# Patient Record
Sex: Male | Born: 1950
Health system: Southern US, Community
[De-identification: ages and names within clinical notes are randomized; demographics above are authoritative.]

## PROBLEM LIST (undated history)

## (undated) DIAGNOSIS — E785 Hyperlipidemia, unspecified: Secondary | ICD-10-CM

## (undated) DIAGNOSIS — Z973 Presence of spectacles and contact lenses: Secondary | ICD-10-CM

## (undated) DIAGNOSIS — K589 Irritable bowel syndrome without diarrhea: Secondary | ICD-10-CM

## (undated) DIAGNOSIS — K222 Esophageal obstruction: Secondary | ICD-10-CM

## (undated) DIAGNOSIS — K449 Diaphragmatic hernia without obstruction or gangrene: Secondary | ICD-10-CM

## (undated) DIAGNOSIS — I493 Ventricular premature depolarization: Secondary | ICD-10-CM

## (undated) DIAGNOSIS — R7303 Prediabetes: Secondary | ICD-10-CM

## (undated) DIAGNOSIS — K649 Unspecified hemorrhoids: Secondary | ICD-10-CM

## (undated) DIAGNOSIS — Z87442 Personal history of urinary calculi: Secondary | ICD-10-CM

## (undated) DIAGNOSIS — Z87898 Personal history of other specified conditions: Secondary | ICD-10-CM

## (undated) DIAGNOSIS — K219 Gastro-esophageal reflux disease without esophagitis: Secondary | ICD-10-CM

## (undated) DIAGNOSIS — H269 Unspecified cataract: Secondary | ICD-10-CM

## (undated) DIAGNOSIS — Z91018 Allergy to other foods: Secondary | ICD-10-CM

## (undated) DIAGNOSIS — I4891 Unspecified atrial fibrillation: Secondary | ICD-10-CM

## (undated) DIAGNOSIS — Z8719 Personal history of other diseases of the digestive system: Secondary | ICD-10-CM

## (undated) DIAGNOSIS — K5730 Diverticulosis of large intestine without perforation or abscess without bleeding: Secondary | ICD-10-CM

## (undated) DIAGNOSIS — I2510 Atherosclerotic heart disease of native coronary artery without angina pectoris: Secondary | ICD-10-CM

## (undated) DIAGNOSIS — Z7901 Long term (current) use of anticoagulants: Secondary | ICD-10-CM

## (undated) DIAGNOSIS — K5790 Diverticulosis of intestine, part unspecified, without perforation or abscess without bleeding: Secondary | ICD-10-CM

## (undated) HISTORY — PX: COLONOSCOPY: SHX174

## (undated) HISTORY — DX: Irritable bowel syndrome, unspecified: K58.9

## (undated) HISTORY — DX: Unspecified hemorrhoids: K64.9

## (undated) HISTORY — DX: Diaphragmatic hernia without obstruction or gangrene: K44.9

## (undated) HISTORY — DX: Unspecified cataract: H26.9

## (undated) HISTORY — PX: UPPER GASTROINTESTINAL ENDOSCOPY: SHX188

## (undated) HISTORY — DX: Ventricular premature depolarization: I49.3

## (undated) HISTORY — DX: Atherosclerotic heart disease of native coronary artery without angina pectoris: I25.10

## (undated) HISTORY — DX: Esophageal obstruction: K22.2

## (undated) HISTORY — PX: HERNIA REPAIR: SHX51

## (undated) HISTORY — DX: Gastro-esophageal reflux disease without esophagitis: K21.9

## (undated) HISTORY — DX: Prediabetes: R73.03

## (undated) HISTORY — PX: TONSILLECTOMY: SUR1361

## (undated) HISTORY — DX: Hyperlipidemia, unspecified: E78.5

## (undated) HISTORY — PX: ESOPHAGOGASTRODUODENOSCOPY (EGD) WITH ESOPHAGEAL DILATION: SHX5812

## (undated) HISTORY — PX: SURGERY SCROTAL / TESTICULAR: SUR1316

## (undated) HISTORY — DX: Diverticulosis of intestine, part unspecified, without perforation or abscess without bleeding: K57.90

## (undated) HISTORY — PX: HEMORRHOID BANDING: SHX5850

## (undated) HISTORY — PX: CYSTOSCOPY WITH STENT PLACEMENT: SHX5790

---
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
## 1999-11-21 ENCOUNTER — Encounter: Admission: RE | Admit: 1999-11-21 | Discharge: 2000-02-19 | Payer: Self-pay | Admitting: *Deleted

## 2001-02-17 ENCOUNTER — Encounter: Admission: RE | Admit: 2001-02-17 | Discharge: 2001-02-17 | Payer: Self-pay | Admitting: Gastroenterology

## 2001-02-17 ENCOUNTER — Encounter: Payer: Self-pay | Admitting: Gastroenterology

## 2001-03-30 ENCOUNTER — Ambulatory Visit (HOSPITAL_COMMUNITY): Admission: RE | Admit: 2001-03-30 | Discharge: 2001-03-30 | Payer: Self-pay | Admitting: Internal Medicine

## 2002-05-11 ENCOUNTER — Ambulatory Visit (HOSPITAL_COMMUNITY): Admission: RE | Admit: 2002-05-11 | Discharge: 2002-05-11 | Payer: Self-pay | Admitting: Gastroenterology

## 2002-05-11 ENCOUNTER — Encounter: Payer: Self-pay | Admitting: Gastroenterology

## 2003-07-12 ENCOUNTER — Ambulatory Visit (HOSPITAL_COMMUNITY): Admission: RE | Admit: 2003-07-12 | Discharge: 2003-07-12 | Payer: Self-pay | Admitting: Internal Medicine

## 2004-07-12 ENCOUNTER — Encounter (INDEPENDENT_AMBULATORY_CARE_PROVIDER_SITE_OTHER): Payer: Self-pay | Admitting: Specialist

## 2004-07-12 ENCOUNTER — Encounter (INDEPENDENT_AMBULATORY_CARE_PROVIDER_SITE_OTHER): Payer: Self-pay | Admitting: Gastroenterology

## 2004-07-12 ENCOUNTER — Ambulatory Visit (HOSPITAL_COMMUNITY): Admission: RE | Admit: 2004-07-12 | Discharge: 2004-07-12 | Payer: Self-pay | Admitting: Gastroenterology

## 2006-09-24 ENCOUNTER — Ambulatory Visit: Payer: Self-pay | Admitting: Gastroenterology

## 2008-08-04 DIAGNOSIS — K589 Irritable bowel syndrome without diarrhea: Secondary | ICD-10-CM | POA: Insufficient documentation

## 2008-08-04 DIAGNOSIS — K449 Diaphragmatic hernia without obstruction or gangrene: Secondary | ICD-10-CM | POA: Insufficient documentation

## 2008-08-04 DIAGNOSIS — K219 Gastro-esophageal reflux disease without esophagitis: Secondary | ICD-10-CM

## 2008-08-04 DIAGNOSIS — K5730 Diverticulosis of large intestine without perforation or abscess without bleeding: Secondary | ICD-10-CM | POA: Insufficient documentation

## 2008-08-04 DIAGNOSIS — K644 Residual hemorrhoidal skin tags: Secondary | ICD-10-CM | POA: Insufficient documentation

## 2008-08-04 DIAGNOSIS — F341 Dysthymic disorder: Secondary | ICD-10-CM

## 2008-08-09 ENCOUNTER — Ambulatory Visit: Payer: Self-pay | Admitting: Internal Medicine

## 2008-08-09 DIAGNOSIS — R142 Eructation: Secondary | ICD-10-CM

## 2008-08-09 DIAGNOSIS — R143 Flatulence: Secondary | ICD-10-CM

## 2008-08-09 DIAGNOSIS — R141 Gas pain: Secondary | ICD-10-CM | POA: Insufficient documentation

## 2011-03-01 NOTE — Assessment & Plan Note (Signed)
[facility]                         GASTROENTEROLOGY OFFICE NOTE   NAME:Kintzel, TYLLER BOWLBY                    MRN:          [id number]  DATE:09/24/2006                            DOB:          [birth year]    Travis [facility] comes in, basically describing some increasing constipation. He  has a known extremely tortuous, redundant colon. He has had this problem  for a long time. He talked about pain and gas up in his chest, mainly  when he eats. He denies it when he exerts himself, does not do any  exercise, but says if he walks upstairs it really does not make it  worse. I was telling him I was concerned about cardiac disease, but he  has cardiac workups in the past with adenosine scans and so on in the  past several years and we really have not detected anything significant.  He has had in the past, irritable bowel syndrome, with known food  allergies that he has had for years and he stays away from a number of  these foods. He has also has known GERD, and he has a lot of underlying  anxiety and depression.   PHYSICAL EXAMINATION:  GENERAL: He looks about the same.  VITAL SIGNS: Weighs 163, blood pressure 112/68.  Oral pharynx was negative.  NECK: Negative.  CHEST: Clear.  HEART: Revealed a regular rate and rhythm.  ABDOMEN: Flat, soft, no masses or organomegaly, nontender.  EXTREMITIES: Unremarkable.  RECTAL: Differed.   IMPRESSION:  1. Irritable bowel with constipation, which is probably causing much      of his symptomatology.  2. Multiple food allergies.  3. GERD.  4. Anxiety, depression.   RECOMMENDATIONS:  Have him to take Senokot with senna 2 pills at  bedtime, Analpram HC cream for his hemorrhoids, MiraLax as needed.  Prunes in the morning. I gave him some Xifaxan, to take one daily for 6  days. Probiotics, which was Flora-Q. I was going to do an ultrasound,  but this was done several years ago and was negative. I told him to look  up  constipation problems and how to treat it progressively on the  Internet. I told him to drink lots of fluids if he needs a boost to help  his nutrition, and I told him how to drink this in detail. Hopefully,  all these measures will be helpful to him. If the symptoms do not abate,  I told him to give me a call. I even told him he could call me at home  if need be.     Ulyess Mort, MD  Electronically Signed    SML/MedQ  DD: 09/24/2006  DT: 09/25/2006  Job #: [id number]

## 2011-11-24 ENCOUNTER — Encounter: Payer: Self-pay | Admitting: Family Medicine

## 2011-11-24 ENCOUNTER — Ambulatory Visit (INDEPENDENT_AMBULATORY_CARE_PROVIDER_SITE_OTHER): Payer: 59 | Admitting: Family Medicine

## 2011-11-24 VITALS — BP 133/79 | HR 80 | Temp 97.8°F | Resp 16 | Ht 70.5 in | Wt 175.0 lb

## 2011-11-24 DIAGNOSIS — J329 Chronic sinusitis, unspecified: Secondary | ICD-10-CM

## 2011-11-24 DIAGNOSIS — J019 Acute sinusitis, unspecified: Secondary | ICD-10-CM

## 2011-11-24 MED ORDER — AMOXICILLIN 875 MG PO TABS: 875.0000 mg | ORAL_TABLET | Freq: Two times a day (BID) | ORAL | Status: AC

## 2011-11-24 NOTE — Progress Notes (Signed)
  Subjective:    Patient ID: [facility], male    DOB: 12/26/1950, 61 y.o.   MRN: [id number]  Sinusitis This is a new problem. The current episode started 1 to 4 weeks ago. The problem has been gradually worsening since onset. There has been no fever. He is experiencing no pain. Associated symptoms include congestion, headaches and sinus pressure. Pertinent negatives include no coughing, ear pain, hoarse voice or neck pain.      Review of Systems  HENT: Positive for congestion and sinus pressure. Negative for ear pain, hoarse voice and neck pain.   Respiratory: Negative for cough.   Neurological: Positive for headaches.  All other systems reviewed and are negative.       Objective:   Physical Exam  Constitutional: He appears well-developed and well-nourished.  HENT:  Head: Normocephalic and atraumatic.  Right Ear: External ear normal.  Left Ear: External ear normal.  Mouth/Throat: Oropharynx is clear and moist. Oropharyngeal exudate: mucopurulent nasal d/c.  Eyes: Conjunctivae are normal. Pupils are equal, round, and reactive to light.  Neck: Normal range of motion. Neck supple.  Cardiovascular: Normal rate, regular rhythm and normal heart sounds.   Pulmonary/Chest: Effort normal.  Skin: Skin is warm and dry.  Psychiatric: He has a normal mood and affect. His behavior is normal.          Assessment & Plan:  Acute sinusitis > 2 weeks

## 2014-04-06 ENCOUNTER — Encounter: Payer: Self-pay | Admitting: Cardiology

## 2014-10-17 ENCOUNTER — Other Ambulatory Visit: Payer: Self-pay | Admitting: Dermatology

## 2014-12-14 ENCOUNTER — Encounter: Payer: Self-pay | Admitting: Internal Medicine

## 2015-02-02 ENCOUNTER — Ambulatory Visit (INDEPENDENT_AMBULATORY_CARE_PROVIDER_SITE_OTHER): Payer: Medicare Other | Admitting: Internal Medicine

## 2015-02-02 ENCOUNTER — Encounter: Payer: Self-pay | Admitting: Internal Medicine

## 2015-02-02 VITALS — BP 132/74 | HR 80 | Ht 70.0 in | Wt 189.0 lb

## 2015-02-02 DIAGNOSIS — K222 Esophageal obstruction: Secondary | ICD-10-CM

## 2015-02-02 DIAGNOSIS — Z1211 Encounter for screening for malignant neoplasm of colon: Secondary | ICD-10-CM

## 2015-02-02 DIAGNOSIS — K648 Other hemorrhoids: Secondary | ICD-10-CM | POA: Diagnosis not present

## 2015-02-02 DIAGNOSIS — R1310 Dysphagia, unspecified: Secondary | ICD-10-CM

## 2015-02-02 DIAGNOSIS — K5901 Slow transit constipation: Secondary | ICD-10-CM

## 2015-02-02 DIAGNOSIS — K219 Gastro-esophageal reflux disease without esophagitis: Secondary | ICD-10-CM

## 2015-02-02 DIAGNOSIS — K644 Residual hemorrhoidal skin tags: Secondary | ICD-10-CM

## 2015-02-02 MED ORDER — MOVIPREP 100 G PO SOLR: 1.0000 | Freq: Once | ORAL | Status: DC

## 2015-02-02 NOTE — Progress Notes (Signed)
HISTORY OF PRESENT ILLNESS:  Travis Jacobs is a 64 y.o. male with a history of GERD complicated by peptic stricture requiring esophageal dilation, irritable bowel syndrome with a tendency toward constipation, multiple food allergies, and anxiety/depression. The patient was last seen in this office 08/09/2008 to establish care after Dr. Franki Cabot retirement. He presents today to have multiple issues addressed. First, the need for repeat screening colonoscopy. The patient's last colonoscopy was performed 07/12/2004. The examination revealed moderate left-sided diverticulosis and external hemorrhoids. A diminutive hyperplastic sigmoid colon polyp was removed. There has been no interval family history of colon cancer. Next, the patient complains of constipation and daily difficulties with hemorrhoids. For constipation, MiraLAX had been recommended by his PCP that he rarely takes this agent. There is no associated abdominal pain. Terms of hemorrhoids, does report swelling and itching with occasional minor bleeding. He also inquires about different therapies for hemorrhoids including internal banding. Next, the patient has chronic GERD for which she takes omeprazole 20 mg daily. He obtains this from [facility]. Off medication significant reflux symptoms. On medication classic symptoms are controlled. He has had chronic intermittent solid food dysphagia item such as bread. This has worsened over the past year. He has had gradual weight gain over the years. He did undergo upper endoscopy with esophageal dilation to 52 French 07/12/2004. Small hiatal hernia present. Esophageal stricture present. He states this helped.  REVIEW OF SYSTEMS:  All non-GI ROS negative except for  Past Medical History  Diagnosis Date  . GERD (gastroesophageal reflux disease)   . Esophageal stricture   . Hemorrhoids   . Hiatal hernia   . Diverticulosis   . Anxiety and depression   . IBS (irritable bowel syndrome)   .  Hyperlipidemia     Past Surgical History  Procedure Laterality Date  . Hernia mesh removal      Social History [facility]  reports that he has quit smoking. He does not have any smokeless tobacco history on file. He reports that he drinks alcohol. He reports that he does not use illicit drugs.  family history includes Heart disease in his father and mother.  Allergies  Allergen Reactions  . Erythromycin   . Iodine   . Sulfonamide Derivatives        PHYSICAL EXAMINATION: Vital signs: BP 132/74 mmHg  Pulse 80  Ht '5\' 10"'  (1.778 m)  Wt 189 lb (85.73 kg)  BMI 27.12 kg/m2  Constitutional: generally well-appearing, no acute distress Psychiatric: alert and oriented x3, cooperative Eyes: extraocular movements intact, anicteric, conjunctiva pink Mouth: oral pharynx moist, no lesions Neck: supple no lymphadenopathy Cardiovascular: heart regular rate and rhythm, no murmur Lungs: clear to auscultation bilaterally Abdomen: soft, nontender, nondistended, no obvious ascites, no peritoneal signs, normal bowel sounds, no organomegaly Rectal: Deferred until colonoscopy Extremities: no lower extremity edema bilaterally Skin: no lesions on visible extremities Neuro: No focal deficits. Normal deep tendon reflexes  ASSESSMENT:  #1. Chronic GERD. Requires chronic PPI therapy for control of symptoms. #2. Chronic intermittent solid food dysphagia, progressive. Likely recurrent peptic stricture. #3. Functional constipation. Ongoing problem #4. Symptomatic hemorrhoids. Question external versus internal. Appropriate therapies to be determined #5. Colon cancer screening. Index colonoscopy 2005 with diverticulosis and external hemorrhoids. Appropriate candidate without contraindication   PLAN:  1. Reflux precautions 2. Continue PPI to control symptoms. We also discussed risk-benefit ratio of PPI therapy is understood at this point in time 3. Upper endoscopy with probable esophageal  dilation.The nature of the procedure, as  well as the risks, benefits, and alternatives were carefully and thoroughly reviewed with the patient. Ample time for discussion and questions allowed. The patient understood, was satisfied, and agreed to proceed. 4. Screening colonoscopy with polypectomy if needed.The nature of the procedure, as well as the risks, benefits, and alternatives were carefully and thoroughly reviewed with the patient. Ample time for discussion and questions allowed. The patient understood, was satisfied, and agreed to proceed. 5. Encouraged to use MiraLAX to facilitate bowel movements. As well, several doses prior to initiating colonoscopy prep as he had difficulty last time with results per his report 6. Appropriate recommendations for hemorrhoid therapy be determined post colonoscopy 7. Ongoing general medical care with Dr. Nyoka Cowden  40 minutes was spent with the patient face to face. Greater than half this time spent counseling him regarding the myriad of GI problems listed above

## 2015-02-02 NOTE — Patient Instructions (Signed)
You have been scheduled for an endoscopy and colonoscopy. Please follow the written instructions given to you at your visit today. Please pick up your prep supplies at the pharmacy within the next 1-3 days. If you use inhalers (even only as needed), please bring them with you on the day of your procedure.    As discussed with Dr. Marina GoodellPerry, use Miralax the morning before the procedure.

## 2015-04-11 ENCOUNTER — Encounter: Payer: Self-pay | Admitting: Internal Medicine

## 2015-04-11 ENCOUNTER — Ambulatory Visit (AMBULATORY_SURGERY_CENTER): Payer: Medicare Other | Admitting: Internal Medicine

## 2015-04-11 ENCOUNTER — Telehealth: Payer: Self-pay

## 2015-04-11 VITALS — BP 120/73 | HR 83 | Temp 97.8°F | Resp 32 | Ht 70.0 in | Wt 189.0 lb

## 2015-04-11 DIAGNOSIS — Z1211 Encounter for screening for malignant neoplasm of colon: Secondary | ICD-10-CM | POA: Diagnosis not present

## 2015-04-11 DIAGNOSIS — R1310 Dysphagia, unspecified: Secondary | ICD-10-CM

## 2015-04-11 DIAGNOSIS — K2100 Gastro-esophageal reflux disease with esophagitis, without bleeding: Secondary | ICD-10-CM

## 2015-04-11 DIAGNOSIS — K222 Esophageal obstruction: Secondary | ICD-10-CM | POA: Diagnosis not present

## 2015-04-11 MED ORDER — SODIUM CHLORIDE 0.9 % IV SOLN: 500.0000 mL | INTRAVENOUS | Status: DC

## 2015-04-11 MED ORDER — HYDROCORTISONE 2.5 % RE CREA: 1.0000 "application " | TOPICAL_CREAM | Freq: Two times a day (BID) | RECTAL | Status: DC

## 2015-04-11 NOTE — Op Note (Signed)
[facility] [address] [place], [id number]   ENDOSCOPY PROCEDURE REPORT  PATIENT: [name], Travis W  MR#: [id number] BIRTHDATE: [birth year] , 64  yrs. old GENDER: male ENDOSCOPIST: Roxy CedarJohn N Perry Jr, MD REFERRED BY:  .  Self / Office PROCEDURE DATE:  04/11/2015 PROCEDURE:  EGD, diagnostic and Maloney dilation of esophagus   - 3655f ASA CLASS:     Class II INDICATIONS:  dysphagia and history of esophageal reflux. MEDICATIONS: Monitored anesthesia care and Propofol 100 mg IV TOPICAL ANESTHETIC: none  DESCRIPTION OF PROCEDURE: After the risks benefits and alternatives of the procedure were thoroughly explained, informed consent was obtained.  The LB [id number] endoscope was introduced through the mouth and advanced to the second portion of the duodenum , Without limitations.  The instrument was slowly withdrawn as the mucosa was fully examined.   EXAM:The esophagus revealed a 15 mm ringlike stricture at the gastroesophageal junction with active esophagitis as manifested by edema, erythema, friability, and small erosions.  The stomach was normal.  The duodenum was normal.  Retroflexed views revealed a hiatal hernia.     The scope was then withdrawn from the patient and the procedure completed. THERAPY: 54 French Maloney dilator was passed into the esophagus without resistance or heme. Tolerated well  COMPLICATIONS: There were no immediate complications.  ENDOSCOPIC IMPRESSION: 1. GERD with active esophagitis despite daily PPI 2. Symptomatic esophageal stricture status post dilation to 54 [place] Maloney  RECOMMENDATIONS: 1.  Clear liquids until noon , then soft foods rest of day.  Resume prior diet tomorrow. 2.  Increase your dose of omeprazole daily (take twice current dose) to address active esophagitis and stricture 3. Return to the care of Dr. Chilton SiGreen. Contact Dr. Marina GoodellPerry if you have any further swallowing issues  REPEAT EXAM:  eSigned:  Roxy CedarJohn N  Perry Jr, MD 04/11/2015 9:51 AM    CC:The Patient and Nila NephewEdwin Green, MD

## 2015-04-11 NOTE — Progress Notes (Signed)
Called to room to assist during endoscopic procedure.  Patient ID and intended procedure confirmed with present staff. Received instructions for my participation in the procedure from the performing physician.  

## 2015-04-11 NOTE — Progress Notes (Signed)
Travis Monday, CRNA was advised of pt's GI upset when he eats soy food products.  The pt and CRNA conversed about this and decided propofol would be administered for the procedures. maw

## 2015-04-11 NOTE — Telephone Encounter (Signed)
Per procedure report patient needs surgical referral to [facility] for hemorrhoids.  He is scheduled for 04/25/15 to arrive at 1:20.  He is advised of the appt date and times

## 2015-04-11 NOTE — Progress Notes (Signed)
Report to PACU, RN, vss, BBS= Clear.  

## 2015-04-11 NOTE — Op Note (Signed)
[facility] [address] [place], [id number]   COLONOSCOPY PROCEDURE REPORT  PATIENT: Travis PardonKriegsman, Rakin W  MR#: [id number] BIRTHDATE: 1951-03-02 , 64  yrs. old GENDER: male ENDOSCOPIST: Roxy CedarJohn N Perry Jr, MD REFERRED XB:JYNWGNFAOBY:Screening Recall, PROCEDURE DATE:  04/11/2015 PROCEDURE:   Colonoscopy, screening First Screening Colonoscopy - Avg.  risk and is 50 yrs.  old or older - No.  Prior Negative Screening - Now for repeat screening. 10 or more years since last screening  History of Adenoma - Now for follow-up colonoscopy & has been > or = to 3 yrs.  N/A  Polyps removed today? No Recommend repeat exam, <10 yrs? No ASA CLASS:   Class II INDICATIONS:Screening for colonic neoplasia and Colorectal Neoplasm Risk Assessment for this procedure is average risk. Index exam 2005 [facility](SML) negative- hpp. MEDICATIONS: Monitored anesthesia care, Propofol 300 mg IV, and Lidocaine 150 mg IV  DESCRIPTION OF PROCEDURE:   After the risks benefits and alternatives of the procedure were thoroughly explained, informed consent was obtained.  The digital rectal exam revealed prolapsing internal as well as external hemorrhoids.   The LB [id number] endoscope was introduced through the anus and advanced to the cecum, which was identified by both the appendix and ileocecal valve. No adverse events experienced.   The quality of the prep was excellent.  (MoviPrep was used)  The instrument was then slowly withdrawn as the colon was fully examined. Estimated blood loss is zero unless otherwise noted in this procedure report.   COLON FINDINGS: There was mild diverticulosis noted in the sigmoid colon.   The examination was otherwise normal. No polyps, inflammation, or other abnormalities.  Retroflexed views revealed internal Grade IV hemorrhoids. The time to cecum = 5.5 Withdrawal time = 11.5   The scope was withdrawn and the procedure completed.  COMPLICATIONS: There were no immediate  complications.  ENDOSCOPIC IMPRESSION: 1.   Mild diverticulosis was noted in the sigmoid colon 2.   The examination was otherwise normal  RECOMMENDATIONS: 1. Initiate Metamucil 2 tablespoons daily in water or juice 2. Prescribe Anusol HC medicated suppositories. #30; 6 refills; 1 PR daily at bedtime 3. Would recommend general surgical evaluation regarding advanced hemorrhoids 4. Continue current colorectal screening recommendations for "routine risk" patients with a repeat colonoscopy in 10 years. 5. EGD today. Please see results  eSigned:  Roxy CedarJohn N Perry Jr, MD 04/11/2015 9:44 AM   cc: The Patient and Nila NephewEdwin Green, MD

## 2015-04-11 NOTE — Progress Notes (Signed)
The states that soy food products cause gi upset.  I advised the pt to discuss this with the CRNA in the procedure room once he gets in the room.  Pt states he will. maw

## 2015-04-11 NOTE — Patient Instructions (Addendum)
YOU HAD AN ENDOSCOPIC PROCEDURE TODAY AT THE [facility]:   Refer to the procedure report that was given to you for any specific questions about what was found during the examination.  If the procedure report does not answer your questions, please call your gastroenterologist to clarify.  If you requested that your care partner not be given the details of your procedure findings, then the procedure report has been included in a sealed envelope for you to review at your convenience later.  YOU SHOULD EXPECT: Some feelings of bloating in the abdomen. Passage of more gas than usual.  Walking can help get rid of the air that was put into your GI tract during the procedure and reduce the bloating. If you had a lower endoscopy (such as a colonoscopy or flexible sigmoidoscopy) you may notice spotting of blood in your stool or on the toilet paper. If you underwent a bowel prep for your procedure, you may not have a normal bowel movement for a few days.  Please Note:  You might notice some irritation and congestion in your nose or some drainage.  This is from the oxygen used during your procedure.  There is no need for concern and it should clear up in a day or so.  SYMPTOMS TO REPORT IMMEDIATELY:   Following lower endoscopy (colonoscopy or flexible sigmoidoscopy):  Excessive amounts of blood in the stool  Significant tenderness or worsening of abdominal pains  Swelling of the abdomen that is new, acute  Fever of 100F or higher   Following upper endoscopy (EGD)  Vomiting of blood or coffee ground material  New chest pain or pain under the shoulder blades  Painful or persistently difficult swallowing  New shortness of breath  Fever of 100F or higher  Black, tarry-looking stools  For urgent or emergent issues, a gastroenterologist can be reached at any hour by calling [phone].   DIET: See Dilation Diet  ACTIVITY:  You should plan to take it easy for the rest of today and you  should NOT DRIVE or use heavy machinery until tomorrow (because of the sedation medicines used during the test).    FOLLOW UP: Our staff will call the number listed on your records the next business day following your procedure to check on you and address any questions or concerns that you may have regarding the information given to you following your procedure. If we do not reach you, we will leave a message.  However, if you are feeling well and you are not experiencing any problems, there is no need to return our call.  We will assume that you have returned to your regular daily activities without incident.  If any biopsies were taken you will be contacted by phone or by letter within the next 1-3 weeks.  Please call us at [phone] if you have not heard about the biopsies in 3 weeks.    SIGNATURES/CONFIDENTIALITY: You and/or your care partner have signed paperwork which will be entered into your electronic medical record.  These signatures attest to the fact that that the information above on your After Visit Summary has been reviewed and is understood.  Full responsibility of the confidentiality of this discharge information lies with you and/or your care-partner.  Metamucil 2 tablespoons daily in water or juice Anusol HC suppositories 1 as needed daily at bedtime Recommend general surgery regarding hemorrhoids Repeat Colonoscopy in 10 years  Clear liquids until noon, then soft for the rest of the day  Increase Omeprazole to twice a day

## 2015-04-12 ENCOUNTER — Telehealth: Payer: Self-pay | Admitting: *Deleted

## 2015-04-12 DIAGNOSIS — K649 Unspecified hemorrhoids: Secondary | ICD-10-CM

## 2015-04-12 MED ORDER — HYDROCORTISONE ACETATE 25 MG RE SUPP
25.0000 mg | Freq: Every day | RECTAL | Status: DC
Start: 2015-04-12 — End: 2016-02-28

## 2015-04-12 NOTE — Telephone Encounter (Signed)
Halobetasol in am ok. Stop diltizem

## 2015-04-12 NOTE — Telephone Encounter (Signed)
  Follow up Call-  Call back number 04/11/2015  Post procedure Call Back phone  # [phone]  Permission to leave phone message Yes     Patient questions:  Do you have a fever, pain , or abdominal swelling? No. Pain Score  0 *  Have you tolerated food without any problems? Yes.    Have you been able to return to your normal activities? Yes.    Do you have any questions about your discharge instructions: Diet   No. Medications  yes Follow up visit  No.  Do you have questions or concerns about your Care? No.  Actions: * If pain score is 4 or above:  Pt states anusol HC was sent in as cream instead of suppositories, advised pt I would send in correct prescription as Dr Marina GoodellPerry [facility]ol [facility] supp[facility]itories as needed at bedtime #30 with 6 refills.  Pt also has questions about medications, pt wants to know if he should continue to use the halobetasol cream that he uses in the morning (not twice a day as in medication list) and does he continue the diltiazem gel every night as well, if he does not use the halobetasol when using the anusol supp then does he use the halobetasol? Pls advise-adm

## 2015-04-12 NOTE — Telephone Encounter (Signed)
Called pt and advised of Dr Lamar SprinklesPerry's instructions, use anusol Hc supp every night, stop diltiazem gel and he can continue to use the halobetasol cream in the am, pt verbalizes understanding-adm

## 2016-02-05 ENCOUNTER — Other Ambulatory Visit (HOSPITAL_COMMUNITY): Payer: Self-pay | Admitting: Internal Medicine

## 2016-02-05 DIAGNOSIS — I493 Ventricular premature depolarization: Secondary | ICD-10-CM

## 2016-02-08 ENCOUNTER — Encounter (HOSPITAL_COMMUNITY): Payer: Medicare Other

## 2016-02-12 DIAGNOSIS — I2510 Atherosclerotic heart disease of native coronary artery without angina pectoris: Secondary | ICD-10-CM

## 2016-02-12 HISTORY — DX: Atherosclerotic heart disease of native coronary artery without angina pectoris: I25.10

## 2016-02-13 ENCOUNTER — Encounter (HOSPITAL_COMMUNITY): Payer: Medicare Other

## 2016-02-20 ENCOUNTER — Other Ambulatory Visit (HOSPITAL_COMMUNITY): Payer: Medicare Other

## 2016-02-20 ENCOUNTER — Ambulatory Visit (HOSPITAL_COMMUNITY): Payer: Medicare Other

## 2016-02-27 ENCOUNTER — Inpatient Hospital Stay (HOSPITAL_COMMUNITY)
Admission: AD | Admit: 2016-02-27 | Discharge: 2016-02-29 | DRG: 247 | Disposition: A | Discharge status: Home / Self Care | Payer: Medicare Other | Source: Ambulatory Visit | Attending: Cardiovascular Disease | Admitting: Cardiovascular Disease

## 2016-02-27 ENCOUNTER — Ambulatory Visit (HOSPITAL_COMMUNITY)
Admission: RE | Admit: 2016-02-27 | Discharge: 2016-02-27 | Disposition: A | Discharge status: Home / Self Care | Payer: Medicare Other | Source: Ambulatory Visit | Attending: Internal Medicine | Admitting: Internal Medicine

## 2016-02-27 ENCOUNTER — Encounter (HOSPITAL_COMMUNITY): Payer: Self-pay | Admitting: General Practice

## 2016-02-27 ENCOUNTER — Observation Stay (HOSPITAL_COMMUNITY): Payer: Medicare Other

## 2016-02-27 DIAGNOSIS — Z955 Presence of coronary angioplasty implant and graft: Secondary | ICD-10-CM

## 2016-02-27 DIAGNOSIS — F418 Other specified anxiety disorders: Secondary | ICD-10-CM | POA: Diagnosis present

## 2016-02-27 DIAGNOSIS — R9439 Abnormal result of other cardiovascular function study: Secondary | ICD-10-CM | POA: Diagnosis not present

## 2016-02-27 DIAGNOSIS — Z888 Allergy status to other drugs, medicaments and biological substances status: Secondary | ICD-10-CM

## 2016-02-27 DIAGNOSIS — I25118 Atherosclerotic heart disease of native coronary artery with other forms of angina pectoris: Principal | ICD-10-CM | POA: Diagnosis present

## 2016-02-27 DIAGNOSIS — Z91013 Allergy to seafood: Secondary | ICD-10-CM

## 2016-02-27 DIAGNOSIS — I499 Cardiac arrhythmia, unspecified: Secondary | ICD-10-CM

## 2016-02-27 DIAGNOSIS — I498 Other specified cardiac arrhythmias: Secondary | ICD-10-CM | POA: Diagnosis present

## 2016-02-27 DIAGNOSIS — E785 Hyperlipidemia, unspecified: Secondary | ICD-10-CM | POA: Diagnosis present

## 2016-02-27 DIAGNOSIS — I493 Ventricular premature depolarization: Secondary | ICD-10-CM | POA: Diagnosis not present

## 2016-02-27 DIAGNOSIS — Z882 Allergy status to sulfonamides status: Secondary | ICD-10-CM

## 2016-02-27 DIAGNOSIS — I2510 Atherosclerotic heart disease of native coronary artery without angina pectoris: Secondary | ICD-10-CM

## 2016-02-27 DIAGNOSIS — Z886 Allergy status to analgesic agent status: Secondary | ICD-10-CM

## 2016-02-27 DIAGNOSIS — Z91041 Radiographic dye allergy status: Secondary | ICD-10-CM

## 2016-02-27 DIAGNOSIS — R008 Other abnormalities of heart beat: Secondary | ICD-10-CM | POA: Diagnosis present

## 2016-02-27 DIAGNOSIS — I208 Other forms of angina pectoris: Secondary | ICD-10-CM | POA: Insufficient documentation

## 2016-02-27 DIAGNOSIS — Z8249 Family history of ischemic heart disease and other diseases of the circulatory system: Secondary | ICD-10-CM

## 2016-02-27 DIAGNOSIS — Z881 Allergy status to other antibiotic agents status: Secondary | ICD-10-CM

## 2016-02-27 DIAGNOSIS — K219 Gastro-esophageal reflux disease without esophagitis: Secondary | ICD-10-CM | POA: Diagnosis present

## 2016-02-27 DIAGNOSIS — Z87891 Personal history of nicotine dependence: Secondary | ICD-10-CM

## 2016-02-27 DIAGNOSIS — K589 Irritable bowel syndrome without diarrhea: Secondary | ICD-10-CM | POA: Diagnosis present

## 2016-02-27 DIAGNOSIS — Z91018 Allergy to other foods: Secondary | ICD-10-CM

## 2016-02-27 HISTORY — DX: Allergy to other foods: Z91.018

## 2016-02-27 LAB — COMPREHENSIVE METABOLIC PANEL
ALT: 27 U/L (ref 17–63)
AST: 24 U/L (ref 15–41)
Albumin: 3.7 g/dL (ref 3.5–5.0)
Alkaline Phosphatase: 75 U/L (ref 38–126)
Anion gap: 10 (ref 5–15)
BILIRUBIN TOTAL: 0.7 mg/dL (ref 0.3–1.2)
BUN: 10 mg/dL (ref 6–20)
CHLORIDE: 106 mmol/L (ref 101–111)
CO2: 23 mmol/L (ref 22–32)
CREATININE: 0.9 mg/dL (ref 0.61–1.24)
Calcium: 10 mg/dL (ref 8.9–10.3)
GFR calc Af Amer: >60 mL/min (ref >60)
Glucose, Bld: 121 mg/dL — ABNORMAL HIGH (ref 65–99)
Potassium: 3.9 mmol/L (ref 3.5–5.1)
SODIUM: 139 mmol/L (ref 135–145)
Total Protein: 6.7 g/dL (ref 6.5–8.1)

## 2016-02-27 LAB — TROPONIN I: Troponin I: <0.03 ng/mL (ref <0.031)

## 2016-02-27 LAB — CBC WITH DIFFERENTIAL/PLATELET
BASOS PCT: 0 %
Basophils Absolute: 0 10*3/uL (ref 0.0–0.1)
Eosinophils Absolute: 0.1 10*3/uL (ref 0.0–0.7)
Eosinophils Relative: 1 %
HEMATOCRIT: 44.2 % (ref 39.0–52.0)
Hemoglobin: 14.9 g/dL (ref 13.0–17.0)
Lymphocytes Relative: 15 %
Lymphs Abs: 1.8 10*3/uL (ref 0.7–4.0)
MCH: 29.2 pg (ref 26.0–34.0)
MCHC: 33.7 g/dL (ref 30.0–36.0)
MCV: 86.5 fL (ref 78.0–100.0)
MONO ABS: 0.6 10*3/uL (ref 0.1–1.0)
MONOS PCT: 5 %
NEUTROS ABS: 9.7 10*3/uL — AB (ref 1.7–7.7)
Neutrophils Relative %: 79 %
Platelets: 288 10*3/uL (ref 150–400)
RBC: 5.11 MIL/uL (ref 4.22–5.81)
RDW: 13.1 % (ref 11.5–15.5)
WBC: 12.2 10*3/uL — ABNORMAL HIGH (ref 4.0–10.5)

## 2016-02-27 LAB — TSH: TSH: 1.368 u[IU]/mL (ref 0.350–4.500)

## 2016-02-27 LAB — PROTIME-INR
INR: 1.11 (ref 0.00–1.49)
Prothrombin Time: 14.5 seconds (ref 11.6–15.2)

## 2016-02-27 MED ORDER — ASPIRIN 300 MG RE SUPP
300.0000 mg | RECTAL | Status: AC
  Filled 2016-02-27: qty 1

## 2016-02-27 MED ORDER — SODIUM CHLORIDE 0.9% FLUSH: 3.0000 mL | Freq: Two times a day (BID) | INTRAVENOUS | Status: DC

## 2016-02-27 MED ORDER — PREDNISONE 20 MG PO TABS
60.0000 mg | ORAL_TABLET | ORAL | Status: AC
  Administered 2016-02-27: 18:00:00 60 mg via ORAL
  Filled 2016-02-27: qty 3

## 2016-02-27 MED ORDER — DIPHENHYDRAMINE HCL 50 MG/ML IJ SOLN
25.0000 mg | INTRAMUSCULAR | Status: AC
  Administered 2016-02-28: 25 mg via INTRAVENOUS
  Filled 2016-02-27: qty 1

## 2016-02-27 MED ORDER — FAMOTIDINE IN NACL 20-0.9 MG/50ML-% IV SOLN
20.0000 mg | INTRAVENOUS | Status: AC
Start: 2016-02-28 — End: 2016-02-28
  Administered 2016-02-28: 09:00:00 20 mg via INTRAVENOUS
  Filled 2016-02-27: qty 50

## 2016-02-27 MED ORDER — HEPARIN SODIUM (PORCINE) 5000 UNIT/ML IJ SOLN
5000.0000 [IU] | Freq: Three times a day (TID) | INTRAMUSCULAR | Status: DC
  Administered 2016-02-27 – 2016-02-29 (×6): 5000 [IU] via SUBCUTANEOUS
  Filled 2016-02-27 (×6): qty 1

## 2016-02-27 MED ORDER — ASPIRIN EC 81 MG PO TBEC
81.0000 mg | DELAYED_RELEASE_TABLET | Freq: Every day | ORAL | Status: DC
Start: 2016-02-29 — End: 2016-02-29
  Administered 2016-02-29: 09:00:00 81 mg via ORAL
  Filled 2016-02-27: qty 1

## 2016-02-27 MED ORDER — SODIUM CHLORIDE 0.9 % IV SOLN: 250.0000 mL | INTRAVENOUS | Status: DC | PRN

## 2016-02-27 MED ORDER — SODIUM CHLORIDE 0.9% FLUSH
3.0000 mL | INTRAVENOUS | Status: DC | PRN
  Administered 2016-02-27: 18:00:00 3 mL via INTRAVENOUS
  Filled 2016-02-27: qty 3

## 2016-02-27 MED ORDER — ATORVASTATIN CALCIUM 40 MG PO TABS
40.0000 mg | ORAL_TABLET | Freq: Every day | ORAL | Status: DC
  Administered 2016-02-27 – 2016-02-28 (×2): 40 mg via ORAL
  Filled 2016-02-27 (×2): qty 1

## 2016-02-27 MED ORDER — PREDNISONE 20 MG PO TABS
60.0000 mg | ORAL_TABLET | ORAL | Status: AC
  Administered 2016-02-28: 60 mg via ORAL
  Filled 2016-02-27: qty 3

## 2016-02-27 MED ORDER — NITROGLYCERIN 0.4 MG SL SUBL: 0.4000 mg | SUBLINGUAL_TABLET | SUBLINGUAL | Status: DC | PRN

## 2016-02-27 MED ORDER — ONDANSETRON HCL 4 MG/2ML IJ SOLN: 4.0000 mg | Freq: Four times a day (QID) | INTRAMUSCULAR | Status: DC | PRN

## 2016-02-27 MED ORDER — ACETAMINOPHEN 325 MG PO TABS: 650.0000 mg | ORAL_TABLET | ORAL | Status: DC | PRN

## 2016-02-27 MED ORDER — ASPIRIN 81 MG PO CHEW
324.0000 mg | CHEWABLE_TABLET | ORAL | Status: AC
  Administered 2016-02-27: 324 mg via ORAL
  Filled 2016-02-27: qty 4

## 2016-02-27 MED ORDER — SODIUM CHLORIDE 0.9 % WEIGHT BASED INFUSION
1.0000 mL/kg/h | INTRAVENOUS | Status: DC
  Administered 2016-02-27: 21:00:00 1 mL/kg/h via INTRAVENOUS

## 2016-02-27 MED ORDER — ASPIRIN EC 81 MG PO TBEC: 81.0000 mg | DELAYED_RELEASE_TABLET | Freq: Every day | ORAL | Status: DC

## 2016-02-27 MED ORDER — METOPROLOL TARTRATE 25 MG PO TABS
25.0000 mg | ORAL_TABLET | Freq: Two times a day (BID) | ORAL | Status: DC
Start: 2016-02-27 — End: 2016-02-29
  Administered 2016-02-27 – 2016-02-29 (×3): 25 mg via ORAL
  Filled 2016-02-27 (×3): qty 1

## 2016-02-27 MED ORDER — ASPIRIN 81 MG PO CHEW
81.0000 mg | CHEWABLE_TABLET | ORAL | Status: AC
  Administered 2016-02-28: 06:00:00 81 mg via ORAL
  Filled 2016-02-27: qty 1

## 2016-02-27 NOTE — Care Management Obs Status (Signed)
MEDICARE OBSERVATION STATUS NOTIFICATION   Patient Details  Name: Travis Jacobs MRN: [id number] Date of Birth: 02-04-1951   Medicare Observation Status Notification Given:  Yes    Leone Havenaylor, Jadyn Brasher Clinton, RN 02/27/2016, 5:33 PM

## 2016-02-27 NOTE — H&P (Signed)
Cardiologist: Travis Jacobs is an 65 y.o. male.   Chief Complaint: Abnormal stress test HPI:   Patient is a 65 year old male with a history of hyperlipidemia, GERD, esophageal stricture, anxiety depression, hemorrhoids.  Underwent exercise tolerance test today which was abnormal.  Patient reports he was at Dr. Thomasene Lot office today and ultimately underwent an exercise tolerance test which was abnormal. Patient reports he has some chest tightness but "extremely, extremely, extremely" mild.  He also reports shortness of breath at night and periodically wakes up feeling short of breath.  The patient currently denies nausea, vomiting, fever, dizziness, PND, cough, congestion, abdominal pain, hematochezia, melena, lower extremity edema, claudication.   Past Medical History  Diagnosis Date  . GERD (gastroesophageal reflux disease)   . Esophageal stricture   . Hemorrhoids   . Hiatal hernia   . Diverticulosis   . Anxiety and depression   . IBS (irritable bowel syndrome)   . Hyperlipidemia   . Allergy   . Cataract     bil cateracts    Past Surgical History  Procedure Laterality Date  . Hernia mesh removal    . Testicle hernia    . Colonoscopy    . Upper gastrointestinal endoscopy      Family History  Problem Relation Age of Onset  . Heart disease Mother   . Heart disease Father   . Colon cancer Neg Hx   . Esophageal cancer Neg Hx   . Rectal cancer Neg Hx   . Stomach cancer Neg Hx    Social History:  reports that he has quit smoking. He has never used smokeless tobacco. He reports that he drinks alcohol. He reports that he does not use illicit drugs.  Allergies:  Allergies  Allergen Reactions  . Aleve [Naproxen Sodium]   . Erythromycin Swelling    Mouth swells  . Shellfish Allergy Other (See Comments)    Not sure of reaction found out about allergy through [place]  . Sulfonamide Derivatives Swelling    Mouth swelling  . Iodine Rash    Medications Prior to  Admission  Medication Sig Dispense Refill  . atorvastatin (LIPITOR) 10 MG tablet Take 10 mg by mouth daily.     Marland Kitchen docusate sodium (COLACE) 100 MG capsule Take 500 mg by mouth daily.    . halobetasol (ULTRAVATE) 0.05 % cream Apply topically 2 (two) times daily.    Marland Kitchen omeprazole (PRILOSEC) 10 MG capsule Take 20 mg by mouth daily.     . Probiotic Product (PROBIOTIC DAILY PO) Take by mouth.    . triamcinolone (NASACORT ALLERGY 24HR) 55 MCG/ACT AERO nasal inhaler Place 2 sprays into the nose daily.    . cetirizine (ZYRTEC) 10 MG tablet Take 10 mg by mouth daily.    Marland Kitchen CIALIS 5 MG tablet Take 5 mg by mouth daily as needed for erectile dysfunction.   98  . diltiazem 2 % GEL Apply topically 1 day or 1 dose.    . hydrocortisone (ANUSOL-HC) 25 MG suppository Place 1 suppository (25 mg total) rectally at bedtime. (Patient not taking: Reported on 02/27/2016) 30 suppository 6    No results found for this or any previous visit (from the past 48 hour(s)). No results found.  Review of Systems  Constitutional: Positive for malaise/fatigue. Negative for fever, chills and diaphoresis.  HENT: Negative for sore throat.   Respiratory: Positive for shortness of breath. Negative for cough.   Cardiovascular: Positive for chest pain, orthopnea (occasional) and PND. Negative for  palpitations and leg swelling.  Gastrointestinal: Positive for blood in stool. Negative for nausea, vomiting, abdominal pain and melena.  Musculoskeletal: Negative for myalgias.  Neurological: Negative for dizziness.  All other systems reviewed and are negative.   Blood pressure 175/78, pulse 91, temperature 97.1 F (36.2 C), temperature source Oral, resp. rate 21, SpO2 100 %. Physical Exam  Constitutional: He is oriented to person, place, and time. He appears well-developed and well-nourished. No distress.  HENT:  Head: Normocephalic and atraumatic.  Mouth/Throat: No oropharyngeal exudate.  Eyes: EOM are normal. Pupils are equal,  round, and reactive to light. No scleral icterus.  Neck: Normal range of motion. Neck supple.  Cardiovascular: Normal rate, S1 normal and S2 normal.  An irregular rhythm present.  No murmur heard. Pulses:      Radial pulses are 2+ on the right side, and 2+ on the left side.       Dorsalis pedis pulses are 2+ on the right side, and 2+ on the left side.  Respiratory: Effort normal and breath sounds normal. He has no wheezes. He has no rales.  GI: Soft. Bowel sounds are normal. He exhibits no distension. There is no tenderness.  Musculoskeletal: He exhibits no edema.  Neurological: He is alert and oriented to person, place, and time. He exhibits normal muscle tone.  Skin: Skin is warm and dry.  Psychiatric: He has a normal mood and affect.     Assessment/Plan Principal Problem:   Abnormal stress test Active Problems:   Ventricular bigeminy  Patient presented after having an abnormal stress test. ST depression in anterolateral/inferior ST depression.  He will be scheduled for left heart catheterization tomorrow morning with Dr.Arida.  Add beta blocker, statin.  Check lipids, PT/INR, CXR, CBC, CMP  The patient understands that risks include but are not limited to stroke (1 in 1000), death (1 in 1000), kidney failure [usually temporary] (1 in 500), bleeding (1 in 200), allergic reaction [possibly serious] (1 in 200). The patient understands and is willing to proceed.   Contrast allergy.  Premedicate  Wilburt FinlayBryan Hager, PA-C 02/27/2016, 3:25 PM   I have personally seen and examined this patient with Wilburt FinlayBryan Hager, PA-C. I agree with the assessment and plan as outlined above. He has had minimal symptoms at home with exertion but does note dyspnea and chest pain at times with moderate exertion. Exercise stress test today by his primary care doctor with diffuse ST depression at 6 minutes. He is being admitted for cardiac cath. Exam is overall normal with thin male, RRR, no murmurs, lungs clear, no LE  edema. Labs will be checked. Will arrange echo. Cardiac cath in the am. Risks and benefits reviewed with pt. He is willing to proceed.   Verne CarrowChristopher Hulen Mandler 02/27/2016 4:09 PM

## 2016-02-28 ENCOUNTER — Encounter (HOSPITAL_COMMUNITY)
Admission: AD | Disposition: A | Discharge status: Home / Self Care | Payer: Self-pay | Source: Ambulatory Visit | Attending: Cardiovascular Disease

## 2016-02-28 ENCOUNTER — Encounter (HOSPITAL_COMMUNITY): Payer: Self-pay | Admitting: Cardiovascular Disease

## 2016-02-28 DIAGNOSIS — I25110 Atherosclerotic heart disease of native coronary artery with unstable angina pectoris: Secondary | ICD-10-CM | POA: Diagnosis not present

## 2016-02-28 DIAGNOSIS — Z91018 Allergy to other foods: Secondary | ICD-10-CM | POA: Diagnosis not present

## 2016-02-28 DIAGNOSIS — Z882 Allergy status to sulfonamides status: Secondary | ICD-10-CM | POA: Diagnosis not present

## 2016-02-28 DIAGNOSIS — Z91041 Radiographic dye allergy status: Secondary | ICD-10-CM | POA: Diagnosis not present

## 2016-02-28 DIAGNOSIS — K589 Irritable bowel syndrome without diarrhea: Secondary | ICD-10-CM | POA: Diagnosis present

## 2016-02-28 DIAGNOSIS — K219 Gastro-esophageal reflux disease without esophagitis: Secondary | ICD-10-CM | POA: Diagnosis present

## 2016-02-28 DIAGNOSIS — R9439 Abnormal result of other cardiovascular function study: Secondary | ICD-10-CM | POA: Diagnosis present

## 2016-02-28 DIAGNOSIS — I493 Ventricular premature depolarization: Secondary | ICD-10-CM | POA: Diagnosis not present

## 2016-02-28 DIAGNOSIS — Z881 Allergy status to other antibiotic agents status: Secondary | ICD-10-CM | POA: Diagnosis not present

## 2016-02-28 DIAGNOSIS — R008 Other abnormalities of heart beat: Secondary | ICD-10-CM | POA: Diagnosis present

## 2016-02-28 DIAGNOSIS — E785 Hyperlipidemia, unspecified: Secondary | ICD-10-CM | POA: Diagnosis present

## 2016-02-28 DIAGNOSIS — Z888 Allergy status to other drugs, medicaments and biological substances status: Secondary | ICD-10-CM | POA: Diagnosis not present

## 2016-02-28 DIAGNOSIS — I2089 Other forms of angina pectoris: Secondary | ICD-10-CM | POA: Insufficient documentation

## 2016-02-28 DIAGNOSIS — F418 Other specified anxiety disorders: Secondary | ICD-10-CM | POA: Diagnosis present

## 2016-02-28 DIAGNOSIS — Z91013 Allergy to seafood: Secondary | ICD-10-CM | POA: Diagnosis not present

## 2016-02-28 DIAGNOSIS — I208 Other forms of angina pectoris: Secondary | ICD-10-CM | POA: Insufficient documentation

## 2016-02-28 DIAGNOSIS — I25118 Atherosclerotic heart disease of native coronary artery with other forms of angina pectoris: Principal | ICD-10-CM

## 2016-02-28 DIAGNOSIS — Z955 Presence of coronary angioplasty implant and graft: Secondary | ICD-10-CM | POA: Diagnosis not present

## 2016-02-28 DIAGNOSIS — Z8249 Family history of ischemic heart disease and other diseases of the circulatory system: Secondary | ICD-10-CM | POA: Diagnosis not present

## 2016-02-28 DIAGNOSIS — Z886 Allergy status to analgesic agent status: Secondary | ICD-10-CM | POA: Diagnosis not present

## 2016-02-28 DIAGNOSIS — Z87891 Personal history of nicotine dependence: Secondary | ICD-10-CM | POA: Diagnosis not present

## 2016-02-28 HISTORY — PX: CARDIAC CATHETERIZATION: SHX172

## 2016-02-28 LAB — BASIC METABOLIC PANEL
Anion gap: 10 (ref 5–15)
BUN: 11 mg/dL (ref 6–20)
CO2: 22 mmol/L (ref 22–32)
CREATININE: 0.79 mg/dL (ref 0.61–1.24)
Calcium: 9.4 mg/dL (ref 8.9–10.3)
Chloride: 108 mmol/L (ref 101–111)
GFR calc Af Amer: >60 mL/min (ref >60)
Glucose, Bld: 137 mg/dL — ABNORMAL HIGH (ref 65–99)
Potassium: 4.4 mmol/L (ref 3.5–5.1)
SODIUM: 140 mmol/L (ref 135–145)

## 2016-02-28 LAB — TROPONIN I

## 2016-02-28 LAB — HEMOGLOBIN A1C
HEMOGLOBIN A1C: 6.1 % — AB (ref 4.8–5.6)
MEAN PLASMA GLUCOSE: 128 mg/dL

## 2016-02-28 LAB — LIPID PANEL
CHOLESTEROL: 177 mg/dL (ref 0–200)
HDL: 39 mg/dL — ABNORMAL LOW (ref >40)
LDL Cholesterol: 110 mg/dL — ABNORMAL HIGH (ref 0–99)
Total CHOL/HDL Ratio: 4.5 RATIO
Triglycerides: 138 mg/dL (ref <150)
VLDL: 28 mg/dL (ref 0–40)

## 2016-02-28 LAB — POCT ACTIVATED CLOTTING TIME
ACTIVATED CLOTTING TIME: 240 s
Activated Clotting Time: 224 seconds

## 2016-02-28 SURGERY — LEFT HEART CATH AND CORONARY ANGIOGRAPHY
Anesthesia: LOCAL

## 2016-02-28 MED ORDER — SODIUM CHLORIDE 0.9% FLUSH: 3.0000 mL | INTRAVENOUS | Status: DC | PRN

## 2016-02-28 MED ORDER — VERAPAMIL HCL 2.5 MG/ML IV SOLN
INTRAVENOUS | Status: DC | PRN
  Administered 2016-02-28: 10 mL via INTRA_ARTERIAL

## 2016-02-28 MED ORDER — SODIUM CHLORIDE 0.9% FLUSH
3.0000 mL | Freq: Two times a day (BID) | INTRAVENOUS | Status: DC
  Administered 2016-02-28 (×2): 3 mL via INTRAVENOUS

## 2016-02-28 MED ORDER — SODIUM CHLORIDE 0.9 % IV SOLN: 250.0000 mL | INTRAVENOUS | Status: DC | PRN

## 2016-02-28 MED ORDER — IOPAMIDOL (ISOVUE-370) INJECTION 76%
INTRAVENOUS | Status: DC | PRN
  Administered 2016-02-28: 145 mL

## 2016-02-28 MED ORDER — CLOPIDOGREL BISULFATE 300 MG PO TABS
ORAL_TABLET | ORAL | Status: AC
  Filled 2016-02-28: qty 2

## 2016-02-28 MED ORDER — HEPARIN SODIUM (PORCINE) 1000 UNIT/ML IJ SOLN
INTRAMUSCULAR | Status: AC
  Filled 2016-02-28: qty 1

## 2016-02-28 MED ORDER — CLOPIDOGREL BISULFATE 300 MG PO TABS
ORAL_TABLET | ORAL | Status: DC | PRN
  Administered 2016-02-28: 600 mg via ORAL

## 2016-02-28 MED ORDER — HEPARIN (PORCINE) IN NACL 2-0.9 UNIT/ML-% IJ SOLN
INTRAMUSCULAR | Status: AC
  Filled 2016-02-28: qty 1000

## 2016-02-28 MED ORDER — MIDAZOLAM HCL 2 MG/2ML IJ SOLN
INTRAMUSCULAR | Status: DC | PRN
  Administered 2016-02-28: 1 mg via INTRAVENOUS

## 2016-02-28 MED ORDER — SODIUM CHLORIDE 0.9 % IV SOLN
INTRAVENOUS | Status: AC
  Administered 2016-02-28: 12:00:00 via INTRAVENOUS

## 2016-02-28 MED ORDER — HEPARIN SODIUM (PORCINE) 1000 UNIT/ML IJ SOLN
INTRAMUSCULAR | Status: DC | PRN
  Administered 2016-02-28: 4000 [IU] via INTRAVENOUS
  Administered 2016-02-28: 2000 [IU] via INTRAVENOUS
  Administered 2016-02-28: 3000 [IU] via INTRAVENOUS

## 2016-02-28 MED ORDER — NITROGLYCERIN 1 MG/10 ML FOR IR/CATH LAB
INTRA_ARTERIAL | Status: DC | PRN
  Administered 2016-02-28: 200 ug via INTRACORONARY

## 2016-02-28 MED ORDER — IOPAMIDOL (ISOVUE-370) INJECTION 76%
INTRAVENOUS | Status: AC
  Filled 2016-02-28: qty 100

## 2016-02-28 MED ORDER — FENTANYL CITRATE (PF) 100 MCG/2ML IJ SOLN
INTRAMUSCULAR | Status: AC
  Filled 2016-02-28: qty 2

## 2016-02-28 MED ORDER — FENTANYL CITRATE (PF) 100 MCG/2ML IJ SOLN
INTRAMUSCULAR | Status: DC | PRN
  Administered 2016-02-28: 50 ug via INTRAVENOUS

## 2016-02-28 MED ORDER — VERAPAMIL HCL 2.5 MG/ML IV SOLN
INTRAVENOUS | Status: AC
  Filled 2016-02-28: qty 2

## 2016-02-28 MED ORDER — LIDOCAINE HCL (PF) 1 % IJ SOLN
INTRAMUSCULAR | Status: AC
  Filled 2016-02-28: qty 30

## 2016-02-28 MED ORDER — CLOPIDOGREL BISULFATE 75 MG PO TABS
75.0000 mg | ORAL_TABLET | Freq: Every day | ORAL | Status: DC
  Administered 2016-02-29: 75 mg via ORAL
  Filled 2016-02-28: qty 1

## 2016-02-28 MED ORDER — MIDAZOLAM HCL 2 MG/2ML IJ SOLN
INTRAMUSCULAR | Status: AC
  Filled 2016-02-28: qty 2

## 2016-02-28 MED ORDER — NITROGLYCERIN 1 MG/10 ML FOR IR/CATH LAB
INTRA_ARTERIAL | Status: AC
  Filled 2016-02-28: qty 10

## 2016-02-28 MED ORDER — HEPARIN (PORCINE) IN NACL 2-0.9 UNIT/ML-% IJ SOLN
INTRAMUSCULAR | Status: DC | PRN
  Administered 2016-02-28: 10:00:00

## 2016-02-28 MED ORDER — ANGIOPLASTY BOOK
Freq: Once | Status: AC
  Administered 2016-02-28: 1
  Filled 2016-02-28: qty 1

## 2016-02-28 MED ORDER — LIDOCAINE HCL (PF) 1 % IJ SOLN
INTRAMUSCULAR | Status: DC | PRN
  Administered 2016-02-28: 2 mL

## 2016-02-28 SURGICAL SUPPLY — 18 items
BALLN EMERGE MR 2.5X12 (BALLOONS) ×2
BALLN ~~LOC~~ EMERGE MR 2.75X15 (BALLOONS) ×2
BALLOON EMERGE MR 2.5X12 (BALLOONS) IMPLANT
BALLOON ~~LOC~~ EMERGE MR 2.75X15 (BALLOONS) IMPLANT
CATH INFINITI 5FR ANG PIGTAIL (CATHETERS) ×1 IMPLANT
CATH OPTITORQUE JACKY 4.0 5F (CATHETERS) ×1 IMPLANT
CATH VISTA GUIDE 6FR XB3 (CATHETERS) ×1 IMPLANT
DEVICE RAD COMP TR BAND LRG (VASCULAR PRODUCTS) ×1 IMPLANT
GLIDESHEATH SLEND SS 6F .021 (SHEATH) ×1 IMPLANT
KIT ENCORE 26 ADVANTAGE (KITS) ×1 IMPLANT
KIT HEART LEFT (KITS) ×2 IMPLANT
PACK CARDIAC CATHETERIZATION (CUSTOM PROCEDURE TRAY) ×2 IMPLANT
STENT PROMUS PREM MR 2.5X20 (Permanent Stent) ×1 IMPLANT
SYR MEDRAD MARK V 150ML (SYRINGE) ×2 IMPLANT
TRANSDUCER W/STOPCOCK (MISCELLANEOUS) ×2 IMPLANT
TUBING CIL FLEX 10 FLL-RA (TUBING) ×2 IMPLANT
WIRE RUNTHROUGH .014X180CM (WIRE) ×1 IMPLANT
WIRE SAFE-T 1.5MM-J .035X260CM (WIRE) ×1 IMPLANT

## 2016-02-28 NOTE — Interval H&P Note (Signed)
Cath Lab Visit (complete for each Cath Lab visit)  Clinical Evaluation Leading to the Procedure:   ACS: No.  Non-ACS:    Anginal Classification: CCS II  Anti-ischemic medical therapy: Minimal Therapy (1 class of medications)  Non-Invasive Test Results: Intermediate-risk stress test findings: cardiac mortality 1-3%/year  Prior CABG: No previous CABG      History and Physical Interval Note:  02/28/2016 9:47 AM  Travis Jacobs  has presented today for surgery, with the diagnosis of abnormal stress test  The various methods of treatment have been discussed with the patient and family. After consideration of risks, benefits and other options for treatment, the patient has consented to  Procedure(s): Left Heart Cath and Coronary Angiography (N/A) as a surgical intervention .  The patient's history has been reviewed, patient examined, no change in status, stable for surgery.  I have reviewed the patient's chart and labs.  Questions were answered to the patient's satisfaction.     Lorine BearsMuhammad Cortney Beissel

## 2016-02-28 NOTE — Progress Notes (Signed)
Pt also has right hand piv , noticed upon assessment , for second IV for procedure . Marisue Ivanobyn Buster Schueller RN

## 2016-02-28 NOTE — Progress Notes (Signed)
Patient Profile: 65 y/o male, admitted for elective LHC in the setting of an abnormal stress test at PCP office demonstrating diffuse ST depressions. Also with recent anginal symptomatology.   Subjective: No issues overnight. He denies CP. No dyspnea. No questions regarding his cath.   Objective: Vital signs in last 24 hours: Temp:  [97 F (36.1 C)-98 F (36.7 C)] 97.7 F (36.5 C) (05/17 0445) Pulse Rate:  [61-91] 65 (05/17 0445) Resp:  [16-22] 16 (05/17 0445) BP: (129-175)/(71-93) 129/71 mmHg (05/17 0445) SpO2:  [95 %-100 %] 95 % (05/17 0445) Weight:  [181 lb 3.5 oz (82.2 kg)-181 lb 14.1 oz (82.5 kg)] 181 lb 14.1 oz (82.5 kg) (05/17 0445) Last BM Date: 02/27/16  Intake/Output from previous day: 05/16 0701 - 05/17 0700 In: 1062.3 [P.O.:480; I.V.:582.3] Out: 975 [Urine:975] Intake/Output this shift: Total I/O In: 822.3 [P.O.:240; I.V.:582.3] Out: 375 [Urine:375]  Medications Current Facility-Administered Medications  Medication Dose Route Frequency Provider Last Rate Last Dose  . 0.9 %  sodium chloride infusion  250 mL Intravenous PRN Dwana MelenaBryan W Hager, PA-C      . 0.9% sodium chloride infusion  1 mL/kg/hr Intravenous Continuous Dwana MelenaBryan W Hager, PA-C 82.2 mL/hr at 02/28/16 0400 1 mL/kg/hr at 02/28/16 0400  . acetaminophen (TYLENOL) tablet 650 mg  650 mg Oral Q4H PRN Dwana MelenaBryan W Hager, PA-C      . [START ON 02/29/2016] aspirin EC tablet 81 mg  81 mg Oral Daily Kathleene Hazelhristopher D McAlhany, MD      . atorvastatin (LIPITOR) tablet 40 mg  40 mg Oral q1800 Dwana MelenaBryan W Hager, PA-C   40 mg at 02/27/16 1733  . diphenhydrAMINE (BENADRYL) injection 25 mg  25 mg Intravenous Pre-Cath Dwana MelenaBryan W Hager, PA-C      . famotidine (PEPCID) IVPB 20 mg premix  20 mg Intravenous Pre-Cath Dwana MelenaBryan W Hager, PA-C      . heparin injection 5,000 Units  5,000 Units Subcutaneous Q8H Dwana MelenaBryan W Hager, PA-C   5,000 Units at 02/28/16 0602  . metoprolol tartrate (LOPRESSOR) tablet 25 mg  25 mg Oral BID Dwana MelenaBryan W Hager, PA-C   25 mg at  02/27/16 2238  . nitroGLYCERIN (NITROSTAT) SL tablet 0.4 mg  0.4 mg Sublingual Q5 Min x 3 PRN Dwana MelenaBryan W Hager, PA-C      . ondansetron [facility](ZOFRAN) injection 4 mg  4 mg Intravenous Q6H PRN Dwana MelenaBryan W Hager, PA-C      . predniSONE (DELTASONE) tablet 60 mg  60 mg Oral Pre-Cath Dwana MelenaBryan W Hager, PA-C      . sodium chloride flush (NS) 0.9 % injection 3 mL  3 mL Intravenous Q12H Kelle DartingBryan W Hager, PA-C      . sodium chloride flush (NS) 0.9 % injection 3 mL  3 mL Intravenous PRN Dwana MelenaBryan W Hager, PA-C   3 mL at 02/27/16 1738    PE: General appearance: alert, cooperative and no distress Neck: no carotid bruit and no JVD Lungs: clear to auscultation bilaterally Heart: regular rate and rhythm, S1, S2 normal, no murmur, click, rub or gallop Extremities: no LEE Pulses: 2+ and symmetric Skin: warm and dry Neurologic: Grossly normal  Lab Results:   Recent Labs  02/27/16 1632  WBC 12.2*  HGB 14.9  HCT 44.2  PLT 288   BMET  Recent Labs  02/27/16 1632 02/28/16 0445  NA 139 140  K 3.9 4.4  CL 106 108  CO2 23 22  GLUCOSE 121* 137*  BUN 10 11  CREATININE 0.90 0.79  CALCIUM  10.0 9.4   PT/INR  Recent Labs  02/27/16 1632  LABPROT 14.5  INR 1.11   Cholesterol  Recent Labs  02/28/16 0445  CHOL 177   Cardiac Panel (last 3 results)  Recent Labs  02/27/16 1632 02/28/16 0445  TROPONINI <0.03 <0.03    Studies/Results: LHC - pending   Assessment/Plan  Principal Problem:   Abnormal stress test Active Problems:   Ventricular bigeminy   1. Abnormal Stress Test: diffuse ST depressions noted during stress test at 6 min mark at PCP office.Currently CP free. No events overnight. Plan for elective LHC today +/- PCI. Home later today normal findings.   2. HLD: LDL is 110 mg/dL. Home Lipitor was increased at time of admit from 10 mg to 40 mg nightly. Recheck FLP and HFTs in 6 weeks. If CAD on cath, target LDL will be <70 mg/dL.   3. Contrast Allergy: prednisone, Pepcid and Benadryl ordered.  Will monitor closely post cath.    LOS: 1 day    Brittainy M. Delmer Islam 02/28/2016 6:33 AM  I have personally seen and examined this patient with Robbie Lis, PA-C. I agree with the assessment and plan as outlined above. Plans for cardiac cath today given dyspnea, chest pain and recent abnormal exercise stress test. He is aware of the risks and benefits of the procedure and agrees to proceed.   Verne Carrow 02/28/2016 8:43 AM

## 2016-02-29 DIAGNOSIS — I2510 Atherosclerotic heart disease of native coronary artery without angina pectoris: Secondary | ICD-10-CM

## 2016-02-29 DIAGNOSIS — I25110 Atherosclerotic heart disease of native coronary artery with unstable angina pectoris: Secondary | ICD-10-CM

## 2016-02-29 LAB — CBC
HCT: 43.7 % (ref 39.0–52.0)
Hemoglobin: 14.7 g/dL (ref 13.0–17.0)
MCH: 29.2 pg (ref 26.0–34.0)
MCHC: 33.6 g/dL (ref 30.0–36.0)
MCV: 86.7 fL (ref 78.0–100.0)
PLATELETS: 271 10*3/uL (ref 150–400)
RBC: 5.04 MIL/uL (ref 4.22–5.81)
RDW: 13.4 % (ref 11.5–15.5)
WBC: 16 10*3/uL — AB (ref 4.0–10.5)

## 2016-02-29 LAB — BASIC METABOLIC PANEL
Anion gap: 9 (ref 5–15)
BUN: 13 mg/dL (ref 6–20)
CALCIUM: 9.2 mg/dL (ref 8.9–10.3)
CO2: 24 mmol/L (ref 22–32)
CREATININE: 0.88 mg/dL (ref 0.61–1.24)
Chloride: 106 mmol/L (ref 101–111)
GFR calc non Af Amer: >60 mL/min (ref >60)
Glucose, Bld: 116 mg/dL — ABNORMAL HIGH (ref 65–99)
Potassium: 4 mmol/L (ref 3.5–5.1)
SODIUM: 139 mmol/L (ref 135–145)

## 2016-02-29 MED ORDER — ATORVASTATIN CALCIUM 40 MG PO TABS: 40.0000 mg | ORAL_TABLET | Freq: Every day | ORAL | Status: DC

## 2016-02-29 MED ORDER — METOPROLOL TARTRATE 25 MG PO TABS: 25.0000 mg | ORAL_TABLET | Freq: Two times a day (BID) | ORAL | Status: DC

## 2016-02-29 MED ORDER — CLOPIDOGREL BISULFATE 75 MG PO TABS: 75.0000 mg | ORAL_TABLET | Freq: Every day | ORAL | Status: DC

## 2016-02-29 MED ORDER — PANTOPRAZOLE SODIUM 40 MG PO TBEC: 40.0000 mg | DELAYED_RELEASE_TABLET | Freq: Every day | ORAL | Status: DC

## 2016-02-29 MED ORDER — ASPIRIN 81 MG PO TBEC: 81.0000 mg | DELAYED_RELEASE_TABLET | Freq: Every day | ORAL | Status: DC

## 2016-02-29 MED ORDER — NITROGLYCERIN 0.4 MG SL SUBL: 0.4000 mg | SUBLINGUAL_TABLET | SUBLINGUAL | Status: DC | PRN

## 2016-02-29 NOTE — Discharge Summary (Signed)
Discharge Summary    Patient ID: Travis Jacobs,  MRN: [id number], DOB/AGE: 01-04-1951 65 y.o.  Admit date: 02/27/2016 Discharge date: 02/29/2016  Primary Care Provider: Enrique Sack Primary Cardiologist: Dr. Clifton James  Discharge Diagnoses    Principal Problem:   Abnormal stress test Active Problems:   Ventricular bigeminy   Effort angina [facility])   CAD- s/p PCI +DES to OM1 02/28/16   Allergies Allergies  Allergen Reactions  . Aleve [Naproxen Sodium]   . Apple   . Banana   . Beef-Derived Products   . Erythromycin Swelling    Mouth swells  . Fish Allergy   . Garlic   . Sesame Seed (Diagnostic)   . Shellfish Allergy Other (See Comments)    Not sure of reaction found out about allergy through [place]  . Sulfonamide Derivatives Swelling    Mouth swelling  . Tomato   . Iodine Swelling    "I blow up like a balloon"    Diagnostic Studies/Procedures    LHC 02/28/16 Procedures    Coronary Stent Intervention   Left Heart Cath and Coronary Angiography    Conclusion     Prox RCA lesion, 30% stenosed.  Mid RCA to Dist RCA lesion, 40% stenosed.  Prox LAD to Mid LAD lesion, 30% stenosed.  1st Mrg lesion, 99% stenosed. Post intervention, there is a 0% residual stenosis.  The left ventricular systolic function is normal.  1. Severe (99% ) one-vessel coronary artery disease involving proximal OM1 which is very large and supplies most of the left circumflex distribution. 2. Normal LV systolic function and mildly elevated left ventricular end-diastolic pressure. 3. Successful angioplasty and drug-eluting stent placement to OM 1.   Left Heart    Left Ventricle The left ventricular size is normal. The left ventricular systolic function is normal. The left ventricular ejection fraction is 55-65% by visual estimate. There are no wall motion abnormalities in the left ventricle.   Mitral Valve There is no mitral valve regurgitation.   Aortic  Valve There is no aortic valve stenosis.          History of Present Illness     65 y/o male, admitted for elective LHC in the setting of an abnormal stress test at PCP office demonstrating diffuse ST depressions. Also with recent anginal symptomatology.   Hospital Course    Patient was admitted to [facility] 02/27/16. Cardiac enzymes were cycled and negative, ruling him out for MI.  On 02/28/16, he underwent a LHC, performed by Dr. Kirke Corin. Access was obtained via the right radial artery. He was found to have severe (99% ) one-vessel coronary artery disease involving proximal OM1 which is very large and supplies most of the left circumflex distribution. Normal LV systolic function and mildly elevated left ventricular end-diastolic pressure. He underwent successful angioplasty and drug-eluting stent placement to OM 1. He tolerated the procedure well and had no recurrent CP. No dyspnea. He was able to ambulate with cardiac rehab w/o difficulty. Cath site, renal function and vital signs remained stable. He was placed on  DAPT with ASA + Plavix, high intensity statin and BB. LDL was 110 mg/dL. His home Lipitor was increased at time of admit from 10 mg to 40 mg nightly. He will need a f/u FLP and HFTs in 6 weeks. Given CAD, target LDL will be <70 mg/dL.   Patient was noted to have ventricular bigeminy during cath. Telemetry showed frequent PVCs overnight. Day of discharge, he was in NSR with  HR in the upper 60s. He was continued on metoprolol, 25 mg BID. Can consider a 24-48 hr holter monitor as an outpatient to assess ectopic burden, if with recurrence.   Patient was last seen and examined by Dr. Clifton James, who determined he was stable for discharge home. Post hospital f/u has been arranged with Ronie Spies, PA-C on 03/12/16.    Consultants: none   Discharge Vitals Blood pressure 137/72, pulse 66, temperature 97.7 F (36.5 C), temperature source Oral, resp. rate 14, weight 182 lb 15.7 oz (83 kg), SpO2 100  %.  Filed Weights   02/27/16 1634 02/28/16 0445 02/29/16 0423  Weight: 181 lb 3.5 oz (82.2 kg) 181 lb 14.1 oz (82.5 kg) 182 lb 15.7 oz (83 kg)    Labs & Radiologic Studies    CBC  Recent Labs  02/27/16 1632 02/29/16 0412  WBC 12.2* 16.0*  NEUTROABS 9.7*  --   HGB 14.9 14.7  HCT 44.2 43.7  MCV 86.5 86.7  PLT 288 271   Basic Metabolic Panel  Recent Labs  02/28/16 0445 02/29/16 0412  NA 140 139  K 4.4 4.0  CL 108 106  CO2 22 24  GLUCOSE 137* 116*  BUN 11 13  CREATININE 0.79 0.88  CALCIUM 9.4 9.2   Liver Function Tests  Recent Labs  02/27/16 1632  AST 24  ALT 27  ALKPHOS 75  BILITOT 0.7  PROT 6.7  ALBUMIN 3.7   No results for input(s): LIPASE, AMYLASE in the last 72 hours. Cardiac Enzymes  Recent Labs  02/27/16 1632 02/28/16 0445  TROPONINI <0.03 <0.03   BNP Invalid input(s): POCBNP D-Dimer No results for input(s): DDIMER in the last 72 hours. Hemoglobin A1C  Recent Labs  02/27/16 1632  HGBA1C 6.1*   Fasting Lipid Panel  Recent Labs  02/28/16 0445  CHOL 177  HDL 39*  LDLCALC 110*  TRIG 138  CHOLHDL 4.5   Thyroid Function Tests  Recent Labs  02/27/16 1632  TSH 1.368   _____________  X-ray Chest Pa And Lateral  02/27/2016  CLINICAL DATA:  Abnormal stress test today, scheduled for heart catheterization tomorrow, history hiatal hernia, hyperlipidemia, former smoker, GERD EXAM: CHEST  2 VIEW COMPARISON:  None FINDINGS: Normal heart size, mediastinal contours, and pulmonary vascularity. Atherosclerotic calcification aorta. Emphysematous changes consistent with COPD. No acute infiltrate, pleural effusion, or pneumothorax. Bones unremarkable. IMPRESSION: COPD changes without acute abnormalities. Electronically Signed   By: Ulyses Southward M.D.   On: 02/27/2016 22:46   Disposition   Pt is being discharged home today in good condition.  Follow-up Plans & Appointments    Follow-up Information    Follow up with Laurann Montana, PA-C On  03/12/2016.   Specialties:  Cardiology, Radiology   Why:  10:30 AM (Cardiology PA)   Contact information:   [address] [place] [address] [phone]      Discharge Instructions    AMB Referral to Cardiac Rehabilitation - Phase II    Complete by:  As directed   Diagnosis:   PTCA Stable Angina       Amb Referral to Cardiac Rehabilitation    Complete by:  As directed   Diagnosis:  Coronary Stents     Diet - low sodium heart healthy    Complete by:  As directed      Increase activity slowly    Complete by:  As directed            Discharge Medications  Current Discharge Medication List    START taking these medications   Details  aspirin EC 81 MG EC tablet Take 1 tablet (81 mg total) by mouth daily.    clopidogrel (PLAVIX) 75 MG tablet Take 1 tablet (75 mg total) by mouth daily with breakfast. Qty: 30 tablet, Refills: 11    metoprolol tartrate (LOPRESSOR) 25 MG tablet Take 1 tablet (25 mg total) by mouth 2 (two) times daily. Qty: 60 tablet, Refills: 5    nitroGLYCERIN (NITROSTAT) 0.4 MG SL tablet Place 1 tablet (0.4 mg total) under the tongue every 5 (five) minutes x 3 doses as needed for chest pain. Qty: 25 tablet, Refills: 2      CONTINUE these medications which have CHANGED   Details  atorvastatin (LIPITOR) 40 MG tablet Take 1 tablet (40 mg total) by mouth daily. Qty: 30 tablet, Refills: 5      CONTINUE these medications which have NOT CHANGED   Details  docusate sodium (COLACE) 100 MG capsule Take 500 mg by mouth daily.    halobetasol (ULTRAVATE) 0.05 % cream Apply topically 2 (two) times daily.    omeprazole (PRILOSEC) 10 MG capsule Take 20 mg by mouth daily.     Probiotic Product (PROBIOTIC DAILY PO) Take by mouth.    triamcinolone (NASACORT ALLERGY 24HR) 55 MCG/ACT AERO nasal inhaler Place 2 sprays into the nose daily.    CIALIS 5 MG tablet Take 5 mg by mouth daily as needed for erectile dysfunction.  Refills: 98           Aspirin prescribed at discharge?  Yes High Intensity Statin Prescribed? (Lipitor 40-80mg  or Crestor 20-40mg ): Yes Beta Blocker Prescribed? Yes For EF <40%, was ACEI/ARB Prescribed? No:  ADP Receptor Inhibitor Prescribed? (i.e. Plavix etc.-Includes Medically Managed Patients): Yes For EF <40%, Aldosterone Inhibitor Prescribed? No: EF >40% Was EF assessed during THIS hospitalization? Yes Was Cardiac Rehab II ordered? (Included Medically managed Patients): No:    Outstanding Labs/Studies   Recheck FLP and HFTs in 6 weeks.   Duration of Discharge Encounter   Greater than 30 minutes including physician time.  Signed, Robbie LisBrittainy Sruti Ayllon PA-C 02/29/2016, 9:04 AM

## 2016-02-29 NOTE — Progress Notes (Signed)
Patient Profile: 65 y/o male, admitted for elective LHC in the setting of an abnormal stress test at PCP office demonstrating diffuse ST depressions. Also with recent anginal symptomatology. Now s/p PCI to OM 1.  Subjective: Stable. No complaints. He denies any recurrent CP. No dyspnea. Ambulating w/o difficulty.   Objective: Vital signs in last 24 hours: Temp:  [97.6 F (36.4 C)-97.7 F (36.5 C)] 97.7 F (36.5 C) (05/18 0423) Pulse Rate:  [0-85] 58 (05/18 0423) Resp:  [0-23] 21 (05/18 0423) BP: (103-150)/(47-86) 138/84 mmHg (05/18 0423) SpO2:  [0 %-100 %] 100 % (05/18 0423) Weight:  [182 lb 15.7 oz (83 kg)] 182 lb 15.7 oz (83 kg) (05/18 0423) Last BM Date: 02/27/16  Intake/Output from previous day: 05/17 0701 - 05/18 0700 In: 582.4 [P.O.:265; I.V.:267.4; IV Piggyback:50] Out: 1400 [Urine:1400] Intake/Output this shift:    Medications Current Facility-Administered Medications  Medication Dose Route Frequency Provider Last Rate Last Dose  . 0.9 %  sodium chloride infusion  250 mL Intravenous PRN Iran Ouch, MD      . acetaminophen (TYLENOL) tablet 650 mg  650 mg Oral Q4H PRN Dwana Melena, PA-C      . aspirin EC tablet 81 mg  81 mg Oral Daily Kathleene Hazel, MD      . atorvastatin (LIPITOR) tablet 40 mg  40 mg Oral q1800 Dwana Melena, PA-C   40 mg at 02/28/16 1835  . clopidogrel (PLAVIX) tablet 75 mg  75 mg Oral Q breakfast Iran Ouch, MD      . heparin injection 5,000 Units  5,000 Units Subcutaneous Q8H Dwana Melena, PA-C   5,000 Units at 02/29/16 0631  . metoprolol tartrate (LOPRESSOR) tablet 25 mg  25 mg Oral BID Dwana Melena, PA-C   25 mg at 02/28/16 2220  . nitroGLYCERIN (NITROSTAT) SL tablet 0.4 mg  0.4 mg Sublingual Q5 Min x 3 PRN Dwana Melena, PA-C      . ondansetron [facility]) injection 4 mg  4 mg Intravenous Q6H PRN Dwana Melena, PA-C      . sodium chloride flush (NS) 0.9 % injection 3 mL  3 mL Intravenous Q12H Iran Ouch, MD   3 mL  at 02/28/16 2201  . sodium chloride flush (NS) 0.9 % injection 3 mL  3 mL Intravenous PRN Iran Ouch, MD        PE: General appearance: alert, cooperative and no distress Neck: no carotid bruit and no JVD Lungs: clear to auscultation bilaterally Heart: regular rate and rhythm, S1, S2 normal, no murmur, click, rub or gallop Extremities: no LEE Pulses: 2+ and symmetric Skin: warm and dry Neurologic: Grossly normal  Lab Results:   Recent Labs  02/27/16 1632 02/29/16 0412  WBC 12.2* 16.0*  HGB 14.9 14.7  HCT 44.2 43.7  PLT 288 271   BMET  Recent Labs  02/27/16 1632 02/28/16 0445 02/29/16 0412  NA 139 140 139  K 3.9 4.4 4.0  CL 106 108 106  CO2 GLUCOSE 121* 137* 116*  BUN CREATININE 0.90 0.79 0.88  CALCIUM 10.0 9.4 9.2   PT/INR  Recent Labs  02/27/16 1632  LABPROT 14.5  INR 1.11   Cholesterol  Recent Labs  02/28/16 0445  CHOL 177   Cardiac Panel (last 3 results)  Recent Labs  02/27/16 1632 02/28/16 0445  TROPONINI <0.03 <0.03    Studies/Results:  LHC 02/28/16 Procedures  Coronary Stent Intervention   Left Heart Cath and Coronary Angiography    Conclusion     Prox RCA lesion, 30% stenosed.  Mid RCA to Dist RCA lesion, 40% stenosed.  Prox LAD to Mid LAD lesion, 30% stenosed.  1st Mrg lesion, 99% stenosed. Post intervention, there is a 0% residual stenosis.  The left ventricular systolic function is normal.  1. Severe (99% ) one-vessel coronary artery disease involving proximal OM1 which is very large and supplies most of the left circumflex distribution. 2. Normal LV systolic function and mildly elevated left ventricular end-diastolic pressure. 3. Successful angioplasty and drug-eluting stent placement to OM 1.   Left Heart    Left Ventricle The left ventricular size is normal. The left ventricular systolic function is normal. The left ventricular ejection fraction is 55-65% by visual estimate. There  are no wall motion abnormalities in the left ventricle.   Mitral Valve There is no mitral valve regurgitation.   Aortic Valve There is no aortic valve stenosis.     Assessment/Plan  Principal Problem:   Abnormal stress test Active Problems:   Ventricular bigeminy   Effort angina (HCC)   1. Abnormal Stress Test: diffuse ST depressions noted during stress test at 6 min mark at PCP office. LHC 02/28/16 showed severe (99% ) one-vessel coronary artery disease involving proximal OM1 which is very large and supplies most of the left circumflex distribution. Normal LV systolic function and mildly elevated left ventricular end-diastolic pressure. S/p successful angioplasty and drug-eluting stent placement to OM 1. No recurrent CP. No dyspnea. Ambulating w/o difficulty. Cath site, renal function and VSS. Continue DAPT with ASA + Plavix, high intensity statin and BB.    2. HLD: LDL is 110 mg/dL. Home Lipitor was increased at time of admit from 10 mg to 40 mg nightly. Recheck FLP and HFTs in 6 weeks. Given CAD, target LDL will be <70 mg/dL.   3. Contrast Allergy: treated precath with prednisone, Pepcid and Benadryl ordered. No adverse reactions.   4. Post Cath: renal function is stable. Cath site stable.   5. PVCs: Patient was noted to have ventricular bigeminy during cath. Telemetry shows frequent PVCs overnight. Currently in NSR with HR in the upper 60s. Continue metoprolol, 25 mg BID. Consider 24-48 hr holter monitor as an outpatient to assess ectopic burden.   Dispo: Discharge home today. F/u in 1-2 weeks.     LOS: 2 days    Brittainy M. Delmer IslamSimmons, PA-C 02/29/2016 7:52 AM  I have personally seen and examined this patient with Robbie LisBrittainy Simmons, PA-C. I agree with the assessment and plan as outlined above. He is admitted with unstable angina. Cardiac cath 02/28/16 with severe stenosis OM. Now s/p DES x 1 OM. Mild plaque LAD and RCA. LV function is normal. Will continue DAPT with ASA and  Plavix for one year. Continue statin and beta blocker. D/C home today. Follow up with me or office APP in 1-2 weeks.   Verne CarrowChristopher Sundance Moise 02/29/2016 8:38 AM

## 2016-02-29 NOTE — Care Management Note (Signed)
Case Management Note  Patient Details  Name: Travis Jacobs MRN: [id number] Date of Birth: [birth year]  Subjective/Objective:        Patient is from home with spouse, s/p pci intervention, on plavix.  PTA indep.  No needs.            Action/Plan:   Expected Discharge Date:                  Expected Discharge Plan:  Home/Self Care  In-House Referral:     Discharge planning Services  CM Consult  Post Acute Care Choice:    Choice offered to:     DME Arranged:    DME Agency:     HH Arranged:    HH Agency:     Status of Service:  Completed, signed off  Medicare Important Message Given:    Date Medicare IM Given:    Medicare IM give by:    Date Additional Medicare IM Given:    Additional Medicare Important Message give by:     If discussed at Long Length of Stay Meetings, dates discussed:    Additional Comments:  Leone Havenaylor, Andelyn Spade Clinton, RN 02/29/2016, 9:13 AM

## 2016-02-29 NOTE — Progress Notes (Signed)
CARDIAC REHAB PHASE I   PRE:  Rate/Rhythm: 65 SR  BP:  Supine: 137/72  Sitting:   Standing:    SaO2:   MODE:  Ambulation: 800 ft   POST:  Rate/Rhythm: 86 SR  BP:  Supine:   Sitting: 144/77  Standing:    SaO2:  0740-0825 Pt walked 800 ft with fast pace. Denied CP. Was a little lightheaded at beginning but cleared with walk. Education completed with pt who voiced understanding. Discussed CRP 2 and will refer to [place]. Encouraged walking as ex, following heart healthy diet, and reviewed NTG use and the importance of plavix with stent.   Luetta Nuttingharlene Zakyia Gagan, RN BSN  02/29/2016 8:22 AM

## 2016-03-12 ENCOUNTER — Encounter: Payer: Self-pay | Admitting: Physician Assistant

## 2016-03-12 ENCOUNTER — Encounter: Payer: Medicare Other | Admitting: Physician Assistant

## 2016-03-12 DIAGNOSIS — I493 Ventricular premature depolarization: Secondary | ICD-10-CM | POA: Insufficient documentation

## 2016-03-12 DIAGNOSIS — E785 Hyperlipidemia, unspecified: Secondary | ICD-10-CM | POA: Insufficient documentation

## 2016-03-12 DIAGNOSIS — R7303 Prediabetes: Secondary | ICD-10-CM | POA: Insufficient documentation

## 2016-03-12 NOTE — Progress Notes (Signed)
Cardiology Office Note    Date:  03/13/2016  ID:  Travis Jacobs, DOB 05/03/1951, MRN [id number] PCP:  Enrique Sack, MD  Cardiologist:  Clifton James   Chief Complaint: f/u stenting  History of Present Illness:  Travis Jacobs is a 65 y.o. male with history of recently diagnosed CAD (DES to OM1 02/2016), PVCs, HLD, GERD, pre-DM (A1C 6.1), esophageal stricture who presents for post-hospital follow-up. Earlier this month he underwent elective LHC due to abnormal stress test. This was done due to abnormal EKG after his PCP heard an irregularity in his heartbeat. He did not really have typical anginal sx before this - occasional dyspnea and a warm sensation in his chest but nothing very reproducible. LHC 02/28/16: 99% OM1 s/p DES and was started on DAPT (residual 30% pRCA, 4% m-dRCA, 30% p-mLAD, normal LV function). LDL 110 thus Lipitor was increased. He was noted to have ventricular bigeminy/PVCs post-cath thus was continued on metoprolol. Labs revealed WBC 12->16, glucose 116->137.  He comes in today for follow-up doing well. No CP or SOB. He notices he no longer has to sleep propped up at night. He finds himself comfortably lying on his side instead. He also notices the varicose veins in his ankles aren't as pronounced ever since his PCI. No LEE, syncope, or bleeding. He is unaware of any palpitations.   Past Medical History  Diagnosis Date  . GERD (gastroesophageal reflux disease)   . Esophageal stricture   . Hemorrhoids   . Hiatal hernia   . Diverticulosis   . IBS (irritable bowel syndrome)   . Hyperlipidemia   . Cataract     bil cateracts  . Multiple food allergies     "lots of food allergies; beef, tuna, flounder, potatoes, garlic, tomatoes, apples, bananas, sesame seeds, shellfish  . CAD (coronary artery disease)     a. DES to OM1 02/2016.   Marland Kitchen PVC's (premature ventricular contractions)   . Pre-diabetes     Past Surgical History  Procedure Laterality Date  . Surgery scrotal  / testicular Left ~ 1960    "hernia"  . Colonoscopy    . Upper gastrointestinal endoscopy    . Tonsillectomy    . Esophagogastroduodenoscopy (egd) with esophageal dilation    . Cystoscopy with stent placement    . Hernia repair    . Hemorrhoid banding    . Cardiac catheterization N/A 02/28/2016    Procedure: Left Heart Cath and Coronary Angiography;  Surgeon: Iran Ouch, MD;  Location: [facility];  Service: Cardiovascular;  Laterality: N/A;  . Cardiac catheterization N/A 02/28/2016    Procedure: Coronary Stent Intervention;  Surgeon: Iran Ouch, MD;  Location: [facility] CV [facility];  Service: Cardiovascular;  Laterality: N/A;    Current Medications: Outpatient Prescriptions Prior to Visit  Medication Sig Dispense Refill  . aspirin EC 81 MG EC tablet Take 1 tablet (81 mg total) by mouth daily.    Marland Kitchen atorvastatin (LIPITOR) 40 MG tablet Take 1 tablet (40 mg total) by mouth daily. 30 tablet 5  . CIALIS 5 MG tablet Take 5 mg by mouth daily as needed for erectile dysfunction.   98  . clopidogrel (PLAVIX) 75 MG tablet Take 1 tablet (75 mg total) by mouth daily with breakfast. 30 tablet 11  . docusate sodium (COLACE) 100 MG capsule Take 500 mg by mouth daily.    . halobetasol (ULTRAVATE) 0.05 % cream Apply topically 2 (two) times daily.    . metoprolol tartrate (  LOPRESSOR) 25 MG tablet Take 1 tablet (25 mg total) by mouth 2 (two) times daily. 60 tablet 5  . nitroGLYCERIN (NITROSTAT) 0.4 MG SL tablet Place 1 tablet (0.4 mg total) under the tongue every 5 (five) minutes x 3 doses as needed for chest pain. 25 tablet 2  . pantoprazole (PROTONIX) 40 MG tablet Take 1 tablet (40 mg total) by mouth daily. 30 tablet 5  . Probiotic Product (PROBIOTIC DAILY PO) Take 1 capsule by mouth daily.     Marland Kitchen triamcinolone (NASACORT ALLERGY 24HR) 55 MCG/ACT AERO nasal inhaler Place 2 sprays into the nose daily.     No facility-administered medications prior to visit.     Allergies:   Aleve; Apple;  Banana; Beef-derived products; Erythromycin; Fish allergy; Garlic; Sesame seed (diagnostic); Shellfish allergy; Sulfonamide derivatives; Tomato; and Iodine   Social History   Social History  . Marital Status: Married    Spouse Name: N/A  . Number of Children: N/A  . Years of Education: N/A   Social History Main Topics  . Smoking status: Former Smoker -- 1.00 packs/day for 20 years    Types: Cigarettes  . Smokeless tobacco: Never Used     Comment: "quit smoking cigarettes in the 1980s"  . Alcohol Use: 0.0 oz/week    0 Standard drinks or equivalent per week     Comment: 02/27/2016 "nothing in the last few weeks; no regular pattern"  . Drug Use: No  . Sexual Activity: Yes   Other Topics Concern  . None   Social History Narrative     Family History:  The patient's family history includes Heart disease in his father and mother. There is no history of Colon cancer, Esophageal cancer, Rectal cancer, or Stomach cancer.   ROS:   Please see the history of present illness.  All other systems are reviewed and otherwise negative.    PHYSICAL EXAM:   VS:  BP 120/82 mmHg  Pulse 54  Ht 5\' 10"  (1.778 m)  Wt 180 lb 12.8 oz (82.01 kg)  BMI 25.94 kg/m2  BMI: Body mass index is 25.94 kg/(m^2). GEN: Well nourished, well developed WM, in no acute distress HEENT: normocephalic, atraumatic Neck: no JVD, carotid bruits, or masses Cardiac: RRR; no murmurs, rubs, or gallops, no edema  Respiratory:  clear to auscultation bilaterally, normal work of breathing GI: soft, nontender, nondistended, + BS MS: no deformity or atrophy Skin: warm and dry, no rash, right radial cath site without hematoma or ecchymosis; good pulse. Neuro:  Alert and Oriented x 3, Strength and sensation are intact, follows commands Psych: euthymic mood, full affect  Wt Readings from Last 3 Encounters:  03/13/16 180 lb 12.8 oz (82.01 kg)  02/29/16 182 lb 15.7 oz (83 kg)  04/11/15 189 lb (85.73 kg)      Studies/Labs  Reviewed:   EKG:  EKG was ordered today and personally reviewed by me. The EKG ordered today demonstrates sinus bradycardia 54bpm, no acute ST-T changes.  Recent Labs: 02/27/2016: ALT 27; TSH 1.368 02/29/2016: BUN 13; Creatinine, Ser 0.88; Hemoglobin 14.7; Platelets 271; Potassium 4.0; Sodium 139   Lipid Panel    Component Value Date/Time   CHOL 177 02/28/2016 0445   TRIG 138 02/28/2016 0445   HDL 39* 02/28/2016 0445   CHOLHDL 4.5 02/28/2016 0445   VLDL 28 02/28/2016 0445   LDLCALC 110* 02/28/2016 0445    Additional studies/ records that were reviewed today include: Summarized above.    ASSESSMENT & PLAN:   1. CAD  s/p PCI as above - doing well. Continue ASA, BB, statin, Plavix. He plans to participate in cardiac rehab. He had several questions today and I did my best to answer them all. 2. PVCs - he is asymptomatic with these. Will order 24-hour event monitor to assess if any residual burden.  3. Hyperlipidemia - atorvastatin titrated in the hospital. F/u CMET/lipids in 6 weeks. 4. Leukocytosis  - suspect WBC on 5/18 was r/t pre-med for cath, but WBC 2 days before then was also elevated on pre-cath labs. Instructed him to please f/u with PCP for further eval. 5. Pre-diabetes - instructed him to please f/u with PCP for further monitoring.  Disposition: F/u with Dr. Clifton JamesMcAlhany in 3-4 months.   Medication Adjustments/Labs and Tests Ordered: Current medicines are reviewed at length with the patient today.  Concerns regarding medicines are outlined above. Medication changes, Labs and Tests ordered today are listed above.  Thomasene MohairSigned, Dayna Dunn PA-C  03/13/2016 11:03 AM    [facility] [address], [place], [place]  [address] Phone: [phone]; Fax: [phone]

## 2016-03-13 ENCOUNTER — Ambulatory Visit (INDEPENDENT_AMBULATORY_CARE_PROVIDER_SITE_OTHER): Payer: Medicare Other | Admitting: Physician Assistant

## 2016-03-13 ENCOUNTER — Ambulatory Visit (INDEPENDENT_AMBULATORY_CARE_PROVIDER_SITE_OTHER): Payer: Medicare Other

## 2016-03-13 ENCOUNTER — Encounter: Payer: Self-pay | Admitting: Physician Assistant

## 2016-03-13 VITALS — BP 120/82 | HR 54 | Ht 70.0 in | Wt 180.8 lb

## 2016-03-13 DIAGNOSIS — I493 Ventricular premature depolarization: Secondary | ICD-10-CM | POA: Diagnosis not present

## 2016-03-13 DIAGNOSIS — E785 Hyperlipidemia, unspecified: Secondary | ICD-10-CM | POA: Diagnosis not present

## 2016-03-13 DIAGNOSIS — D72829 Elevated white blood cell count, unspecified: Secondary | ICD-10-CM | POA: Diagnosis not present

## 2016-03-13 DIAGNOSIS — I2510 Atherosclerotic heart disease of native coronary artery without angina pectoris: Secondary | ICD-10-CM

## 2016-03-13 DIAGNOSIS — R7303 Prediabetes: Secondary | ICD-10-CM

## 2016-03-13 DIAGNOSIS — Z9861 Coronary angioplasty status: Secondary | ICD-10-CM

## 2016-03-13 NOTE — Patient Instructions (Addendum)
Medication Instructions:  Your physician recommends that you continue on your current medications as directed. Please refer to the Current Medication list given to you today.   Labwork: 6 WEEKS:  Your physician recommends that you return for a FASTING lipid profile & CMET   Testing/Procedures: Your physician has recommended that you wear a holter monitor. Holter monitors are medical devices that record the heart's electrical activity. Doctors most often use these monitors to diagnose arrhythmias. Arrhythmias are problems with the speed or rhythm of the heartbeat. The monitor is a small, portable device. You can wear one while you do your normal daily activities. This is usually used to diagnose what is causing palpitations/syncope (passing out).   Follow-Up: Your physician recommends that you schedule a follow-up appointment in: 3-4 MONTHS WITH DR. Clifton JamesMCALHANY   Any Other Special Instructions Will Be Listed Below (If Applicable).  PLEASE FOLLOW UP WITH YOUR PRIMARY CARE PHYSICIANTO MONITOR YOUR BLOOD SUGAR AND WHITE BLOOD BEING ELEVATED  Holter Monitoring A Holter monitor is a small device that is used to detect abnormal heart rhythms. It clips to your clothing and is connected by wires to flat, sticky disks (electrodes) that attach to your chest. It is worn continuously for 24-48 hours. HOME CARE INSTRUCTIONS  Wear your Holter monitor at all times, even while exercising and sleeping, for as long as directed by your health care provider.  Make sure that the Holter monitor is safely clipped to your clothing or close to your body as recommended by your health care provider.  Do not get the monitor or wires wet.  Do not put body lotion or moisturizer on your chest.  Keep your skin clean.  Keep a diary of your daily activities, such as walking and doing chores. If you feel that your heartbeat is abnormal or that your heart is fluttering or skipping a beat:  Record what you are doing when  it happens.  Record what time of day the symptoms occur.  Return your Holter monitor as directed by your health care provider.  Keep all follow-up visits as directed by your health care provider. This is important. SEEK IMMEDIATE MEDICAL CARE IF:  You feel lightheaded or you faint.  You have trouble breathing.  You feel pain in your chest, upper arm, or jaw.  You feel sick to your stomach and your skin is pale, cool, or damp.  You heartbeat feels unusual or abnormal.   This information is not intended to replace advice given to you by your health care provider. Make sure you discuss any questions you have with your health care provider.   Document Released: 06/28/2004 Document Revised: 10/21/2014 Document Reviewed: 05/09/2014 Elsevier Interactive Patient Education [employer]2016 [employer]    If you need a refill on your cardiac medications before your next appointment, please call your pharmacy.

## 2016-03-18 ENCOUNTER — Encounter: Payer: Self-pay | Admitting: Physician Assistant

## 2016-03-20 ENCOUNTER — Telehealth: Payer: Self-pay | Admitting: Cardiovascular Disease

## 2016-03-20 NOTE — Telephone Encounter (Signed)
Follow Up  Pt returned call. No further details.

## 2016-03-20 NOTE — Telephone Encounter (Signed)
I spoke with Travis Jacobs and reviewed monitor results with him. He is not having palpitations.  He does have shortness of breath which he reports having for many years. This comes and goes.  It has actually improved since stent placed. Wife states he is no longer snoring.  He is not having chest pain.  States he has several food allergies and always feels full.  He is followed by GI.  Travis Jacobs is starting cardiac rehab soon. He is seeing Dr. Clifton JamesMcAlhany in July. I told Travis Jacobs to contact us if shortness of breath worsens or he has other problems prior to this appt.

## 2016-04-11 ENCOUNTER — Encounter (HOSPITAL_COMMUNITY): Payer: Self-pay

## 2016-04-11 ENCOUNTER — Encounter (HOSPITAL_COMMUNITY)
Admission: RE | Admit: 2016-04-11 | Discharge: 2016-04-11 | Disposition: A | Discharge status: Home / Self Care | Payer: Medicare Other | Source: Ambulatory Visit | Attending: Cardiovascular Disease | Admitting: Cardiovascular Disease

## 2016-04-11 VITALS — BP 114/70 | HR 55 | Ht 71.0 in | Wt 174.8 lb

## 2016-04-11 DIAGNOSIS — Z955 Presence of coronary angioplasty implant and graft: Secondary | ICD-10-CM

## 2016-04-11 NOTE — Progress Notes (Signed)
Cardiac Individual Treatment Plan  Patient Details  Name: Travis Jacobs MRN: [id number] Date of Birth: 1951/02/27 Referring Provider:        CARDI[facility] REHAB [facility]ASE [facility]   Referring Provider  Earney HamburgMcAlhany, Chris MD      Initial Encounter Date:       CARDIAC REHAB PHASE II ORIENTATION [facility]11[facility]   Date  04/11/16   Referring Provider  Earney HamburgMcAlhany, Chris MD      Visit Diagnosis: Stented coronary artery  Patient's Home Medications on Admission:  Current outpatient prescriptions:  .  aspirin EC 81 MG EC tablet, Take 1 tablet (81 mg total) by mouth daily., Disp: , Rfl:  .  atorvastatin (LIPITOR) 40 MG tablet, Take 1 tablet (40 mg total) by mouth daily., Disp: 30 tablet, Rfl: 5 .  CIALIS 5 MG tablet, Take 5 mg by mouth daily as needed for erectile dysfunction. , Disp: , Rfl: 98 .  clopidogrel (PLAVIX) 75 MG tablet, Take 1 tablet (75 mg total) by mouth daily with breakfast., Disp: 30 tablet, Rfl: 11 .  docusate sodium (COLACE) 100 MG capsule, Take 500 mg by mouth daily., Disp: , Rfl:  .  halobetasol (ULTRAVATE) 0.05 % cream, Apply topically daily. , Disp: , Rfl:  .  magnesium oxide (MAG-OX) 400 MG tablet, Take 400 mg by mouth daily., Disp: , Rfl:  .  metoprolol tartrate (LOPRESSOR) 25 MG tablet, Take 1 tablet (25 mg total) by mouth 2 (two) times daily., Disp: 60 tablet, Rfl: 5 .  nitroGLYCERIN (NITROSTAT) 0.4 MG SL tablet, Place 1 tablet (0.4 mg total) under the tongue every 5 (five) minutes x 3 doses as needed for chest pain., Disp: 25 tablet, Rfl: 2 .  pantoprazole (PROTONIX) 40 MG tablet, Take 1 tablet (40 mg total) by mouth daily., Disp: 30 tablet, Rfl: 5 .  Probiotic Product (PROBIOTIC DAILY PO), Take 1 capsule by mouth daily. , Disp: , Rfl:  .  triamcinolone (NASACORT ALLERGY 24HR) 55 MCG/ACT AERO nasal inhaler, Place 2 sprays into the nose daily., Disp: , Rfl:   Past  Medical History: Past Medical History  Diagnosis Date  . GERD (gastroesophageal reflux disease)   . Esophageal stricture   . Hemorrhoids   . Hiatal hernia   . Diverticulosis   . IBS (irritable bowel syndrome)   . Hyperlipidemia   . Cataract     bil cateracts  . Multiple food allergies     "lots of food allergies; beef, tuna, flounder, potatoes, garlic, tomatoes, apples, bananas, sesame seeds, shellfish  . CAD (coronary artery disease)     a. DES to OM1 02/2016.   Marland Kitchen. PVC's (premature ventricular contractions)   . Pre-diabetes     Tobacco Use: History  Smoking status  . Former Smoker -- 1.00 packs/day for 20 years  . Types: Cigarettes  Smokeless tobacco  . Never Used    Comment: "quit smoking cigarettes in the 1980s"    Labs:     Recent Review Flowsheet Data    Labs for ITP Cardiac and Pulmonary Rehab Latest Ref Rng 02/27/2016 02/28/2016   Cholestrol 0 - 200 mg/dL - 409177   LDLCALC 0 - 99 mg/dL - 811(B110(H)   HDL >14>40 mg/dL - 78(G39(L)   Trlycerides <956<150 mg/dL - 213138   Hemoglobin Y8MA1c 4.8 - 5.6 % 6.1(H) -      Capillary Blood Glucose: No results found for: GLUCAP  Exercise Target Goals: Date: 04/11/16  Exercise Program Goal: Individual exercise prescription set with THRR, safety & activity barriers. Participant demonstrates ability to understand and report RPE using BORG scale, to self-measure pulse accurately, and to acknowledge the importance of the exercise prescription.  Exercise Prescription Goal: Starting with aerobic activity 30 plus minutes a day, 3 days per week for initial exercise prescription. Provide home exercise prescription and guidelines that participant acknowledges understanding prior to discharge.  Activity Barriers & Risk Stratification:     Activity Barriers & Cardiac Risk Stratification - 04/11/16 0924    Activity Barriers & Cardiac Risk Stratification   Activity Barriers None      6 Minute Walk:     6 Minute Walk      04/11/16 1240       6  Minute Walk   Phase Initial     Distance 1852 feet     Walk Time 6 minutes     # of Rest Breaks 0     MPH 3.5     METS 4.3     RPE 13     Perceived Dyspnea  2     VO2 Peak 14.9     Symptoms Yes (comment)     Comments mild to mod SOB     Resting HR 55 bpm     Resting BP 114/70 mmHg     Max Ex. HR 88 bpm     Max Ex. BP 134/80 mmHg     2 Minute Post BP 108/70 mmHg        Initial Exercise Prescription:     Initial Exercise Prescription - 04/11/16 1200    Date of Initial Exercise RX and Referring Provider   Date 04/11/16   Referring Provider Earney Hamburg MD   Treadmill   MPH 2.8   Grade 1   Minutes 10   METs 3.53   Bike   Level 0.8   Minutes 10   METs 3   NuStep   Level 3   Minutes 10   METs 3   Prescription Details   Frequency (times per week) 3   Duration Progress to 30 minutes of continuous aerobic without signs/symptoms of physical distress   Intensity   THRR 40-80% of Max Heartrate 62-123   Ratings of Perceived Exertion 11-13   Perceived Dyspnea 0-4   Progression   Progression Continue to progress workloads to maintain intensity without signs/symptoms of physical distress.   Resistance Training   Training Prescription Yes   Weight 3lbs   Reps 10-12      Perform Capillary Blood Glucose checks as needed.  Exercise Prescription Changes:   Exercise Comments:   Discharge Exercise Prescription (Final Exercise Prescription Changes):   Nutrition:  Target Goals: Understanding of nutrition guidelines, daily intake of sodium 1500mg , cholesterol 200mg , calories 30% from fat and 7% or less from saturated fats, daily to have 5 or more servings of fruits and vegetables.  Biometrics:     Pre Biometrics - 04/11/16 1249    Pre Biometrics   Waist Circumference 38.5 inches   Hip Circumference 40.5 inches   Waist to Hip Ratio 0.95 %   Triceps Skinfold 20 mm   % Body Fat 26.5 %   Grip Strength 46.5 kg   Flexibility 9 in   Single Leg Stand 16.09 seconds        Nutrition Therapy Plan and Nutrition Goals:   Nutrition Discharge: Nutrition Scores:   Nutrition Goals Re-Evaluation:   Psychosocial:  Target Goals: Acknowledge presence or absence of depression, maximize coping skills, provide positive support system. Participant is able to verbalize types and ability to use techniques and skills needed for reducing stress and depression.  Initial Review & Psychosocial Screening:     Initial Psych Review & Screening - 04/11/16 1416    Family Dynamics   Good Support System? Yes   Barriers   Psychosocial barriers to participate in program There are no identifiable barriers or psychosocial needs.   Screening Interventions   Interventions Encouraged to exercise      Quality of Life Scores:     Quality of Life - 04/11/16 1250    Quality of Life Scores   Health/Function Pre 26.77 %   Socioeconomic Pre 30 %   Psych/Spiritual Pre 30 %   Family Pre 30 %   GLOBAL Pre 28.69 %      PHQ-9:     Recent Review Flowsheet Data    There is no flowsheet data to display.      Psychosocial Evaluation and Intervention:   Psychosocial Re-Evaluation:   Vocational Rehabilitation: Provide vocational rehab assistance to qualifying candidates.   Vocational Rehab Evaluation & Intervention:     Vocational Rehab - 04/11/16 1415    Initial Vocational Rehab Evaluation & Intervention   Assessment shows need for Vocational Rehabilitation No      Education: Education Goals: Education classes will be provided on a weekly basis, covering required topics. Participant will state understanding/return demonstration of topics presented.  Learning Barriers/Preferences:     Learning Barriers/Preferences - 04/11/16 1411    Learning Barriers/Preferences   Learning Barriers None  no learning barriers noted   Learning Preferences Pictoral;Written Material      Education Topics: Count Your Pulse:  -Group instruction provided by verbal  instruction, demonstration, patient participation and written materials to support subject.  Instructors address importance of being able to find your pulse and how to count your pulse when at home without a heart monitor.  Patients get hands on experience counting their pulse with staff help and individually.   Heart Attack, Angina, and Risk Factor Modification:  -Group instruction provided by verbal instruction, video, and written materials to support subject.  Instructors address signs and symptoms of angina and heart attacks.    Also discuss risk factors for heart disease and how to make changes to improve heart health risk factors.   Functional Fitness:  -Group instruction provided by verbal instruction, demonstration, patient participation, and written materials to support subject.  Instructors address safety measures for doing things around the house.  Discuss how to get up and down off the floor, how to pick things up properly, how to safely get out of a chair without assistance, and balance training.   Meditation and Mindfulness:  -Group instruction provided by verbal instruction, patient participation, and written materials to support subject.  Instructor addresses importance of mindfulness and meditation practice to help reduce stress and improve awareness.  Instructor also leads participants through a meditation exercise.    Stretching for Flexibility and Mobility:  -Group instruction provided by verbal instruction, patient participation, and written materials to support subject.  Instructors lead participants through series of stretches that are designed to increase flexibility thus improving mobility.  These stretches are additional exercise for major muscle groups that are typically performed during regular warm up and cool down.   Hands Only CPR Anytime:  -Group instruction provided by verbal instruction, video, patient participation and written materials to support subject.  Instructors co-teach with AHA video for hands only CPR.  Participants get hands on experience with mannequins.   Nutrition I class: Heart Healthy Eating:  -Group instruction provided by PowerPoint slides, verbal discussion, and written materials to support subject matter. The instructor gives an explanation and review of the Therapeutic Lifestyle Changes diet recommendations, which includes a discussion on lipid goals, dietary fat, sodium, fiber, plant stanol/sterol esters, sugar, and the components of a well-balanced, healthy diet.   Nutrition II class: Lifestyle Skills:  -Group instruction provided by PowerPoint slides, verbal discussion, and written materials to support subject matter. The instructor gives an explanation and review of label reading, grocery shopping for heart health, heart healthy recipe modifications, and ways to make healthier choices when eating out.   Diabetes Question & Answer:  -Group instruction provided by PowerPoint slides, verbal discussion, and written materials to support subject matter. The instructor gives an explanation and review of diabetes co-morbidities, pre- and post-prandial blood glucose goals, pre-exercise blood glucose goals, signs, symptoms, and treatment of hypoglycemia and hyperglycemia, and foot care basics.   Diabetes Blitz:  -Group instruction provided by PowerPoint slides, verbal discussion, and written materials to support subject matter. The instructor gives an explanation and review of the physiology behind type 1 and type 2 diabetes, diabetes medications and rational behind using different medications, pre- and post-prandial blood glucose recommendations and Hemoglobin A1c goals, diabetes diet, and exercise including blood glucose guidelines for exercising safely.    Portion Distortion:  -Group instruction provided by PowerPoint slides, verbal discussion, written materials, and food models to support subject matter. The instructor gives an  explanation of serving size versus portion size, changes in portions sizes over the last 20 years, and what consists of a serving from each food group.   Stress Management:  -Group instruction provided by verbal instruction, video, and written materials to support subject matter.  Instructors review role of stress in heart disease and how to cope with stress positively.     Exercising on Your Own:  -Group instruction provided by verbal instruction, power point, and written materials to support subject.  Instructors discuss benefits of exercise, components of exercise, frequency and intensity of exercise, and end points for exercise.  Also discuss use of nitroglycerin and activating EMS.  Review options of places to exercise outside of rehab.  Review guidelines for sex with heart disease.   Cardiac Drugs I:  -Group instruction provided by verbal instruction and written materials to support subject.  Instructor reviews cardiac drug classes: antiplatelets, anticoagulants, beta blockers, and statins.  Instructor discusses reasons, side effects, and lifestyle considerations for each drug class.   Cardiac Drugs II:  -Group instruction provided by verbal instruction and written materials to support subject.  Instructor reviews cardiac drug classes: angiotensin converting enzyme inhibitors (ACE-I), angiotensin II receptor blockers (ARBs), nitrates, and calcium channel blockers.  Instructor discusses reasons, side effects, and lifestyle considerations for each drug class.   Anatomy and Physiology of the Circulatory System:  -Group instruction provided by verbal instruction, video, and written materials to support subject.  Reviews functional anatomy of heart, how it relates to various diagnoses, and what role the heart plays in the overall system.   Knowledge Questionnaire Score:     Knowledge Questionnaire Score - 04/11/16 1239    Knowledge Questionnaire Score   Pre Score 6/24      Core  Components/Risk Factors/Patient Goals at Admission:     Personal Goals and Risk Factors at Admission - 04/11/16 1610  Core Components/Risk Factors/Patient Goals on Admission    Weight Management Yes;Weight Loss   Intervention Weight Management: Develop a combined nutrition and exercise program designed to reach desired caloric intake, while maintaining appropriate intake of nutrient and fiber, sodium and fats, and appropriate energy expenditure required for the weight goal.;Weight Management: Provide education and appropriate resources to help participant work on and attain dietary goals.;Weight Management/Obesity: Establish reasonable short term and long term weight goals.   Expected Outcomes Short Term: Continue to assess and modify interventions until short term weight is achieved;Long Term: Adherence to nutrition and physical activity/exercise program aimed toward attainment of established weight goal;Weight Maintenance: Understanding of the daily nutrition guidelines, which includes 25-35% calories from fat, 7% or less cal from saturated fats, less than  cholesterol, less than 1.5gm of sodium, & 5 or more servings of fruits and vegetables daily;Weight Loss: Understanding of general recommendations for a balanced deficit meal plan, which promotes 1-2 lb weight loss per week and includes a negative energy balance of [phone] kcal/d   Increase Strength and Stamina Yes   Intervention Develop an individualized exercise prescription for aerobic and resistive training based on initial evaluation findings, risk stratification, comorbidities and participant's personal goals.;Provide advice, education, support and counseling about physical activity/exercise needs.   Expected Outcomes Achievement of increased cardiorespiratory fitness and enhanced flexibility, muscular endurance and strength shown through measurements of functional capacity and personal statement of participant.   Improve shortness of  breath with ADL's Yes   Intervention Provide education, individualized exercise plan and daily activity instruction to help decrease symptoms of SOB with activities of daily living.   Expected Outcomes Short Term: Achieves a reduction of symptoms when performing activities of daily living.   Lipids Yes   Intervention Provide education and support for participant on nutrition & aerobic/resistive exercise along with prescribed medications to achieve LDL 70mg , HDL >40mg .   Expected Outcomes Short Term: Participant states understanding of desired cholesterol values and is compliant with medications prescribed. Participant is following exercise prescription and nutrition guidelines.;Long Term: Cholesterol controlled with medications as prescribed, with individualized exercise RX and with personalized nutrition plan. Value goals: LDL < , HDL > 40 mg.   Stress Yes   Intervention Offer individual and/or small group education and counseling on adjustment to heart disease, stress management and health-related lifestyle change. Teach and support self-help strategies.;Refer participants experiencing significant psychosocial distress to appropriate mental health specialists for further evaluation and treatment. When possible, include family members and significant others in education/counseling sessions.   Expected Outcomes Long Term: Emotional wellbeing is indicated by absence of clinically significant psychosocial distress or social isolation.;Short Term: Participant demonstrates changes in health-related behavior, relaxation and other stress management skills, ability to obtain effective social support, and compliance with psychotropic medications if prescribed.   Personal Goal Other Yes   Personal Goal short - more energy, reduce SOB and lose 5lbs   Intervention Provide exercise education and guidelines to assist with energy levels and weightloss   Expected Outcomes Pt will be able to lose 5lbs and have  increased energy      Core Components/Risk Factors/Patient Goals Review:      Goals and Risk Factor Review      04/11/16 1418           Core Components/Risk Factors/Patient Goals Review   Personal Goals Review Weight Management/Obesity;Lipids          Core Components/Risk Factors/Patient Goals at Discharge (Final Review):      Goals and  Risk Factor Review - 04/11/16 1418    Core Components/Risk Factors/Patient Goals Review   Personal Goals Review Weight Management/Obesity;Lipids      ITP Comments:     ITP Comments      04/11/16 0922           ITP Comments Dr. Armanda Magicraci Turner, Medical Director          Comments: Patient attended orientation from 0800 to 1000  to review rules and guidelines for program. Completed 6 minute walk test, Intitial ITP, and exercise prescription.  VSS. Telemetry-Sinus rhythm with a rare PVC noted.  Asymptomatic.Gladstone LighterMaria Tenae Graziosi, RN,BSN 04/11/2016 2:49 PM

## 2016-04-11 NOTE — Progress Notes (Signed)
Cardiac Rehab Medication Review by a Pharmacist  Does the patient  feel that his/her medications are working for him/her?  no  Has the patient been experiencing any side effects to the medications prescribed?  Yes, lightheadedness  Does the patient measure his/her own blood pressure or blood glucose at home?  no   Does the patient have any problems obtaining medications due to transportation or finances?   no  Understanding of regimen: excellent Understanding of indications: excellent Potential of compliance: excellent    Pharmacist comments: Mr. Travis Jacobs is a pleasant [age] yom presenting to cardiac rehab after stent placement. HE has a good understanding of his medication regimen and rarely misses any doses. We reviewed his regimen and discussed medication indications. He has been experiencing lightheadedness with metoprolol (has been on for 6 weeks now). He did not have any other medication-related questions or concerns at this time.  Travis Jacobs C. Marvis MoellerMiles, PharmD Pharmacy Resident  Pager: [phone] 04/11/2016 9:12 AM

## 2016-04-15 ENCOUNTER — Encounter (HOSPITAL_COMMUNITY): Payer: Medicare Other

## 2016-04-17 ENCOUNTER — Encounter (HOSPITAL_COMMUNITY)
Admission: RE | Admit: 2016-04-17 | Discharge: 2016-04-17 | Disposition: A | Discharge status: Home / Self Care | Payer: Medicare Other | Source: Ambulatory Visit | Attending: Cardiovascular Disease | Admitting: Cardiovascular Disease

## 2016-04-17 DIAGNOSIS — Z955 Presence of coronary angioplasty implant and graft: Secondary | ICD-10-CM | POA: Diagnosis not present

## 2016-04-17 NOTE — Progress Notes (Signed)
Daily Session Note  Patient Details  Name: Travis Jacobs MRN: [id number] Date of Birth: [birth year] Referring Provider:        CARDIAC REHAB PHASE II ORIENTATION from 04/11/2016 in [place]   Referring Provider  Darlina Guys MD      Encounter Date: 04/17/2016  Check In:     Session Check In - 04/17/16 0717    Check-In   Location [facility]   Staff Present Andi Hence, RN, BSN;Carlette Carlton, RN, Deland Pretty, MS, ACSM CEP, Exercise Physiologist   Supervising physician immediately available to respond to emergencies [facility] immediately available   Physician(s) Dr.Carter   Medication changes reported     No   Fall or balance concerns reported    No   Warm-up and Cool-down Performed as group-led instruction   Resistance Training Performed No   VAD Patient? No   Pain Assessment   Currently in Pain? No/denies      Capillary Blood Glucose: No results found for this or any previous visit (from the past 24 hour(s)).   Goals Met:  Exercise tolerated well Personal goals reviewed  Goals Unmet:  Not Applicable  Comments: Pt started full day of exercise at the Phase II cardiac rehab program.  Pt tolerated light exercise without difficulty. VSS, telemetry-SR with occasional PVCs, asymptomatic.  Medication list reconciled. Pt denies barriers to medicaiton compliance.  PSYCHOSOCIAL ASSESSMENT:  PHQ-0. Pt exhibits positive coping skills, hopeful outlook with supportive family. No psychosocial needs identified at this time, no psychosocial interventions necessary.    Pt enjoys work.  Pt owns a fur company.Pt desires to gain more energy and reduce shortness of breath.  Pt long term goal is to loose 5 pounds.    Pt oriented to exercise equipment and routine.    Understanding verbalized.[name] RN, BSN     Dr. Fransico Him is Medical Director for Cardiac Rehab at [facility].

## 2016-04-19 ENCOUNTER — Encounter (HOSPITAL_COMMUNITY)
Admission: RE | Admit: 2016-04-19 | Discharge: 2016-04-19 | Disposition: A | Discharge status: Home / Self Care | Payer: Medicare Other | Source: Ambulatory Visit | Attending: Cardiovascular Disease | Admitting: Cardiovascular Disease

## 2016-04-19 ENCOUNTER — Encounter (HOSPITAL_COMMUNITY): Payer: Medicare Other

## 2016-04-19 DIAGNOSIS — Z955 Presence of coronary angioplasty implant and graft: Secondary | ICD-10-CM

## 2016-04-19 NOTE — Progress Notes (Signed)
Daily Session Note  Patient Details  Name: Travis Jacobs MRN: [id number] Date of Birth: [birth year] Referring Provider:        CARDIAC REHAB PHASE II ORIENTATION from 04/11/2016 in [place]   Referring Provider  Darlina Guys MD      Encounter Date: 04/19/2016  Check In:     Session Check In - 04/19/16 0705    Check-In   Location [facility]   Staff Present Su Hilt, MS, ACSM RCEP, Exercise Physiologist;Carlette Wilber Oliphant, RN, BSN;Joann Rion, RN, [employer] [place] [employer], MS, ACSM RCEP, Exercise Physiologist   Supervising physician immediately available to respond to emergencies [facility] immediately available   Physician(s) Dr. Marthenia Rolling   Medication changes reported     No   Fall or balance concerns reported    No   Warm-up and Cool-down Performed as group-led instruction   Resistance Training Performed Yes   VAD Patient? No   Pain Assessment   Currently in Pain? No/denies   Multiple Pain Sites No      Capillary Blood Glucose: No results found for this or any previous visit (from the past 24 hour(s)).   Goals Met:  Exercise tolerated well No report of cardiac concerns or symptoms  Goals Unmet:  Not Applicable  Comments:  QUALITY OF LIFE SCORE REVIEW  Pt completed Quality of Life survey as a participant in Cardiac Rehab. Scores 21.0 or below are considered low. Pt scored the following     Quality of Life - 04/11/16 1250    Quality of Life Scores   Health/Function Pre 26.77 %   Socioeconomic Pre 30 %   Psych/Spiritual Pre 30 %   Family Pre 30 %   GLOBAL Pre 28.69 %    Pt scored well above the low threshold.  Pt demonstrates positive and heathy coping skills with good support systems in place. Pt denies any psychosocial needs at this time.   Will continue to monitor and intervene as necessary. [name] RN, BSN      Dr. Fransico Him is Medical Director for Cardiac Rehab at [facility].

## 2016-04-22 ENCOUNTER — Encounter (HOSPITAL_COMMUNITY)
Admission: RE | Admit: 2016-04-22 | Discharge: 2016-04-22 | Disposition: A | Discharge status: Home / Self Care | Payer: Medicare Other | Source: Ambulatory Visit | Attending: Cardiovascular Disease | Admitting: Cardiovascular Disease

## 2016-04-22 DIAGNOSIS — Z955 Presence of coronary angioplasty implant and graft: Secondary | ICD-10-CM | POA: Diagnosis not present

## 2016-04-24 ENCOUNTER — Encounter (HOSPITAL_COMMUNITY)
Admission: RE | Admit: 2016-04-24 | Discharge: 2016-04-24 | Disposition: A | Discharge status: Home / Self Care | Payer: Medicare Other | Source: Ambulatory Visit | Attending: Cardiovascular Disease | Admitting: Cardiovascular Disease

## 2016-04-24 ENCOUNTER — Other Ambulatory Visit (INDEPENDENT_AMBULATORY_CARE_PROVIDER_SITE_OTHER): Payer: Medicare Other | Admitting: *Deleted

## 2016-04-24 DIAGNOSIS — Z9861 Coronary angioplasty status: Secondary | ICD-10-CM | POA: Diagnosis not present

## 2016-04-24 DIAGNOSIS — I2510 Atherosclerotic heart disease of native coronary artery without angina pectoris: Secondary | ICD-10-CM

## 2016-04-24 DIAGNOSIS — Z955 Presence of coronary angioplasty implant and graft: Secondary | ICD-10-CM | POA: Diagnosis not present

## 2016-04-24 DIAGNOSIS — E785 Hyperlipidemia, unspecified: Secondary | ICD-10-CM

## 2016-04-24 LAB — COMPREHENSIVE METABOLIC PANEL
ALK PHOS: 74 U/L (ref 40–115)
ALT: 24 U/L (ref 9–46)
AST: 20 U/L (ref 10–35)
Albumin: 4 g/dL (ref 3.6–5.1)
BILIRUBIN TOTAL: 0.6 mg/dL (ref 0.2–1.2)
BUN: 15 mg/dL (ref 7–25)
CO2: 26 mmol/L (ref 20–31)
Calcium: 9.6 mg/dL (ref 8.6–10.3)
Chloride: 104 mmol/L (ref 98–110)
Creat: 0.86 mg/dL (ref 0.70–1.25)
GLUCOSE: 104 mg/dL — AB (ref 65–99)
Potassium: 4.3 mmol/L (ref 3.5–5.3)
Sodium: 139 mmol/L (ref 135–146)
Total Protein: 6.6 g/dL (ref 6.1–8.1)

## 2016-04-24 LAB — LIPID PANEL
CHOL/HDL RATIO: 3.4 ratio (ref <=5.0)
Cholesterol: 124 mg/dL — ABNORMAL LOW (ref 125–200)
HDL: 36 mg/dL — ABNORMAL LOW (ref >=40)
LDL Cholesterol: 65 mg/dL (ref <130)
Triglycerides: 113 mg/dL (ref <150)
VLDL: 23 mg/dL (ref <30)

## 2016-04-25 ENCOUNTER — Telehealth: Payer: Self-pay | Admitting: Physician Assistant

## 2016-04-25 NOTE — Telephone Encounter (Signed)
F/u  Pt returning RN phone call- lab results. Please call back and discuss.  a

## 2016-04-25 NOTE — Telephone Encounter (Signed)
Follow-up ° ° ° ° ° °The pt is returning Jennifer's call °

## 2016-04-25 NOTE — Telephone Encounter (Signed)
Pt called back while I was discharging a pt and couldn't take the call.  Returned call to pt and left another message for pt to call back and let me know when a good time is to reach him.

## 2016-04-25 NOTE — Telephone Encounter (Signed)
Returned pts call.  Discussed lab results.

## 2016-04-26 ENCOUNTER — Encounter (HOSPITAL_COMMUNITY)
Admission: RE | Admit: 2016-04-26 | Discharge: 2016-04-26 | Disposition: A | Discharge status: Home / Self Care | Payer: Medicare Other | Source: Ambulatory Visit | Attending: Cardiovascular Disease | Admitting: Cardiovascular Disease

## 2016-04-26 DIAGNOSIS — Z955 Presence of coronary angioplasty implant and graft: Secondary | ICD-10-CM | POA: Diagnosis not present

## 2016-04-29 ENCOUNTER — Encounter (HOSPITAL_COMMUNITY)
Admission: RE | Admit: 2016-04-29 | Discharge: 2016-04-29 | Disposition: A | Discharge status: Home / Self Care | Payer: Medicare Other | Source: Ambulatory Visit | Attending: Cardiovascular Disease | Admitting: Cardiovascular Disease

## 2016-04-29 DIAGNOSIS — Z955 Presence of coronary angioplasty implant and graft: Secondary | ICD-10-CM

## 2016-05-01 ENCOUNTER — Encounter (HOSPITAL_COMMUNITY)
Admission: RE | Admit: 2016-05-01 | Discharge: 2016-05-01 | Disposition: A | Discharge status: Home / Self Care | Payer: Medicare Other | Source: Ambulatory Visit | Attending: Cardiovascular Disease | Admitting: Cardiovascular Disease

## 2016-05-01 DIAGNOSIS — Z955 Presence of coronary angioplasty implant and graft: Secondary | ICD-10-CM

## 2016-05-02 ENCOUNTER — Encounter: Payer: Self-pay | Admitting: Internal Medicine

## 2016-05-02 ENCOUNTER — Ambulatory Visit (INDEPENDENT_AMBULATORY_CARE_PROVIDER_SITE_OTHER): Payer: Medicare Other | Admitting: Internal Medicine

## 2016-05-02 VITALS — BP 122/78 | HR 63 | Ht 70.0 in | Wt 175.4 lb

## 2016-05-02 DIAGNOSIS — I493 Ventricular premature depolarization: Secondary | ICD-10-CM

## 2016-05-02 NOTE — Progress Notes (Signed)
Cardiac Individual Treatment Plan  Patient Details  Name: Travis Jacobs MRN: [id number] Date of Birth: 08-08-1951 Referring Provider:        CARDIAC REHAB PHASE II ORIENTATION from 04/11/2016 in [facility] CARDIAC [facility]   Referring Provider  Earney Hamburg MD      Initial Encounter Date:       CARDIAC REHAB PHASE II ORIENTATION from 04/11/2016 in [facility] CARDIAC REHAB   Date  04/11/16   Referring Provider  Earney Hamburg MD      Visit Diagnosis: Stented coronary artery  Patient's Home Medications on Admission:  Current outpatient prescriptions:  .  aspirin EC 81 MG EC tablet, Take 1 tablet (81 mg total) by mouth daily., Disp: , Rfl:  .  atorvastatin (LIPITOR) 40 MG tablet, Take 1 tablet (40 mg total) by mouth daily., Disp: 30 tablet, Rfl: 5 .  CIALIS 5 MG tablet, Take 5 mg by mouth daily as needed for erectile dysfunction. , Disp: , Rfl: 98 .  clopidogrel (PLAVIX) 75 MG tablet, Take 1 tablet (75 mg total) by mouth daily with breakfast., Disp: 30 tablet, Rfl: 11 .  docusate sodium (COLACE) 100 MG capsule, Take 500 mg by mouth daily., Disp: , Rfl:  .  halobetasol (ULTRAVATE) 0.05 % cream, Apply 1 application topically daily. , Disp: , Rfl:  .  magnesium oxide (MAG-OX) 400 MG tablet, Take 400 mg by mouth daily., Disp: , Rfl:  .  metoprolol tartrate (LOPRESSOR) 25 MG tablet, Take 1 tablet (25 mg total) by mouth 2 (two) times daily., Disp: 60 tablet, Rfl: 5 .  nitroGLYCERIN (NITROSTAT) 0.4 MG SL tablet, Place 1 tablet (0.4 mg total) under the tongue every 5 (five) minutes x 3 doses as needed for chest pain., Disp: 25 tablet, Rfl: 2 .  pantoprazole (PROTONIX) 40 MG tablet, Take 1 tablet (40 mg total) by mouth daily., Disp: 30 tablet, Rfl: 5 .  Probiotic Product (PROBIOTIC DAILY PO), Take 1 capsule by mouth daily. , Disp: , Rfl:  .  triamcinolone (NASACORT ALLERGY 24HR) 55 MCG/ACT AERO nasal inhaler, Place 2 sprays into the nose daily., Disp: ,  Rfl:   Past Medical History: Past Medical History  Diagnosis Date  . GERD (gastroesophageal reflux disease)   . Esophageal stricture   . Hemorrhoids   . Hiatal hernia   . Diverticulosis   . IBS (irritable bowel syndrome)   . Hyperlipidemia   . Cataract     bil cateracts  . Multiple food allergies     "lots of food allergies; beef, tuna, flounder, potatoes, garlic, tomatoes, apples, bananas, sesame seeds, shellfish  . CAD (coronary artery disease)     a. DES to OM1 02/2016.   Marland Kitchen PVC's (premature ventricular contractions)   . Pre-diabetes     Tobacco Use: History  Smoking status  . Former Smoker -- 1.00 packs/day for 20 years  . Types: Cigarettes  Smokeless tobacco  . Never Used    Comment: "quit smoking cigarettes in the 1980s"    Labs: Recent Review Flowsheet Data    Labs for ITP Cardiac and Pulmonary Rehab Latest Ref Rng 02/27/2016 02/28/2016 04/24/2016   Cholestrol 125 - 200 mg/dL - 409 811(B)   LDLCALC <130 mg/dL - 147(W) 65   HDL >=29 mg/dL - 56(O) 13(Y)   Trlycerides <150 mg/dL - 865 784   Hemoglobin A1c 4.8 - 5.6 % 6.1(H) - -      Capillary Blood Glucose: No results found for: GLUCAP  Exercise Target Goals:    Exercise Program Goal: Individual exercise prescription set with THRR, safety & activity barriers. Participant demonstrates ability to understand and report RPE using BORG scale, to self-measure pulse accurately, and to acknowledge the importance of the exercise prescription.  Exercise Prescription Goal: Starting with aerobic activity 30 plus minutes a day, 3 days per week for initial exercise prescription. Provide home exercise prescription and guidelines that participant acknowledges understanding prior to discharge.  Activity Barriers & Risk Stratification:     Activity Barriers & Cardiac Risk Stratification - 04/11/16 1520    Activity Barriers & Cardiac Risk Stratification   Cardiac Risk Stratification Moderate      6 Minute Walk:     6  Minute Walk      04/11/16 1240       6 Minute Walk   Phase Initial     Distance 1852 feet     Walk Time 6 minutes     # of Rest Breaks 0     MPH 3.5     METS 4.3     RPE 13     Perceived Dyspnea  2     VO2 Peak 14.9     Symptoms Yes (comment)     Comments mild to mod SOB     Resting HR 55 bpm     Resting BP 114/70 mmHg     Max Ex. HR 88 bpm     Max Ex. BP 134/80 mmHg     2 Minute Post BP 108/70 mmHg        Initial Exercise Prescription:     Initial Exercise Prescription - 04/11/16 1200    Date of Initial Exercise RX and Referring Provider   Date 04/11/16   Referring Provider Earney HamburgMcAlhany, Chris MD   Treadmill   MPH 2.8   Grade 1   Minutes 10   METs 3.53   Bike   Level 0.8   Minutes 10   METs 3   NuStep   Level 3   Minutes 10   METs 3   Prescription Details   Frequency (times per week) 3   Duration Progress to 30 minutes of continuous aerobic without signs/symptoms of physical distress   Intensity   THRR 40-80% of Max Heartrate 62-123   Ratings of Perceived Exertion 11-13   Perceived Dyspnea 0-4   Progression   Progression Continue to progress workloads to maintain intensity without signs/symptoms of physical distress.   Resistance Training   Training Prescription Yes   Weight 3lbs   Reps 10-12      Perform Capillary Blood Glucose checks as needed.  Exercise Prescription Changes:     Exercise Prescription Changes      04/22/16 1700 05/01/16 1600         Exercise Review   Progression Yes Yes      Response to Exercise   Blood Pressure (Admit) 108/62 mmHg 116/70 mmHg      Blood Pressure (Exercise) 128/70 mmHg 120/62 mmHg      Blood Pressure (Exit) 104/68 mmHg 100/60 mmHg      Heart Rate (Admit) 64 bpm 65 bpm      Heart Rate (Exercise) 91 bpm 93 bpm      Heart Rate (Exit) 64 bpm 64 bpm      Rating of Perceived Exertion (Exercise) 13 14      Duration Progress to 30 minutes of continuous aerobic without signs/symptoms of physical distress Progress  to 30 minutes of continuous  aerobic without signs/symptoms of physical distress      Intensity THRR unchanged THRR unchanged      Progression   Progression Continue to progress workloads to maintain intensity without signs/symptoms of physical distress. Continue to progress workloads to maintain intensity without signs/symptoms of physical distress.      Average METs 3.1 3.7      Resistance Training   Training Prescription Yes Yes      Weight 4lbs 4lbs      Reps 10-12 10-12      Treadmill   MPH 2.8 3      Grade 1 2      Minutes 19 10      METs 3.53       Recumbant Bike   Level 3 3.5      Watts  50      Minutes 10 10      METs 2.3 2.3      NuStep   Level 3 3      Minutes 10 10      METs 3.1 4.1      Home Exercise Plan   Plans to continue exercise at  Home  HEP was reviewed on 04/29/16 see progress note      Frequency  Add 2 additional days to program exercise sessions.         Exercise Comments:   Discharge Exercise Prescription (Final Exercise Prescription Changes):     Exercise Prescription Changes - 05/01/16 1600    Exercise Review   Progression Yes   Response to Exercise   Blood Pressure (Admit) 116/70 mmHg   Blood Pressure (Exercise) 120/62 mmHg   Blood Pressure (Exit) 100/60 mmHg   Heart Rate (Admit) 65 bpm   Heart Rate (Exercise) 93 bpm   Heart Rate (Exit) 64 bpm   Rating of Perceived Exertion (Exercise) 14   Duration Progress to 30 minutes of continuous aerobic without signs/symptoms of physical distress   Intensity THRR unchanged   Progression   Progression Continue to progress workloads to maintain intensity without signs/symptoms of physical distress.   Average METs 3.7   Resistance Training   Training Prescription Yes   Weight 4lbs   Reps 10-12   Treadmill   MPH 3   Grade 2   Minutes 10   Recumbant Bike   Level 3.5   Watts 50   Minutes 10   METs 2.3   NuStep   Level 3   Minutes 10   METs 4.1   Home Exercise Plan   Plans to continue  exercise at Home  HEP was reviewed on 04/29/16 see progress note   Frequency Add 2 additional days to program exercise sessions.      Nutrition:  Target Goals: Understanding of nutrition guidelines, daily intake of sodium 1500mg , cholesterol 200mg , calories 30% from fat and 7% or less from saturated fats, daily to have 5 or more servings of fruits and vegetables.  Biometrics:     Pre Biometrics - 04/11/16 1249    Pre Biometrics   Waist Circumference 38.5 inches   Hip Circumference 40.5 inches   Waist to Hip Ratio 0.95 %   Triceps Skinfold 20 mm   % Body Fat 26.5 %   Grip Strength 46.5 kg   Flexibility 9 in   Single Leg Stand 16.09 seconds       Nutrition Therapy Plan and Nutrition Goals:     Nutrition Therapy & Goals - 04/12/16 1347    Nutrition Therapy  Diet Therapeutic Lifestyle Changes   Personal Nutrition Goals   Personal Goal #1 1-2 lb wt loss per week to pt's goal wt of 169 lb (5 lb decrease from admission wt)   Personal Goal #2 Knowledge re: basic diabetic diet principles   Intervention Plan   Intervention Prescribe, educate and counsel regarding individualized specific dietary modifications aiming towards targeted core components such as weight, hypertension, lipid management, diabetes, heart failure and other comorbidities.   Expected Outcomes Short Term Goal: Understand basic principles of dietary content, such as calories, fat, sodium, cholesterol and nutrients.;Long Term Goal: Adherence to prescribed nutrition plan.      Nutrition Discharge: Nutrition Scores:     Nutrition Assessments - 04/22/16 1519    MEDFICTS Scores   Pre Score 12      Nutrition Goals Re-Evaluation:   Psychosocial: Target Goals: Acknowledge presence or absence of depression, maximize coping skills, provide positive support system. Participant is able to verbalize types and ability to use techniques and skills needed for reducing stress and depression.  Initial Review &  Psychosocial Screening:     Initial Psych Review & Screening - 04/11/16 1416    Family Dynamics   Good Support System? Yes   Barriers   Psychosocial barriers to participate in program There are no identifiable barriers or psychosocial needs.   Screening Interventions   Interventions Encouraged to exercise      Quality of Life Scores:     Quality of Life - 04/11/16 1250    Quality of Life Scores   Health/Function Pre 26.77 %   Socioeconomic Pre 30 %   Psych/Spiritual Pre 30 %   Family Pre 30 %   GLOBAL Pre 28.69 %      PHQ-9:     Recent Review Flowsheet Data    Depression screen PHQ 2/9 04/17/2016   Decreased Interest 0   Down, Depressed, Hopeless 0   PHQ - 2 Score 0      Psychosocial Evaluation and Intervention:   Psychosocial Re-Evaluation:   Vocational Rehabilitation: Provide vocational rehab assistance to qualifying candidates.   Vocational Rehab Evaluation & Intervention:     Vocational Rehab - 04/11/16 1415    Initial Vocational Rehab Evaluation & Intervention   Assessment shows need for Vocational Rehabilitation No      Education: Education Goals: Education classes will be provided on a weekly basis, covering required topics. Participant will state understanding/return demonstration of topics presented.  Learning Barriers/Preferences:     Learning Barriers/Preferences - 04/11/16 1411    Learning Barriers/Preferences   Learning Barriers None  no learning barriers noted   Learning Preferences Pictoral;Written Material      Education Topics: Count Your Pulse:  -Group instruction provided by verbal instruction, demonstration, patient participation and written materials to support subject.  Instructors address importance of being able to find your pulse and how to count your pulse when at home without a heart monitor.  Patients get hands on experience counting their pulse with staff help and individually.          CARDIAC REHAB PHASE II  EXERCISE from 05/01/2016 in [facility] CARDIAC REHAB   Date  04/19/16   Educator  Karlene Lineman, RN   Instruction Review Code  2- meets goals/outcomes      Heart Attack, Angina, and Risk Factor Modification:  -Group instruction provided by verbal instruction, video, and written materials to support subject.  Instructors address signs and symptoms of angina and heart attacks.  Also discuss risk factors for heart disease and how to make changes to improve heart health risk factors.   Functional Fitness:  -Group instruction provided by verbal instruction, demonstration, patient participation, and written materials to support subject.  Instructors address safety measures for doing things around the house.  Discuss how to get up and down off the floor, how to pick things up properly, how to safely get out of a chair without assistance, and balance training.   Meditation and Mindfulness:  -Group instruction provided by verbal instruction, patient participation, and written materials to support subject.  Instructor addresses importance of mindfulness and meditation practice to help reduce stress and improve awareness.  Instructor also leads participants through a meditation exercise.       CARDIAC REHAB PHASE II EXERCISE from 05/01/2016 in [facility] CARDIAC REHAB   Date  04/17/16   Instruction Review Code  2- meets goals/outcomes      Stretching for Flexibility and Mobility:  -Group instruction provided by verbal instruction, patient participation, and written materials to support subject.  Instructors lead participants through series of stretches that are designed to increase flexibility thus improving mobility.  These stretches are additional exercise for major muscle groups that are typically performed during regular warm up and cool down.   Hands Only CPR Anytime:  -Group instruction provided by verbal instruction, video, patient participation and  written materials to support subject.  Instructors co-teach with AHA video for hands only CPR.  Participants get hands on experience with mannequins.   Nutrition I class: Heart Healthy Eating:  -Group instruction provided by PowerPoint slides, verbal discussion, and written materials to support subject matter. The instructor gives an explanation and review of the Therapeutic Lifestyle Changes diet recommendations, which includes a discussion on lipid goals, dietary fat, sodium, fiber, plant stanol/sterol esters, sugar, and the components of a well-balanced, healthy diet.      CARDIAC REHAB PHASE II EXERCISE from 05/01/2016 in [facility] CARDIAC REHAB   Date  04/23/16   Educator  RD   Instruction Review Code  2- meets goals/outcomes      Nutrition II class: Lifestyle Skills:  -Group instruction provided by PowerPoint slides, verbal discussion, and written materials to support subject matter. The instructor gives an explanation and review of label reading, grocery shopping for heart health, heart healthy recipe modifications, and ways to make healthier choices when eating out.   Diabetes Question & Answer:  -Group instruction provided by PowerPoint slides, verbal discussion, and written materials to support subject matter. The instructor gives an explanation and review of diabetes co-morbidities, pre- and post-prandial blood glucose goals, pre-exercise blood glucose goals, signs, symptoms, and treatment of hypoglycemia and hyperglycemia, and foot care basics.   Diabetes Blitz:  -Group instruction provided by PowerPoint slides, verbal discussion, and written materials to support subject matter. The instructor gives an explanation and review of the physiology behind type 1 and type 2 diabetes, diabetes medications and rational behind using different medications, pre- and post-prandial blood glucose recommendations and Hemoglobin A1c goals, diabetes diet, and exercise including blood  glucose guidelines for exercising safely.    Portion Distortion:  -Group instruction provided by PowerPoint slides, verbal discussion, written materials, and food models to support subject matter. The instructor gives an explanation of serving size versus portion size, changes in portions sizes over the last 20 years, and what consists of a serving from each food group.   Stress Management:  -Group instruction provided by verbal instruction,  video, and written materials to support subject matter.  Instructors review role of stress in heart disease and how to cope with stress positively.     Exercising on Your Own:  -Group instruction provided by verbal instruction, power point, and written materials to support subject.  Instructors discuss benefits of exercise, components of exercise, frequency and intensity of exercise, and end points for exercise.  Also discuss use of nitroglycerin and activating EMS.  Review options of places to exercise outside of rehab.  Review guidelines for sex with heart disease.   Cardiac Drugs I:  -Group instruction provided by verbal instruction and written materials to support subject.  Instructor reviews cardiac drug classes: antiplatelets, anticoagulants, beta blockers, and statins.  Instructor discusses reasons, side effects, and lifestyle considerations for each drug class.   Cardiac Drugs II:  -Group instruction provided by verbal instruction and written materials to support subject.  Instructor reviews cardiac drug classes: angiotensin converting enzyme inhibitors (ACE-I), angiotensin II receptor blockers (ARBs), nitrates, and calcium channel blockers.  Instructor discusses reasons, side effects, and lifestyle considerations for each drug class.      CARDIAC REHAB PHASE II EXERCISE from 05/01/2016 in [facility] CARDIAC REHAB   Date  04/24/16   Instruction Review Code  2- meets goals/outcomes      Anatomy and Physiology of the Circulatory  System:  -Group instruction provided by verbal instruction, video, and written materials to support subject.  Reviews functional anatomy of heart, how it relates to various diagnoses, and what role the heart plays in the overall system.      CARDIAC REHAB PHASE II EXERCISE from 05/01/2016 in [facility] CARDIAC REHAB   Date  05/01/16   Instruction Review Code  2- meets goals/outcomes      Knowledge Questionnaire Score:     Knowledge Questionnaire Score - 04/11/16 1239    Knowledge Questionnaire Score   Pre Score 6/24      Core Components/Risk Factors/Patient Goals at Admission:     Personal Goals and Risk Factors at Admission - 04/11/16 0924    Core Components/Risk Factors/Patient Goals on Admission    Weight Management Yes;Weight Loss   Intervention Weight Management: Develop a combined nutrition and exercise program designed to reach desired caloric intake, while maintaining appropriate intake of nutrient and fiber, sodium and fats, and appropriate energy expenditure required for the weight goal.;Weight Management: Provide education and appropriate resources to help participant work on and attain dietary goals.;Weight Management/Obesity: Establish reasonable short term and long term weight goals.   Expected Outcomes Short Term: Continue to assess and modify interventions until short term weight is achieved;Long Term: Adherence to nutrition and physical activity/exercise program aimed toward attainment of established weight goal;Weight Maintenance: Understanding of the daily nutrition guidelines, which includes 25-35% calories from fat, 7% or less cal from saturated fats, less than 200mg  cholesterol, less than 1.5gm of sodium, & 5 or more servings of fruits and vegetables daily;Weight Loss: Understanding of general recommendations for a balanced deficit meal plan, which promotes 1-2 lb weight loss per week and includes a negative energy balance of [phone] kcal/d   Increase  Strength and Stamina Yes   Intervention Develop an individualized exercise prescription for aerobic and resistive training based on initial evaluation findings, risk stratification, comorbidities and participant's personal goals.;Provide advice, education, support and counseling about physical activity/exercise needs.   Expected Outcomes Achievement of increased cardiorespiratory fitness and enhanced flexibility, muscular endurance and strength shown through measurements of functional capacity  and personal statement of participant.   Improve shortness of breath with ADL's Yes   Intervention Provide education, individualized exercise plan and daily activity instruction to help decrease symptoms of SOB with activities of daily living.   Expected Outcomes Short Term: Achieves a reduction of symptoms when performing activities of daily living.   Lipids Yes   Intervention Provide education and support for participant on nutrition & aerobic/resistive exercise along with prescribed medications to achieve LDL 70mg , HDL >40mg .   Expected Outcomes Short Term: Participant states understanding of desired cholesterol values and is compliant with medications prescribed. Participant is following exercise prescription and nutrition guidelines.;Long Term: Cholesterol controlled with medications as prescribed, with individualized exercise RX and with personalized nutrition plan. Value goals: LDL < 70mg , HDL > 40 mg.   Stress Yes   Intervention Offer individual and/or small group education and counseling on adjustment to heart disease, stress management and health-related lifestyle change. Teach and support self-help strategies.;Refer participants experiencing significant psychosocial distress to appropriate mental health specialists for further evaluation and treatment. When possible, include family members and significant others in education/counseling sessions.   Expected Outcomes Long Term: Emotional wellbeing is  indicated by absence of clinically significant psychosocial distress or social isolation.;Short Term: Participant demonstrates changes in health-related behavior, relaxation and other stress management skills, ability to obtain effective social support, and compliance with psychotropic medications if prescribed.   Personal Goal Other Yes   Personal Goal short - more energy, reduce SOB and lose 5lbs   Intervention Provide exercise education and guidelines to assist with energy levels and weightloss   Expected Outcomes Pt will be able to lose 5lbs and have increased energy      Core Components/Risk Factors/Patient Goals Review:      Goals and Risk Factor Review      04/11/16 1418           Core Components/Risk Factors/Patient Goals Review   Personal Goals Review Weight Management/Obesity;Lipids          Core Components/Risk Factors/Patient Goals at Discharge (Final Review):      Goals and Risk Factor Review - 04/11/16 1418    Core Components/Risk Factors/Patient Goals Review   Personal Goals Review Weight Management/Obesity;Lipids      ITP Comments:     ITP Comments      04/11/16 0922           ITP Comments Dr. Armanda Magic, Medical Director          Comments:  Pt is making expected progress toward personal goals after completing  8 sessions.Pt continues to exhibit positive coping skills with no psychosocial needs with no further intervention warranted. Pt has good support system in place. Recommend continued exercise and life style modification education including  stress management and relaxation techniques to decrease cardiac risk profile. Alanson Aly, BSN

## 2016-05-02 NOTE — Progress Notes (Signed)
ELECTROPHYSIOLOGY CONSULT NOTE  Patient ID: Travis PardonDavid W Doan, MRN: [id number], DOB/AGE: 12-04-1950 65 y.o. Admit date: (Not on file) Date of Consult: 05/02/2016  Primary Physician: Enrique SackGREEN, EDWIN JAY, MD Primary Cardiologist:[facility]  Chief Complaint: PVC and dizzines   HPI Travis Jacobs is a 65 y.o. male  Referred for PVCs. He been noted to have an irregular heartbeat. ECGs have demonstrated no ectopy. He was submitted for a Holter monitor which demonstrated 16%  morphology suggests inferior axis likely originating from the RVOT.\  He has no palpitations. His major complaint over the years has been dyspnea which has been somewhat episodic. He was referred for a stress test which was abnormal prompting catheterization..\ LHC>>Prox RCA lesion, 30% stenosed. Mid RCA to Dist RCA lesion, 40% stenosed. Prox LAD to Mid LAD lesion, 30% stenosed. 1st Mrg lesion, 99% stenosed. Post intervention, there is a 0% residual stenosis. The left ventricular systolic function is normal.  1. Severe (99% ) one-vessel coronary artery disease involving proximal OM1 which is very large and supplies most of the left circumflex distribution. 2. Normal LV systolic function and mildly elevated left ventricular end-diastolic pressure. 3. Successful angioplasty and drug-eluting stent placement to OM 1.  Since then his breathing has been much better. He feels better than he has in years.   Past Medical History  Diagnosis Date  . GERD (gastroesophageal reflux disease)   . Esophageal stricture   . Hemorrhoids   . Hiatal hernia   . Diverticulosis   . IBS (irritable bowel syndrome)   . Hyperlipidemia   . Cataract     bil cateracts  . Multiple food allergies     "lots of food allergies; beef, tuna, flounder, potatoes, garlic, tomatoes, apples, bananas, sesame seeds, shellfish  . CAD (coronary artery disease)     a. DES to OM1 02/2016.   Marland Kitchen. PVC's (premature ventricular  contractions)   . Pre-diabetes       Surgical History:  Past Surgical History  Procedure Laterality Date  . Surgery scrotal / testicular Left ~ 1960    "hernia"  . Colonoscopy    . Upper gastrointestinal endoscopy    . Tonsillectomy    . Esophagogastroduodenoscopy (egd) with esophageal dilation    . Cystoscopy with stent placement    . Hernia repair    . Hemorrhoid banding    . Cardiac catheterization N/A 02/28/2016    Procedure: Left Heart Cath and Coronary Angiography;  Surgeon: Iran OuchMuhammad A Arida, MD;  Location: [facility];  Service: Cardiovascular;  Laterality: N/A;  . Cardiac catheterization N/A 02/28/2016    Procedure: Coronary Stent Intervention;  Surgeon: Iran OuchMuhammad A Arida, MD;  Location: [facility];  Service: Cardiovascular;  Laterality: N/A;     Home Meds: Prior to Admission medications   Medication Sig Start Date End Date Taking? Authorizing Provider  aspirin EC 81 MG EC tablet Take 1 tablet (81 mg total) by mouth daily. 02/29/16  Yes Brittainy Sherlynn CarbonM Simmons, PA-C  atorvastatin (LIPITOR) 40 MG tablet Take 1 tablet (40 mg total) by mouth daily. 02/29/16  Yes Brittainy Sherlynn CarbonM Simmons, PA-C  CIALIS 5 MG tablet Take 5 mg by mouth daily as needed for erectile dysfunction.  02/27/15  Yes Historical Provider, MD  clopidogrel (PLAVIX) 75 MG tablet Take 1 tablet (75 mg total) by mouth daily with breakfast. 02/29/16  Yes Brittainy Sherlynn CarbonM Simmons, PA-C  docusate sodium (COLACE) 100 MG capsule Take 500 mg by mouth daily.  Yes Historical Provider, MD  halobetasol (ULTRAVATE) 0.05 % cream Apply 1 application topically daily.    Yes Historical Provider, MD  magnesium oxide (MAG-OX) 400 MG tablet Take 400 mg by mouth daily.   Yes Historical Provider, MD  metoprolol tartrate (LOPRESSOR) 25 MG tablet Take 1 tablet (25 mg total) by mouth 2 (two) times daily. 02/29/16  Yes Brittainy Sherlynn Carbon, PA-C  nitroGLYCERIN (NITROSTAT) 0.4 MG SL tablet Place 1 tablet (0.4 mg total) under the tongue every 5  (five) minutes x 3 doses as needed for chest pain. 02/29/16  Yes Brittainy Sherlynn Carbon, PA-C  pantoprazole (PROTONIX) 40 MG tablet Take 1 tablet (40 mg total) by mouth daily. 02/29/16  Yes Brittainy Sherlynn Carbon, PA-C  Probiotic Product (PROBIOTIC DAILY PO) Take 1 capsule by mouth daily.    Yes Historical Provider, MD  triamcinolone (NASACORT ALLERGY 24HR) 55 MCG/ACT AERO nasal inhaler Place 2 sprays into the nose daily as needed (allergies).    Yes Historical Provider, MD    Allergies:  Allergies  Allergen Reactions  . Aleve [Naproxen Sodium]     Doesn't remember  . Apple Swelling  . Banana Nausea And Vomiting  . Beef-Derived Products Nausea And Vomiting  . Erythromycin Swelling    Mouth swells  . Fish Allergy Swelling  . Garlic Swelling  . Sesame Seed (Diagnostic) Swelling  . Shellfish Allergy Other (See Comments)    Not sure of reaction found out about allergy through [facility]  . Strawberry (Diagnostic)   . Sulfonamide Derivatives Swelling    Mouth swelling  . Tomato Nausea And Vomiting  . Iodine Swelling    "I blow up like a balloon"    Social History   Social History  . Marital Status: Married    Spouse Name: N/A  . Number of Children: N/A  . Years of Education: N/A   Occupational History  . Not on file.   Social History Main Topics  . Smoking status: Former Smoker -- 1.00 packs/day for 20 years    Types: Cigarettes  . Smokeless tobacco: Never Used     Comment: "quit smoking cigarettes in the 1980s"  . Alcohol Use: 0.0 oz/week    0 Standard drinks or equivalent per week     Comment: 02/27/2016 "nothing in the last few weeks; no regular pattern"  . Drug Use: No  . Sexual Activity: Yes   Other Topics Concern  . Not on file   Social History Narrative     Family History  Problem Relation Age of Onset  . Heart disease Mother   . Heart disease Father   . Colon cancer Neg Hx   . Esophageal cancer Neg Hx   . Rectal cancer Neg Hx   . Stomach cancer Neg Hx       ROS:  Please see the history of present illness.     All other systems reviewed and negative.    Physical Exam   Blood pressure 122/78, pulse 63, height  (1.778 m), weight 175 lb 6.4 oz (79.561 kg), SpO2 98 %. General: Well developed, well nourished male in no acute distress. Head: Normocephalic, atraumatic, sclera non-icteric, no xanthomas, nares are without discharge. EENT: normal  Lymph Nodes:  none Neck: Negative for carotid bruits. JVD not elevated. Back:without scoliosis kyphosis  Lungs: Clear bilaterally to auscultation without wheezes, rales, or rhonchi. Breathing is unlabored. Heart: RRR with S1 S2. No * * murmur . No rubs, or gallops appreciated. Abdomen: Soft, non-tender, non-distended with normoactive  bowel sounds. No hepatomegaly. No rebound/guarding. No obvious abdominal masses. Msk:  Strength and tone appear normal for age. Extremities: No clubbing or cyanosis. N  edema.  Distal pedal pulses are 2+ and equal bilaterally. Skin: Warm and Dry Neuro: Alert and oriented X 3. CN III-XII intact Grossly normal sensory and motor function . Psych:  Responds to questions appropriately with a normal affect.      Labs: Cardiac Enzymes No results for input(s): CKTOTAL, CKMB, TROPONINI in the last 72 hours. CBC Lab Results  Component Value Date   WBC 16.0* 02/29/2016   HGB 14.7 02/29/2016   HCT 43.7 02/29/2016   MCV 86.7 02/29/2016   PLT 271 02/29/2016   PROTIME: No results for input(s): LABPROT, INR in the last 72 hours. Chemistry No results for input(s): NA, K, CL, CO2, BUN, CREATININE, CALCIUM, PROT, BILITOT, ALKPHOS, ALT, AST, GLUCOSE in the last 168 hours.  Invalid input(s): LABALBU Lipids Lab Results  Component Value Date   CHOL 124* 04/24/2016   HDL 36* 04/24/2016   LDLCALC 65 04/24/2016   TRIG 113 04/24/2016   BNP No results found for: PROBNP Thyroid Function Tests: No results for input(s): TSH, T4TOTAL, T3FREE, THYROIDAB in the last 72  hours.  Invalid input(s): FREET3 Miscellaneous No results found for: DDIMER  Radiology/Studies:  No results found.  EKG:  Sinus rhythm at 63 Intervals 15/08/39 Otherwise normal  Assessment and Plan:  PVCs inferior axis morphology  Vertigo  Coronary artery disease with recent stenting  Dyspnea   The patient has no treatable symptoms to his PVCs. His LV function was normal catheterization. Hence, there is no indication for doing anything further with his ventricular ectopy.  I would recheck his echocardiogram in about 6 months to make sure that there is no interval deterioration in LV function as we don't really have a good sense of when it was that his PVCs were first issue.   I will arrange follow-up with Dr. Colon Branch. We will see him again as necessary  The dizziness is most consistent with benign positional vertigo. I suggested he get up with Dr. Chilton Si to go over the vertigo maneuvers.       Sherryl Manges

## 2016-05-03 ENCOUNTER — Encounter (HOSPITAL_COMMUNITY)
Admission: RE | Admit: 2016-05-03 | Discharge: 2016-05-03 | Disposition: A | Discharge status: Home / Self Care | Payer: Medicare Other | Source: Ambulatory Visit | Attending: Cardiovascular Disease | Admitting: Cardiovascular Disease

## 2016-05-03 DIAGNOSIS — Z955 Presence of coronary angioplasty implant and graft: Secondary | ICD-10-CM

## 2016-05-03 NOTE — Progress Notes (Signed)
Reviewed home exercise with pt today.  Pt plans to walk for exercise.  Reviewed THR, pulse, RPE, sign and symptoms, NTG use, and when to call 911 or MD.  Also discussed weather considerations and indoor options.  Pt voiced understanding.    Mosie Angus,MS,ACSM RCEP 

## 2016-05-06 ENCOUNTER — Encounter (HOSPITAL_COMMUNITY)
Admission: RE | Admit: 2016-05-06 | Discharge: 2016-05-06 | Disposition: A | Discharge status: Home / Self Care | Payer: Medicare Other | Source: Ambulatory Visit | Attending: Cardiovascular Disease | Admitting: Cardiovascular Disease

## 2016-05-06 DIAGNOSIS — Z955 Presence of coronary angioplasty implant and graft: Secondary | ICD-10-CM

## 2016-05-08 ENCOUNTER — Encounter (HOSPITAL_COMMUNITY)
Admission: RE | Admit: 2016-05-08 | Discharge: 2016-05-08 | Disposition: A | Discharge status: Home / Self Care | Payer: Medicare Other | Source: Ambulatory Visit | Attending: Cardiovascular Disease | Admitting: Cardiovascular Disease

## 2016-05-08 DIAGNOSIS — Z955 Presence of coronary angioplasty implant and graft: Secondary | ICD-10-CM | POA: Diagnosis not present

## 2016-05-10 ENCOUNTER — Encounter (HOSPITAL_COMMUNITY)
Admission: RE | Admit: 2016-05-10 | Discharge: 2016-05-10 | Disposition: A | Discharge status: Home / Self Care | Payer: Medicare Other | Source: Ambulatory Visit | Attending: Cardiovascular Disease | Admitting: Cardiovascular Disease

## 2016-05-10 DIAGNOSIS — Z955 Presence of coronary angioplasty implant and graft: Secondary | ICD-10-CM | POA: Diagnosis not present

## 2016-05-10 NOTE — Progress Notes (Signed)
Travis Jacobs 65 y.o. male Nutrition Note Spoke with pt. Nutrition Plan and Nutrition Survey goals reviewed with pt. Pt is following Step 2 of the Therapeutic Lifestyle Changes diet. Pt wants to lose wt to a goal wt of 160-165 lb. Pt has lost 15 lb from his pre-hospitalization wt of 189 lb. Wt loss tips reviewed. Pt is pre-diabetic. Pre-diabetes discussed. Pt c/o constipation and "greasy looking stools." Pt taking Align or a yogurt daily. Drinking plenty of fluids discussed. Pt expressed understanding of the information reviewed. Pt has attended nutrition education classes offered.  Lab Results  Component Value Date   HGBA1C 6.1 (H) 02/27/2016   Wt Readings from Last 3 Encounters:  05/02/16 175 lb 6.4 oz (79.6 kg)  04/11/16 174 lb 13.2 oz (79.3 kg)  03/13/16 180 lb 12.8 oz (82 kg)    Nutrition Diagnosis ? Food-and nutrition-related knowledge deficit related to lack of exposure to information as related to diagnosis of: ? CVD ? Pre-DM  Nutrition Intervention ? Pt's individual nutrition plan reviewed with pt. ? Benefits of adopting Therapeutic Lifestyle Changes discussed when Medficts reviewed. ? Pt to attend the Portion Distortion class ? Pt given handout for: ? pre-diabetes ? Pt encouraged to take Align daily ? ? If pt may benefit from a trial of pancreatic enzymes due to greasy stools. Pt to discuss with his MD. ? Continue client-centered nutrition education by RD, as part of interdisciplinary care. Goal(s) ? Pt to identify food quantities necessary to achieve weight loss to a goal wt of 160-165 lb at graduation from cardiac rehab.  ? Pt to describe the benefit of including fruits, vegetables, whole grains, and low-fat dairy products in a heart healthy meal plan. Monitor and Evaluate progress toward nutrition goal with team. Mickle Plumb, M.Ed, RD, LDN, CDE 05/10/2016 8:05 AM

## 2016-05-13 ENCOUNTER — Encounter (HOSPITAL_COMMUNITY)
Admission: RE | Admit: 2016-05-13 | Discharge: 2016-05-13 | Disposition: A | Discharge status: Home / Self Care | Payer: Medicare Other | Source: Ambulatory Visit | Attending: Cardiovascular Disease | Admitting: Cardiovascular Disease

## 2016-05-13 DIAGNOSIS — Z955 Presence of coronary angioplasty implant and graft: Secondary | ICD-10-CM | POA: Diagnosis not present

## 2016-05-15 ENCOUNTER — Encounter (HOSPITAL_COMMUNITY)
Admission: RE | Admit: 2016-05-15 | Discharge: 2016-05-15 | Disposition: A | Discharge status: Home / Self Care | Payer: Medicare Other | Source: Ambulatory Visit | Attending: Cardiovascular Disease | Admitting: Cardiovascular Disease

## 2016-05-15 ENCOUNTER — Encounter: Payer: Self-pay | Admitting: Physician Assistant

## 2016-05-15 DIAGNOSIS — Z955 Presence of coronary angioplasty implant and graft: Secondary | ICD-10-CM | POA: Insufficient documentation

## 2016-05-17 ENCOUNTER — Encounter (HOSPITAL_COMMUNITY)
Admission: RE | Admit: 2016-05-17 | Discharge: 2016-05-17 | Disposition: A | Discharge status: Home / Self Care | Payer: Medicare Other | Source: Ambulatory Visit | Attending: Cardiovascular Disease | Admitting: Cardiovascular Disease

## 2016-05-17 DIAGNOSIS — Z955 Presence of coronary angioplasty implant and graft: Secondary | ICD-10-CM | POA: Diagnosis not present

## 2016-05-20 ENCOUNTER — Encounter (HOSPITAL_COMMUNITY)
Admission: RE | Admit: 2016-05-20 | Discharge: 2016-05-20 | Disposition: A | Discharge status: Home / Self Care | Payer: Medicare Other | Source: Ambulatory Visit | Attending: Cardiovascular Disease | Admitting: Cardiovascular Disease

## 2016-05-20 DIAGNOSIS — Z955 Presence of coronary angioplasty implant and graft: Secondary | ICD-10-CM | POA: Diagnosis not present

## 2016-05-22 ENCOUNTER — Encounter (HOSPITAL_COMMUNITY)
Admission: RE | Admit: 2016-05-22 | Discharge: 2016-05-22 | Disposition: A | Discharge status: Home / Self Care | Payer: Medicare Other | Source: Ambulatory Visit | Attending: Cardiovascular Disease | Admitting: Cardiovascular Disease

## 2016-05-22 DIAGNOSIS — Z955 Presence of coronary angioplasty implant and graft: Secondary | ICD-10-CM | POA: Diagnosis not present

## 2016-05-24 ENCOUNTER — Telehealth (HOSPITAL_COMMUNITY): Payer: Self-pay | Admitting: *Deleted

## 2016-05-24 ENCOUNTER — Encounter (HOSPITAL_COMMUNITY)
Admission: RE | Admit: 2016-05-24 | Discharge: 2016-05-24 | Disposition: A | Discharge status: Home / Self Care | Payer: Medicare Other | Source: Ambulatory Visit | Attending: Cardiovascular Disease | Admitting: Cardiovascular Disease

## 2016-05-24 DIAGNOSIS — Z955 Presence of coronary angioplasty implant and graft: Secondary | ICD-10-CM | POA: Diagnosis not present

## 2016-05-24 NOTE — Progress Notes (Signed)
Pt with increased Couplets primarily on the stepper.  Pt reports no symptoms but does notice that he is slightly short of breath, 02 sat 97.  during the remainder of exercise pt with pvc and rare couplets.  Pt verbalizes compliance to medications and no caffeine intake.  Will fax rehab strips to Dr Clifton Jamesmcalhany to view. Alanson Alyarlette Michol Emory RN, BSN

## 2016-05-24 NOTE — Telephone Encounter (Signed)
-----   Message from Kathleene Hazelhristopher D McAlhany, MD sent at 05/24/2016  1:55 PM EDT ----- Regarding: RE: Strips faxed over for review Thanks Davy Faught,   He has been seen by Dr. Graciela HusbandsKlein in EP for his PVCs but no plans to change his medications.   Thayer Ohmhris  ----- Message ----- From: Chelsea Ausarlette B Anayah Arvanitis, RN Sent: 05/24/2016   8:58 AM To: Kathleene Hazelhristopher D McAlhany, MD Subject: Strips faxed over for review                   Dr. Clifton JamesMcalhany,  I faxed to your office strips for you to review.  Pt with frequent couplets primarily while on the stepper otherwise his usual frequent pvc (which is present on his 12 lead). Pt reports that he is compliant  with his medications and no caffeine intake.  He is asymptomatic with no awareness however he says at times he feels short of breath but is unsure of any correlation since he doesn't feel anything. Pt took his metoprolol 25mg  and he is on mag ox.  Please advise Karlene Linemanarlette Mistina Coatney RN

## 2016-05-27 ENCOUNTER — Encounter (HOSPITAL_COMMUNITY)
Admission: RE | Admit: 2016-05-27 | Discharge: 2016-05-27 | Disposition: A | Discharge status: Home / Self Care | Payer: Medicare Other | Source: Ambulatory Visit | Attending: Cardiovascular Disease | Admitting: Cardiovascular Disease

## 2016-05-27 DIAGNOSIS — Z955 Presence of coronary angioplasty implant and graft: Secondary | ICD-10-CM

## 2016-05-29 ENCOUNTER — Encounter (HOSPITAL_COMMUNITY)
Admission: RE | Admit: 2016-05-29 | Discharge: 2016-05-29 | Disposition: A | Discharge status: Home / Self Care | Payer: Medicare Other | Source: Ambulatory Visit | Attending: Cardiovascular Disease | Admitting: Cardiovascular Disease

## 2016-05-29 DIAGNOSIS — Z955 Presence of coronary angioplasty implant and graft: Secondary | ICD-10-CM | POA: Diagnosis not present

## 2016-05-30 ENCOUNTER — Encounter: Payer: Self-pay | Admitting: Cardiovascular Disease

## 2016-05-30 NOTE — Progress Notes (Signed)
Cardiac Individual Treatment Plan  Patient Details  Name: Travis Jacobs MRN: [id number] Date of Birth: 1951/07/02 Referring Provider:   Flowsheet Row CARDIAC REHAB PHASE II ORIENTATION from 04/11/2016 in [facility]  Referring Provider  Earney HamburgMcAlhany, Chris MD      Initial Encounter Date:  Flowsheet Row CARDIAC REHAB PHASE II ORIENTATION [facility]11/2016 [facility]  Date  04/11/16  Referring Provider  Earney HamburgMcAlhany, Chris MD      Visit Diagnosis: Stented coronary artery  Patient's Home Medications on Admission:  Current Outpatient Prescriptions:  .  aspirin EC 81 MG EC tablet, Take 1 tablet (81 mg total) by mouth daily., Disp: , Rfl:  .  atorvastatin (LIPITOR) 40 MG tablet, Take 1 tablet (40 mg total) by mouth daily., Disp: 30 tablet, Rfl: 5 .  CIALIS 5 MG tablet, Take 5 mg by mouth daily as needed for erectile dysfunction. , Disp: , Rfl: 98 .  clopidogrel (PLAVIX) 75 MG tablet, Take 1 tablet (75 mg total) by mouth daily with breakfast., Disp: 30 tablet, Rfl: 11 .  docusate sodium (COLACE) 100 MG capsule, Take 500 mg by mouth daily., Disp: , Rfl:  .  halobetasol (ULTRAVATE) 0.05 % cream, Apply 1 application topically daily. , Disp: , Rfl:  .  magnesium oxide (MAG-OX) 400 MG tablet, Take 400 mg by mouth daily., Disp: , Rfl:  .  metoprolol tartrate (LOPRESSOR) 25 MG tablet, Take 1 tablet (25 mg total) by mouth 2 (two) times daily., Disp: 60 tablet, Rfl: 5 .  nitroGLYCERIN (NITROSTAT) 0.4 MG SL tablet, Place 1 tablet (0.4 mg total) under the tongue every 5 (five) minutes x 3 doses as needed for chest pain., Disp: 25 tablet, Rfl: 2 .  pantoprazole (PROTONIX) 40 MG tablet, Take 1 tablet (40 mg total) by mouth daily., Disp: 30 tablet, Rfl: 5 .  Probiotic Product (PROBIOTIC DAILY PO), Take 1 capsule by mouth daily. , Disp: , Rfl:  .  triamcinolone (NASACORT ALLERGY 24HR) 55 MCG/ACT AERO nasal inhaler, Place 2 sprays into the nose  daily as needed (allergies). , Disp: , Rfl:   Past Medical History: Past Medical History:  Diagnosis Date  . CAD (coronary artery disease)    a. DES to OM1 02/2016.   . Cataract    bil cateracts  . Diverticulosis   . Esophageal stricture   . GERD (gastroesophageal reflux disease)   . Hemorrhoids   . Hiatal hernia   . Hyperlipidemia   . IBS (irritable bowel syndrome)   . Multiple food allergies    "lots of food allergies; beef, tuna, flounder, potatoes, garlic, tomatoes, apples, bananas, sesame seeds, shellfish  . Pre-diabetes   . PVC's (premature ventricular contractions)     Tobacco Use: History  Smoking Status  . Former Smoker  . Packs/day: 1.00  . Years: 20.00  . Types: Cigarettes  Smokeless Tobacco  . Never Used    Comment: "quit smoking cigarettes in the 1980s"    Labs: Recent Review Flowsheet Data    Labs for ITP Cardiac and Pulmonary Rehab Latest Ref Rng & Units 02/27/2016 02/28/2016 04/24/2016   Cholestrol 125 - 200 mg/dL - 782177 956(O124(L)   LDLCALC <130 mg/dL - 130(Q110(H) 65   HDL >=65>=40 mg/dL - 78(I39(L) 69(G36(L)   Trlycerides <150 mg/dL - 295138 284113   Hemoglobin A1c 4.8 - 5.6 % 6.1(H) - -      Capillary Blood Glucose: No results found for: GLUCAP   Exercise Target  Goals:    Exercise Program Goal: Individual exercise prescription set with THRR, safety & activity barriers. Participant demonstrates ability to understand and report RPE using BORG scale, to self-measure pulse accurately, and to acknowledge the importance of the exercise prescription.  Exercise Prescription Goal: Starting with aerobic activity 30 plus minutes a day, 3 days per week for initial exercise prescription. Provide home exercise prescription and guidelines that participant acknowledges understanding prior to discharge.  Activity Barriers & Risk Stratification:     Activity Barriers & Cardiac Risk Stratification - 04/11/16 1520      Activity Barriers & Cardiac Risk Stratification   Cardiac Risk  Stratification Moderate      6 Minute Walk:     6 Minute Walk    Row Name 04/11/16 1240         6 Minute Walk   Phase Initial     Distance 1852 feet     Walk Time 6 minutes     # of Rest Breaks 0     MPH 3.5     METS 4.3     RPE 13     Perceived Dyspnea  2     VO2 Peak 14.9     Symptoms Yes (comment)     Comments mild to mod SOB     Resting HR 55 bpm     Resting BP 114/70     Max Ex. HR 88 bpm     Max Ex. BP 134/80     2 Minute Post BP 108/70        Initial Exercise Prescription:     Initial Exercise Prescription - 04/11/16 1200      Date of Initial Exercise RX and Referring Provider   Date 04/11/16   Referring Provider Earney Hamburg MD     Treadmill   MPH 2.8   Grade 1   Minutes 10   METs 3.53     Bike   Level 0.8   Minutes 10   METs 3     NuStep   Level 3   Minutes 10   METs 3     Prescription Details   Frequency (times per week) 3   Duration Progress to 30 minutes of continuous aerobic without signs/symptoms of physical distress     Intensity   THRR 40-80% of Max Heartrate 62-123   Ratings of Perceived Exertion 11-13   Perceived Dyspnea 0-4     Progression   Progression Continue to progress workloads to maintain intensity without signs/symptoms of physical distress.     Resistance Training   Training Prescription Yes   Weight 3lbs   Reps 10-12      Perform Capillary Blood Glucose checks as needed.  Exercise Prescription Changes:     Exercise Prescription Changes    Row Name 04/22/16 1700 05/01/16 1600 05/08/16 1000 05/24/16 1000       Exercise Review   Progression Yes Yes Yes Yes      Response to Exercise   Blood Pressure (Admit) 108/62 116/70 108/72 98/60    Blood Pressure (Exercise) 128/70 120/62 162/82  rchk BP 138/80 140/70  rchk BP 138/80    Blood Pressure (Exit) 104/68 100/60 104/60 110/60    Heart Rate (Admit) 64 bpm 65 bpm 63 bpm 59 bpm    Heart Rate (Exercise) 91 bpm 93 bpm 91 bpm 89 bpm    Heart Rate (Exit)  64 bpm 64 bpm 64 bpm 66 bpm    Rating of Perceived Exertion (  Exercise) 13 14 14 13     Duration Progress to 30 minutes of continuous aerobic without signs/symptoms of physical distress Progress to 30 minutes of continuous aerobic without signs/symptoms of physical distress Progress to 30 minutes of continuous aerobic without signs/symptoms of physical distress Progress to 30 minutes of continuous aerobic without signs/symptoms of physical distress    Intensity THRR unchanged THRR unchanged THRR unchanged THRR unchanged      Progression   Progression Continue to progress workloads to maintain intensity without signs/symptoms of physical distress. Continue to progress workloads to maintain intensity without signs/symptoms of physical distress. Continue to progress workloads to maintain intensity without signs/symptoms of physical distress. Continue to progress workloads to maintain intensity without signs/symptoms of physical distress.    Average METs 3.1 3.7 4.4 4.3      Resistance Training   Training Prescription Yes Yes Yes Yes    Weight 4lbs 4lbs 4lbs 4lbs    Reps 10-12 10-12 10-12 10-12      Treadmill   MPH 2.8 3 3 3     Grade 1 2 2 3     Minutes 19 10 10 10     METs 3.53  -  - 4.12      Recumbant Bike   Level 3 3.5 3.5 3.5    Watts  - 50 40 44    Minutes 10 10 10 10     METs 2.3 2.3 2.1 3.9      NuStep   Level 3 3 4 4     Minutes 10 10 10 10     METs 3.1 4.1 4.6 4.7      Home Exercise Plan   Plans to continue exercise at  - Home  HEP was reviewed on 04/29/16 see progress note Home  HEP was reviewed on 04/29/16 see progress note Home  HEP was reviewed on 04/29/16 see progress note    Frequency  - Add 2 additional days to program exercise sessions. Add 2 additional days to program exercise sessions. Add 2 additional days to program exercise sessions.       Exercise Comments:     Exercise Comments    Row Name 05/24/16 1033           Exercise Comments Reviewed METs and goals.  Pt is tolerating exercise well; will continue to monitor exercise progression          Discharge Exercise Prescription (Final Exercise Prescription Changes):     Exercise Prescription Changes - 05/24/16 1000      Exercise Review   Progression Yes     Response to Exercise   Blood Pressure (Admit) 98/60   Blood Pressure (Exercise) 140/70  rchk BP 138/80   Blood Pressure (Exit) 110/60   Heart Rate (Admit) 59 bpm   Heart Rate (Exercise) 89 bpm   Heart Rate (Exit) 66 bpm   Rating of Perceived Exertion (Exercise) 13   Duration Progress to 30 minutes of continuous aerobic without signs/symptoms of physical distress   Intensity THRR unchanged     Progression   Progression Continue to progress workloads to maintain intensity without signs/symptoms of physical distress.   Average METs 4.3     Resistance Training   Training Prescription Yes   Weight 4lbs   Reps 10-12     Treadmill   MPH 3   Grade 3   Minutes 10   METs 4.12     Recumbant Bike   Level 3.5   Watts 44   Minutes 10   METs  3.9     NuStep   Level 4   Minutes 10   METs 4.7     Home Exercise Plan   Plans to continue exercise at Home  HEP was reviewed on 04/29/16 see progress note   Frequency Add 2 additional days to program exercise sessions.      Nutrition:  Target Goals: Understanding of nutrition guidelines, daily intake of sodium 1500mg , cholesterol 200mg , calories 30% from fat and 7% or less from saturated fats, daily to have 5 or more servings of fruits and vegetables.  Biometrics:     Pre Biometrics - 04/11/16 1249      Pre Biometrics   Waist Circumference 38.5 inches   Hip Circumference 40.5 inches   Waist to Hip Ratio 0.95 %   Triceps Skinfold 20 mm   % Body Fat 26.5 %   Grip Strength 46.5 kg   Flexibility 9 in   Single Leg Stand 16.09 seconds       Nutrition Therapy Plan and Nutrition Goals:     Nutrition Therapy & Goals - 05/10/16 0818      Nutrition Therapy   Diet  Therapeutic Lifestyle Changes     Personal Nutrition Goals   Personal Goal #1 1-2 lb wt loss per week to pt's goal wt of 169 lb (5 lb decrease from admission wt)   Personal Goal #2 Knowledge re: basic diabetic diet principles     Intervention Plan   Intervention Prescribe, educate and counsel regarding individualized specific dietary modifications aiming towards targeted core components such as weight, hypertension, lipid management, diabetes, heart failure and other comorbidities.;Nutrition handout(s) given to patient.  Pre-diabetes handout given   Expected Outcomes Short Term Goal: Understand basic principles of dietary content, such as calories, fat, sodium, cholesterol and nutrients.;Long Term Goal: Adherence to prescribed nutrition plan.      Nutrition Discharge: Nutrition Scores:     Nutrition Assessments - 04/22/16 1519      MEDFICTS Scores   Pre Score 12      Nutrition Goals Re-Evaluation:   Psychosocial: Target Goals: Acknowledge presence or absence of depression, maximize coping skills, provide positive support system. Participant is able to verbalize types and ability to use techniques and skills needed for reducing stress and depression.  Initial Review & Psychosocial Screening:     Initial Psych Review & Screening - 04/11/16 1416      Family Dynamics   Good Support System? Yes     Barriers   Psychosocial barriers to participate in program There are no identifiable barriers or psychosocial needs.     Screening Interventions   Interventions Encouraged to exercise      Quality of Life Scores:     Quality of Life - 04/11/16 1250      Quality of Life Scores   Health/Function Pre 26.77 %   Socioeconomic Pre 30 %   Psych/Spiritual Pre 30 %   Family Pre 30 %   GLOBAL Pre 28.69 %      PHQ-9: Recent Review Flowsheet Data    Depression screen PHQ 2/9 04/17/2016   Decreased Interest 0   Down, Depressed, Hopeless 0   PHQ - 2 Score 0      Psychosocial  Evaluation and Intervention:   Psychosocial Re-Evaluation:   Vocational Rehabilitation: Provide vocational rehab assistance to qualifying candidates.   Vocational Rehab Evaluation & Intervention:     Vocational Rehab - 04/11/16 1415      Initial Vocational Rehab Evaluation & Intervention  Assessment shows need for Vocational Rehabilitation No      Education: Education Goals: Education classes will be provided on a weekly basis, covering required topics. Participant will state understanding/return demonstration of topics presented.  Learning Barriers/Preferences:     Learning Barriers/Preferences - 04/11/16 1411      Learning Barriers/Preferences   Learning Barriers None  no learning barriers noted   Learning Preferences Pictoral;Written Material      Education Topics: Count Your Pulse:  -Group instruction provided by verbal instruction, demonstration, patient participation and written materials to support subject.  Instructors address importance of being able to find your pulse and how to count your pulse when at home without a heart monitor.  Patients get hands on experience counting their pulse with staff help and individually. Flowsheet Row CARDIAC REHAB PHASE II EXERCISE from 05/29/2016 in [facility] CARDIAC REHAB  Date  04/19/16  Educator  Karlene Lineman, RN  Instruction Review Code  2- meets goals/outcomes      Heart Attack, Angina, and Risk Factor Modification:  -Group instruction provided by verbal instruction, video, and written materials to support subject.  Instructors address signs and symptoms of angina and heart attacks.    Also discuss risk factors for heart disease and how to make changes to improve heart health risk factors.   Functional Fitness:  -Group instruction provided by verbal instruction, demonstration, patient participation, and written materials to support subject.  Instructors address safety measures for doing things  around the house.  Discuss how to get up and down off the floor, how to pick things up properly, how to safely get out of a chair without assistance, and balance training. Flowsheet Row CARDIAC REHAB PHASE II EXERCISE from 05/29/2016 in [facility] CARDIAC REHAB  Date  05/03/16  Instruction Review Code  2- meets goals/outcomes      Meditation and Mindfulness:  -Group instruction provided by verbal instruction, patient participation, and written materials to support subject.  Instructor addresses importance of mindfulness and meditation practice to help reduce stress and improve awareness.  Instructor also leads participants through a meditation exercise.  Flowsheet Row CARDIAC REHAB PHASE II EXERCISE from 05/29/2016 in [facility] CARDIAC REHAB  Date  04/17/16  Instruction Review Code  2- meets goals/outcomes      Stretching for Flexibility and Mobility:  -Group instruction provided by verbal instruction, patient participation, and written materials to support subject.  Instructors lead participants through series of stretches that are designed to increase flexibility thus improving mobility.  These stretches are additional exercise for major muscle groups that are typically performed during regular warm up and cool down. Flowsheet Row CARDIAC REHAB PHASE II EXERCISE from 05/29/2016 in [facility] CARDIAC REHAB  Date  05/10/16  Instruction Review Code  2- meets goals/outcomes      Hands Only CPR Anytime:  -Group instruction provided by verbal instruction, video, patient participation and written materials to support subject.  Instructors co-teach with [facility] video for hands only CPR.  Participants get hands on experience with mannequins.   Nutrition I class: Heart Healthy Eating:  -Group instruction provided by PowerPoint slides, verbal discussion, and written materials to support subject matter. The instructor gives an explanation and review  of the Therapeutic Lifestyle Changes diet recommendations, which includes a discussion on lipid goals, dietary fat, sodium, fiber, plant stanol/sterol esters, sugar, and the components of a well-balanced, healthy diet. Flowsheet Row CARDIAC REHAB PHASE II EXERCISE [facility] 05/29/2016 [facility]  Date  04/23/16  Educator  RD  Instruction Review Code  2- meets goals/outcomes      Nutrition II class: Lifestyle Skills:  -Group instruction provided by PowerPoint slides, verbal discussion, and written materials to support subject matter. The instructor gives an explanation and review of label reading, grocery shopping for heart health, heart healthy recipe modifications, and ways to make healthier choices when eating out. Flowsheet Row CARDIAC REHAB PHASE [facility]  Date  05/07/16  Educator  RD  Instruction Review Code  2- meets goals/outcomes      Diabetes Question & Answer:  -Group instruction provided by PowerPoint slides, verbal discussion, and written materials to support subject matter. The instructor gives an explanation and review of diabetes co-morbidities, pre- and post-prandial blood glucose goals, pre-exercise blood glucose goals, signs, symptoms, and treatment of hypoglycemia and hyperglycemia, and foot care basics. Flowsheet Row CARDIAC REHAB PHASE [facility]  Date  05/24/16  Educator  RD  Instruction Review Code  2- meets goals/outcomes      Diabetes Blitz:  -Group instruction provided by PowerPoint slides, verbal discussion, and written materials to support subject matter. The instructor gives an explanation and review of the physiology behind type 1 and type 2 diabetes, diabetes medications and rational behind using different medications, pre- and post-prandial blood glucose recommendations and Hemoglobin A1c goals, diabetes  diet, and exercise including blood glucose guidelines for exercising safely.    Portion Distortion:  -Group instruction provided by PowerPoint slides, verbal discussion, written materials, and food models to support subject matter. The instructor gives an explanation of serving size versus portion size, changes in portions sizes over the last 20 years, and what consists of a serving from each food group. Flowsheet Row CARDIAC REHAB PHASE [facility]  Date  05/22/16  Educator  RD  Instruction Review Code  2- meets goals/outcomes      Stress Management:  -Group instruction provided by verbal instruction, video, and written materials to support subject matter.  Instructors review role of stress in heart disease and how to cope with stress positively.   Flowsheet Row CARDIAC REHAB PHASE [facility]  Date  05/15/16  Instruction Review Code  2- meets goals/outcomes      Exercising on Your Own:  -Group instruction provided by verbal instruction, power point, and written materials to support subject.  Instructors discuss benefits of exercise, components of exercise, frequency and intensity of exercise, and end points for exercise.  Also discuss use of nitroglycerin and activating EMS.  Review options of places to exercise outside of rehab.  Review guidelines for sex with heart disease.   Cardiac Drugs I:  -Group instruction provided by verbal instruction and written materials to support subject.  Instructor reviews cardiac drug classes: antiplatelets, anticoagulants, beta blockers, and statins.  Instructor discusses reasons, side effects, and lifestyle considerations for each drug class. Flowsheet Row CARDIAC REHAB PHASE II EXERCISE [facility]29/[facility]  Date  05/29/16  Educator  pharmD   Instruction Review Code  2- meets goals/outcomes       Cardiac Drugs II:  -Group instruction provided by verbal instruction and written materials to support subject.  Instructor reviews cardiac drug classes: angiotensin converting enzyme inhibitors (ACE-I), angiotensin II  receptor blockers (ARBs), nitrates, and calcium channel blockers.  Instructor discusses reasons, side effects, and lifestyle considerations for each drug class. Flowsheet Row CARDIAC REHAB PHASE II EXERCISE from 05/29/2016 in [facility] CARDIAC REHAB  Date  04/24/16  Instruction Review Code  2- meets goals/outcomes      Anatomy and Physiology of the Circulatory System:  -Group instruction provided by verbal instruction, video, and written materials to support subject.  Reviews functional anatomy of heart, how it relates to various diagnoses, and what role the heart plays in the overall system. Flowsheet Row CARDIAC REHAB PHASE II EXERCISE from 05/29/2016 in [facility] CARDIAC REHAB  Date  05/01/16  Instruction Review Code  2- meets goals/outcomes      Knowledge Questionnaire Score:     Knowledge Questionnaire Score - 04/11/16 1239      Knowledge Questionnaire Score   Pre Score 6/24      Core Components/Risk Factors/Patient Goals at Admission:     Personal Goals and Risk Factors at Admission - 04/11/16 0924      Core Components/Risk Factors/Patient Goals on Admission    Weight Management Yes;Weight Loss   Intervention Weight Management: Develop a combined nutrition and exercise program designed to reach desired caloric intake, while maintaining appropriate intake of nutrient and fiber, sodium and fats, and appropriate energy expenditure required for the weight goal.;Weight Management: Provide education and appropriate resources to help participant work on and attain dietary goals.;Weight Management/Obesity: Establish reasonable short term and long term weight goals.   Expected Outcomes Short Term: Continue to assess and modify  interventions until short term weight is achieved;Long Term: Adherence to nutrition and physical activity/exercise program aimed toward attainment of established weight goal;Weight Maintenance: Understanding of the daily nutrition guidelines, which includes 25-35% calories from fat, 7% or less cal from saturated fats, less than 200mg  cholesterol, less than 1.5gm of sodium, & 5 or more servings of fruits and vegetables daily;Weight Loss: Understanding of general recommendations for a balanced deficit meal plan, which promotes 1-2 lb weight loss per week and includes a negative energy balance of [phone] kcal/d   Increase Strength and Stamina Yes   Intervention Develop an individualized exercise prescription for aerobic and resistive training based on initial evaluation findings, risk stratification, comorbidities and participant's personal goals.;Provide advice, education, support and counseling about physical activity/exercise needs.   Expected Outcomes Achievement of increased cardiorespiratory fitness and enhanced flexibility, muscular endurance and strength shown through measurements of functional capacity and personal statement of participant.   Improve shortness of breath with ADL's Yes   Intervention Provide education, individualized exercise plan and daily activity instruction to help decrease symptoms of SOB with activities of daily living.   Expected Outcomes Short Term: Achieves a reduction of symptoms when performing activities of daily living.   Lipids Yes   Intervention Provide education and support for participant on nutrition & aerobic/resistive exercise along with prescribed medications to achieve LDL 70mg , HDL >40mg .   Expected Outcomes Short Term: Participant states understanding of desired cholesterol values and is compliant with medications prescribed. Participant is following exercise prescription and nutrition guidelines.;Long Term: Cholesterol controlled with medications as  prescribed, with individualized exercise RX and with personalized nutrition plan. Value goals: LDL < 70mg , HDL > 40 mg.   Stress Yes   Intervention Offer individual and/or small group education and counseling on adjustment to heart disease, stress management and health-related lifestyle change. Teach and support self-help strategies.;Refer participants experiencing significant psychosocial distress to appropriate mental  health specialists for further evaluation and treatment. When possible, include family members and significant others in education/counseling sessions.   Expected Outcomes Long Term: Emotional wellbeing is indicated by absence of clinically significant psychosocial distress or social isolation.;Short Term: Participant demonstrates changes in health-related behavior, relaxation and other stress management skills, ability to obtain effective social support, and compliance with psychotropic medications if prescribed.   Personal Goal Other Yes   Personal Goal short - more energy, reduce SOB and lose 5lbs   Intervention Provide exercise education and guidelines to assist with energy levels and weightloss   Expected Outcomes Pt will be able to lose 5lbs and have increased energy      Core Components/Risk Factors/Patient Goals Review:      Goals and Risk Factor Review    Row Name 04/11/16 1418 05/24/16 1034           Core Components/Risk Factors/Patient Goals Review   Personal Goals Review Weight Management/Obesity;Lipids Improve shortness of breath with ADL's;Other      Review  - SOB is improving, walking for 1x/week. Discussed adding 1-2 more days in addition to CRPII      Expected Outcomes  - Pt will be consistent with HEP and exercise 2x/week in addition to cardiac rehab         Core Components/Risk Factors/Patient Goals at Discharge (Final Review):      Goals and Risk Factor Review - 05/24/16 1034      Core Components/Risk Factors/Patient Goals Review   Personal  Goals Review Improve shortness of breath with ADL's;Other   Review SOB is improving, walking for 1x/week. Discussed adding 1-2 more days in addition to CRPII   Expected Outcomes Pt will be consistent with HEP and exercise 2x/week in addition to cardiac rehab      ITP Comments:     ITP Comments    Row Name 04/11/16 0922 05/17/16 0807         ITP Comments Dr. Armanda Magic, Medical Director 05/17/16 Patient attended Hypertension education class. Meets goals/outcomes.         Comments:  Pt is making expected progress toward personal goals after completing  20 sessions. Recommend continued exercise and life style modification education including  stress management and relaxation techniques to decrease cardiac risk profile.  Psychosocial Assessment remains unchanged from previous assessment, pt has supportive family with no psychosocial needs, no further intervention warranted at this time. Karlene Lineman RN

## 2016-05-31 ENCOUNTER — Encounter (HOSPITAL_COMMUNITY)
Admission: RE | Admit: 2016-05-31 | Discharge: 2016-05-31 | Disposition: A | Discharge status: Home / Self Care | Payer: Medicare Other | Source: Ambulatory Visit | Attending: Cardiovascular Disease | Admitting: Cardiovascular Disease

## 2016-05-31 DIAGNOSIS — Z955 Presence of coronary angioplasty implant and graft: Secondary | ICD-10-CM

## 2016-06-03 ENCOUNTER — Encounter (HOSPITAL_COMMUNITY)
Admission: RE | Admit: 2016-06-03 | Discharge: 2016-06-03 | Disposition: A | Discharge status: Home / Self Care | Payer: Medicare Other | Source: Ambulatory Visit | Attending: Cardiovascular Disease | Admitting: Cardiovascular Disease

## 2016-06-03 DIAGNOSIS — Z955 Presence of coronary angioplasty implant and graft: Secondary | ICD-10-CM

## 2016-06-05 ENCOUNTER — Encounter (HOSPITAL_COMMUNITY)
Admission: RE | Admit: 2016-06-05 | Discharge: 2016-06-05 | Disposition: A | Discharge status: Home / Self Care | Payer: Medicare Other | Source: Ambulatory Visit | Attending: Cardiovascular Disease | Admitting: Cardiovascular Disease

## 2016-06-05 DIAGNOSIS — Z955 Presence of coronary angioplasty implant and graft: Secondary | ICD-10-CM

## 2016-06-07 ENCOUNTER — Encounter (HOSPITAL_COMMUNITY)
Admission: RE | Admit: 2016-06-07 | Discharge: 2016-06-07 | Disposition: A | Discharge status: Home / Self Care | Payer: Medicare Other | Source: Ambulatory Visit | Attending: Cardiovascular Disease | Admitting: Cardiovascular Disease

## 2016-06-07 DIAGNOSIS — Z955 Presence of coronary angioplasty implant and graft: Secondary | ICD-10-CM | POA: Diagnosis not present

## 2016-06-10 ENCOUNTER — Encounter (HOSPITAL_COMMUNITY)
Admission: RE | Admit: 2016-06-10 | Discharge: 2016-06-10 | Disposition: A | Discharge status: Home / Self Care | Payer: Medicare Other | Source: Ambulatory Visit | Attending: Cardiovascular Disease | Admitting: Cardiovascular Disease

## 2016-06-10 DIAGNOSIS — Z955 Presence of coronary angioplasty implant and graft: Secondary | ICD-10-CM | POA: Diagnosis not present

## 2016-06-12 ENCOUNTER — Encounter (HOSPITAL_COMMUNITY)
Admission: RE | Admit: 2016-06-12 | Discharge: 2016-06-12 | Disposition: A | Discharge status: Home / Self Care | Payer: Medicare Other | Source: Ambulatory Visit | Attending: Cardiovascular Disease | Admitting: Cardiovascular Disease

## 2016-06-12 DIAGNOSIS — Z955 Presence of coronary angioplasty implant and graft: Secondary | ICD-10-CM | POA: Diagnosis not present

## 2016-06-13 ENCOUNTER — Encounter: Payer: Self-pay | Admitting: Cardiovascular Disease

## 2016-06-13 ENCOUNTER — Ambulatory Visit (INDEPENDENT_AMBULATORY_CARE_PROVIDER_SITE_OTHER): Payer: Medicare Other | Admitting: Cardiovascular Disease

## 2016-06-13 VITALS — BP 120/60 | HR 48 | Ht 70.0 in | Wt 174.0 lb

## 2016-06-13 DIAGNOSIS — I2510 Atherosclerotic heart disease of native coronary artery without angina pectoris: Secondary | ICD-10-CM | POA: Diagnosis not present

## 2016-06-13 DIAGNOSIS — Z9861 Coronary angioplasty status: Secondary | ICD-10-CM

## 2016-06-13 DIAGNOSIS — E785 Hyperlipidemia, unspecified: Secondary | ICD-10-CM

## 2016-06-13 DIAGNOSIS — I493 Ventricular premature depolarization: Secondary | ICD-10-CM | POA: Diagnosis not present

## 2016-06-13 NOTE — Patient Instructions (Signed)

## 2016-06-13 NOTE — Progress Notes (Signed)
Chief Complaint  Patient presents with  . Coronary Artery Disease    History of Present Illness: 65 yo male with history of CAD, PVCs, HLD, GERD, pre-DM, esophageal stricture who is here today for cardiac follow up. He was [facility] to [facility] 2017 with unstable angina following abnormal stress test in primary care. Cardiac cath 02/28/16 with mild RCA stenosis, mild LAD stenosis and severe stenosis first obtuse marginal branch treated with a DES x 1. LV function was normal. He was noted to have ventricular bigeminy/PVCs post-cath. Metoprolol was started.   He comes in today for follow-up doing well. No CP or SOB. He is unaware of any palpitations.  Primary Care Physician: Enrique SackGREEN, EDWIN JAY, MD   Past Medical History:  Diagnosis Date  . CAD (coronary artery disease)    a. DES to OM1 02/2016.   . Cataract    bil cateracts  . Diverticulosis   . Esophageal stricture   . GERD (gastroesophageal reflux disease)   . Hemorrhoids   . Hiatal hernia   . Hyperlipidemia   . IBS (irritable bowel syndrome)   . Multiple food allergies    "lots of food allergies; beef, tuna, flounder, potatoes, garlic, tomatoes, apples, bananas, sesame seeds, shellfish  . Pre-diabetes   . PVC's (premature ventricular contractions)     Past Surgical History:  Procedure Laterality Date  . CARDIAC CATHETERIZATION N/A 02/28/2016   Procedure: Left Heart Cath and Coronary Angiography;  Surgeon: Iran OuchMuhammad A Arida, MD;  Location: [facility] INVASIVE CV [facility];  Service: Cardiovascular;  Laterality: N/A;  . CARDIAC CATHETERIZATION N/A 02/28/2016   Procedure: Coronary Stent Intervention;  Surgeon: Iran OuchMuhammad A Arida, MD;  Location: [facility] INVASIVE CV [facility];  Service: Cardiovascular;  Laterality: N/A;  . COLONOSCOPY    . CYSTOSCOPY WITH STENT PLACEMENT    . ESOPHAGOGASTRODUODENOSCOPY (EGD) WITH ESOPHAGEAL DILATION    . HEMORRHOID BANDING    . HERNIA REPAIR    . SURGERY SCROTAL / TESTICULAR Left ~ 1960   "hernia"  . TONSILLECTOMY    .  UPPER GASTROINTESTINAL ENDOSCOPY      Current Outpatient Prescriptions  Medication Sig Dispense Refill  . aspirin EC 81 MG EC tablet Take 1 tablet (81 mg total) by mouth daily.    Marland Kitchen. atorvastatin (LIPITOR) 40 MG tablet Take 1 tablet (40 mg total) by mouth daily. 30 tablet 5  . CIALIS 5 MG tablet Take 5 mg by mouth daily as needed for erectile dysfunction.   98  . clopidogrel (PLAVIX) 75 MG tablet Take 1 tablet (75 mg total) by mouth daily with breakfast. 30 tablet 11  . docusate sodium (COLACE) 100 MG capsule Take 500 mg by mouth daily.    . halobetasol (ULTRAVATE) 0.05 % cream Apply 1 application topically daily.     . magnesium oxide (MAG-OX) 400 MG tablet Take 400 mg by mouth daily.    . metoprolol tartrate (LOPRESSOR) 25 MG tablet Take 1 tablet (25 mg total) by mouth 2 (two) times daily. 60 tablet 5  . nitroGLYCERIN (NITROSTAT) 0.4 MG SL tablet Place 1 tablet (0.4 mg total) under the tongue every 5 (five) minutes x 3 doses as needed for chest pain. 25 tablet 2  . pantoprazole (PROTONIX) 40 MG tablet Take 1 tablet (40 mg total) by mouth daily. 30 tablet 5  . Probiotic Product (PROBIOTIC DAILY PO) Take 1 capsule by mouth daily.     Marland Kitchen. triamcinolone (NASACORT ALLERGY 24HR) 55 MCG/ACT AERO nasal inhaler Place 2 sprays into the  nose daily as needed (allergies).      No current facility-administered medications for this visit.     Allergies  Allergen Reactions  . Aleve [Naproxen Sodium]     Doesn't remember  . Apple Swelling  . Banana Nausea And Vomiting  . Beef-Derived Products Nausea And Vomiting  . Erythromycin Swelling    Mouth swells  . Fish Allergy Swelling  . Garlic Swelling  . Sesame Seed (Diagnostic) Swelling  . Shellfish Allergy Other (See Comments)    Not sure of reaction found out about allergy through [place]  . Sulfonamide Derivatives Swelling    Mouth swelling  . Tomato Nausea And Vomiting  . Iodine Swelling    "I blow up like a balloon"  . Strawberry (Diagnostic)  Rash    Social History   Social History  . Marital status: Married    Spouse name: N/A  . Number of children: N/A  . Years of education: N/A   Occupational History  . Not on file.   Social History Main Topics  . Smoking status: Former Smoker    Packs/day: 1.00    Years: 20.00    Types: Cigarettes  . Smokeless tobacco: Never Used     Comment: "quit smoking cigarettes in the 1980s"  . Alcohol use 0.0 oz/week     Comment: 02/27/2016 "nothing in the last few weeks; no regular pattern"  . Drug use: No  . Sexual activity: Yes   Other Topics Concern  . Not on file   Social History Narrative  . No narrative on file    Family History  Problem Relation Age of Onset  . Heart disease Mother   . Heart disease Father   . Colon cancer Neg Hx   . Esophageal cancer Neg Hx   . Rectal cancer Neg Hx   . Stomach cancer Neg Hx     Review of Systems:  As stated in the HPI and otherwise negative.   BP 120/60   Pulse (!) 48   Ht 5\' 10"  (1.778 m)   Wt 174 lb (78.9 kg)   BMI 24.97 kg/m   Physical Examination: General: Well developed, well nourished, NAD  HEENT: OP clear, mucus membranes moist  SKIN: warm, dry. No rashes. Neuro: No focal deficits  Musculoskeletal: Muscle strength 5/5 all ext  Psychiatric: Mood and affect normal  Neck: No JVD, no carotid bruits, no thyromegaly, no lymphadenopathy.  Lungs:Clear bilaterally, no wheezes, rhonci, crackles Cardiovascular: Regular rate and rhythm. No murmurs, gallops or rubs. Abdomen:Soft. Bowel sounds present. Non-tender.  Extremities: No lower extremity edema. Pulses are 2 + in the bilateral DP/PT.  Cardiac cath 02/28/16:  Prox RCA lesion, 30% stenosed.  Mid RCA to Dist RCA lesion, 40% stenosed.  Prox LAD to Mid LAD lesion, 30% stenosed.  1st Mrg lesion, 99% stenosed. Post intervention, there is a 0% residual stenosis.  The left ventricular systolic function is normal.   1. Severe (99% ) one-vessel coronary artery disease  involving proximal OM1 which is very large and supplies most of the left circumflex distribution. 2. Normal LV systolic function and mildly elevated left ventricular end-diastolic pressure. 3. Successful angioplasty and drug-eluting stent placement to OM 1.  EKG:  EKG is not ordered today. The ekg ordered today demonstrates   Recent Labs: 02/27/2016: TSH 1.368 02/29/2016: Hemoglobin 14.7; Platelets 271 04/24/2016: ALT 24; BUN 15; Creat 0.86; Potassium 4.3; Sodium 139   Lipid Panel    Component Value Date/Time   CHOL 124 (L) 04/24/2016  0847   TRIG 113 04/24/2016 0847   HDL 36 (L) 04/24/2016 0847   CHOLHDL 3.4 04/24/2016 0847   VLDL 23 04/24/2016 0847   LDLCALC 65 04/24/2016 0847     Wt Readings from Last 3 Encounters:  06/13/16 174 lb (78.9 kg)  05/02/16 175 lb 6.4 oz (79.6 kg)  04/11/16 174 lb 13.2 oz (79.3 kg)     Other studies Reviewed: Additional studies/ records that were reviewed today include: . Review of the above records demonstrates:   Assessment and Plan:   1. CAD: He has no chest pain suggestive of angina. Recent DES placement OM1 May 2017. He is on ASA, Plavix, statin and beta blocker.   2. HLD: He is on a statin. LDL well controlled.   3. PVCs: He has worn a Holter monitor which demonstrated PVCs. He has seen Dr. Graciela Husbands and plans to continue beta blocker.   Current medicines are reviewed at length with the patient today.  The patient does not have concerns regarding medicines.  The following changes have been made:  no change  Labs/ tests ordered today include:  No orders of the defined types were placed in this encounter.   Disposition:   FU with me in 6 months   Signed, Verne Carrow, MD 06/13/2016 8:55 AM    [facility] [address] [place], [place], [place]  [address] Phone: [phone]; Fax: [phone]

## 2016-06-14 ENCOUNTER — Encounter (HOSPITAL_COMMUNITY)
Admission: RE | Admit: 2016-06-14 | Discharge: 2016-06-14 | Disposition: A | Discharge status: Home / Self Care | Payer: Medicare Other | Source: Ambulatory Visit | Attending: Cardiovascular Disease | Admitting: Cardiovascular Disease

## 2016-06-14 DIAGNOSIS — Z7982 Long term (current) use of aspirin: Secondary | ICD-10-CM | POA: Insufficient documentation

## 2016-06-14 DIAGNOSIS — Z79899 Other long term (current) drug therapy: Secondary | ICD-10-CM | POA: Diagnosis not present

## 2016-06-14 DIAGNOSIS — I2510 Atherosclerotic heart disease of native coronary artery without angina pectoris: Secondary | ICD-10-CM | POA: Diagnosis not present

## 2016-06-14 DIAGNOSIS — Z87891 Personal history of nicotine dependence: Secondary | ICD-10-CM | POA: Diagnosis not present

## 2016-06-14 DIAGNOSIS — R7303 Prediabetes: Secondary | ICD-10-CM | POA: Insufficient documentation

## 2016-06-14 DIAGNOSIS — Z955 Presence of coronary angioplasty implant and graft: Secondary | ICD-10-CM | POA: Diagnosis not present

## 2016-06-19 ENCOUNTER — Encounter (HOSPITAL_COMMUNITY)
Admission: RE | Admit: 2016-06-19 | Discharge: 2016-06-19 | Disposition: A | Discharge status: Home / Self Care | Payer: Medicare Other | Source: Ambulatory Visit | Attending: Cardiovascular Disease | Admitting: Cardiovascular Disease

## 2016-06-19 DIAGNOSIS — Z955 Presence of coronary angioplasty implant and graft: Secondary | ICD-10-CM | POA: Diagnosis not present

## 2016-06-21 ENCOUNTER — Encounter (HOSPITAL_COMMUNITY)
Admission: RE | Admit: 2016-06-21 | Discharge: 2016-06-21 | Disposition: A | Discharge status: Home / Self Care | Payer: Medicare Other | Source: Ambulatory Visit | Attending: Cardiovascular Disease | Admitting: Cardiovascular Disease

## 2016-06-21 DIAGNOSIS — Z955 Presence of coronary angioplasty implant and graft: Secondary | ICD-10-CM | POA: Diagnosis not present

## 2016-06-24 ENCOUNTER — Encounter (HOSPITAL_COMMUNITY)
Admission: RE | Admit: 2016-06-24 | Discharge: 2016-06-24 | Disposition: A | Discharge status: Home / Self Care | Payer: Medicare Other | Source: Ambulatory Visit | Attending: Cardiovascular Disease | Admitting: Cardiovascular Disease

## 2016-06-24 ENCOUNTER — Encounter: Payer: Self-pay | Admitting: Physician Assistant

## 2016-06-24 DIAGNOSIS — Z955 Presence of coronary angioplasty implant and graft: Secondary | ICD-10-CM | POA: Diagnosis not present

## 2016-06-24 NOTE — Progress Notes (Signed)
Pt with broken blood vessel to right eye.  Pt denies any pain to the eye  Pt remarks that it was present last week and went away Pt has upcoming eye appt on this week.  Pt plans to have his eye md take a look at his eye.  Advised pt not to use weights today with exercise.  Will follow up on Wednesday. Marcello Tuzzolino Milana Kidneyarlton RN, BSN;

## 2016-06-26 ENCOUNTER — Encounter (HOSPITAL_COMMUNITY)
Admission: RE | Admit: 2016-06-26 | Discharge: 2016-06-26 | Disposition: A | Discharge status: Home / Self Care | Payer: Medicare Other | Source: Ambulatory Visit | Attending: Cardiovascular Disease | Admitting: Cardiovascular Disease

## 2016-06-26 DIAGNOSIS — Z955 Presence of coronary angioplasty implant and graft: Secondary | ICD-10-CM | POA: Diagnosis not present

## 2016-06-27 NOTE — Progress Notes (Signed)
Cardiac Individual Treatment Plan  Patient Details  Name: Travis Jacobs MRN: [id number] Date of Birth: 1951/07/02 Referring Provider:   Flowsheet Row CARDIAC REHAB PHASE II ORIENTATION from 04/11/2016 in [facility]  Referring Provider  Earney HamburgMcAlhany, Chris MD      Initial Encounter Date:  Flowsheet Row CARDIAC REHAB PHASE II ORIENTATION [facility]/11/2016 [facility]  Date  04/11/16  Referring Provider  Earney HamburgMcAlhany, Chris MD      Visit Diagnosis: Stented coronary artery  Patient's Home Medications on Admission:  Current Outpatient Prescriptions:  .  aspirin EC 81 MG EC tablet, Take 1 tablet (81 mg total) by mouth daily., Disp: , Rfl:  .  atorvastatin (LIPITOR) 40 MG tablet, Take 1 tablet (40 mg total) by mouth daily., Disp: 30 tablet, Rfl: 5 .  CIALIS 5 MG tablet, Take 5 mg by mouth daily as needed for erectile dysfunction. , Disp: , Rfl: 98 .  clopidogrel (PLAVIX) 75 MG tablet, Take 1 tablet (75 mg total) by mouth daily with breakfast., Disp: 30 tablet, Rfl: 11 .  docusate sodium (COLACE) 100 MG capsule, Take 500 mg by mouth daily., Disp: , Rfl:  .  halobetasol (ULTRAVATE) 0.05 % cream, Apply 1 application topically daily. , Disp: , Rfl:  .  magnesium oxide (MAG-OX) 400 MG tablet, Take 400 mg by mouth daily., Disp: , Rfl:  .  metoprolol tartrate (LOPRESSOR) 25 MG tablet, Take 1 tablet (25 mg total) by mouth 2 (two) times daily., Disp: 60 tablet, Rfl: 5 .  nitroGLYCERIN (NITROSTAT) 0.4 MG SL tablet, Place 1 tablet (0.4 mg total) under the tongue every 5 (five) minutes x 3 doses as needed for chest pain., Disp: 25 tablet, Rfl: 2 .  pantoprazole (PROTONIX) 40 MG tablet, Take 1 tablet (40 mg total) by mouth daily., Disp: 30 tablet, Rfl: 5 .  Probiotic Product (PROBIOTIC DAILY PO), Take 1 capsule by mouth daily. , Disp: , Rfl:  .  triamcinolone (NASACORT ALLERGY 24HR) 55 MCG/ACT AERO nasal inhaler, Place 2 sprays into the nose  daily as needed (allergies). , Disp: , Rfl:   Past Medical History: Past Medical History:  Diagnosis Date  . CAD (coronary artery disease)    a. DES to OM1 02/2016.   . Cataract    bil cateracts  . Diverticulosis   . Esophageal stricture   . GERD (gastroesophageal reflux disease)   . Hemorrhoids   . Hiatal hernia   . Hyperlipidemia   . IBS (irritable bowel syndrome)   . Multiple food allergies    "lots of food allergies; beef, tuna, flounder, potatoes, garlic, tomatoes, apples, bananas, sesame seeds, shellfish  . Pre-diabetes   . PVC's (premature ventricular contractions)     Tobacco Use: History  Smoking Status  . Former Smoker  . Packs/day: 1.00  . Years: 20.00  . Types: Cigarettes  Smokeless Tobacco  . Never Used    Comment: "quit smoking cigarettes in the 1980s"    Labs: Recent Review Flowsheet Data    Labs for ITP Cardiac and Pulmonary Rehab Latest Ref Rng & Units 02/27/2016 02/28/2016 04/24/2016   Cholestrol 125 - 200 mg/dL - 782177 956(O124(L)   LDLCALC <130 mg/dL - 130(Q110(H) 65   HDL >=65>=40 mg/dL - 78(I39(L) 69(G36(L)   Trlycerides <150 mg/dL - 295138 284113   Hemoglobin A1c 4.8 - 5.6 % 6.1(H) - -      Capillary Blood Glucose: No results found for: GLUCAP   Exercise Target  Goals:    Exercise Program Goal: Individual exercise prescription set with THRR, safety & activity barriers. Participant demonstrates ability to understand and report RPE using BORG scale, to self-measure pulse accurately, and to acknowledge the importance of the exercise prescription.  Exercise Prescription Goal: Starting with aerobic activity 30 plus minutes a day, 3 days per week for initial exercise prescription. Provide home exercise prescription and guidelines that participant acknowledges understanding prior to discharge.  Activity Barriers & Risk Stratification:     Activity Barriers & Cardiac Risk Stratification - 04/11/16 1520      Activity Barriers & Cardiac Risk Stratification   Cardiac Risk  Stratification Moderate      6 Minute Walk:     6 Minute Walk    Row Name 04/11/16 1240         6 Minute Walk   Phase Initial     Distance 1852 feet     Walk Time 6 minutes     # of Rest Breaks 0     MPH 3.5     METS 4.3     RPE 13     Perceived Dyspnea  2     VO2 Peak 14.9     Symptoms Yes (comment)     Comments mild to mod SOB     Resting HR 55 bpm     Resting BP 114/70     Max Ex. HR 88 bpm     Max Ex. BP 134/80     2 Minute Post BP 108/70        Initial Exercise Prescription:     Initial Exercise Prescription - 04/11/16 1200      Date of Initial Exercise RX and Referring Provider   Date 04/11/16   Referring Provider Earney Hamburg MD     Treadmill   MPH 2.8   Grade 1   Minutes 10   METs 3.53     Bike   Level 0.8   Minutes 10   METs 3     NuStep   Level 3   Minutes 10   METs 3     Prescription Details   Frequency (times per week) 3   Duration Progress to 30 minutes of continuous aerobic without signs/symptoms of physical distress     Intensity   THRR 40-80% of Max Heartrate 62-123   Ratings of Perceived Exertion 11-13   Perceived Dyspnea 0-4     Progression   Progression Continue to progress workloads to maintain intensity without signs/symptoms of physical distress.     Resistance Training   Training Prescription Yes   Weight 3lbs   Reps 10-12      Perform Capillary Blood Glucose checks as needed.  Exercise Prescription Changes:      Exercise Prescription Changes    Row Name 04/22/16 1700 05/01/16 1600 05/08/16 1000 05/24/16 1000 06/05/16 1100     Exercise Review   Progression Yes Yes Yes Yes Yes     Response to Exercise   Blood Pressure (Admit) 108/62 116/70 108/72 98/60 106/70   Blood Pressure (Exercise) 128/70 120/62 162/82  rchk BP 138/80 140/70  rchk BP 138/80 162/80  rchk BP 138/80   Blood Pressure (Exit) 104/68 100/60 104/60 110/60 140/70   Heart Rate (Admit) 64 bpm 65 bpm 63 bpm 59 bpm 61 bpm   Heart Rate  (Exercise) 91 bpm 93 bpm 91 bpm 89 bpm 96 bpm   Heart Rate (Exit) 64 bpm 64 bpm 64 bpm 66 bpm  61 bpm   Rating of Perceived Exertion (Exercise) 13 14 14 13 13    Duration Progress to 30 minutes of continuous aerobic without signs/symptoms of physical distress Progress to 30 minutes of continuous aerobic without signs/symptoms of physical distress Progress to 30 minutes of continuous aerobic without signs/symptoms of physical distress Progress to 30 minutes of continuous aerobic without signs/symptoms of physical distress Progress to 30 minutes of continuous aerobic without signs/symptoms of physical distress   Intensity THRR unchanged THRR unchanged THRR unchanged THRR unchanged THRR unchanged     Progression   Progression Continue to progress workloads to maintain intensity without signs/symptoms of physical distress. Continue to progress workloads to maintain intensity without signs/symptoms of physical distress. Continue to progress workloads to maintain intensity without signs/symptoms of physical distress. Continue to progress workloads to maintain intensity without signs/symptoms of physical distress. Continue to progress workloads to maintain intensity without signs/symptoms of physical distress.   Average METs 3.1 3.7 4.4 4.3 4.9     Resistance Training   Training Prescription Yes Yes Yes Yes Yes   Weight 4lbs 4lbs 4lbs 4lbs 6lbs   Reps 10-12 10-12 10-12 10-12 10-12     Treadmill   MPH 2.8 3 3 3  3.3   Grade 1 2 2 3 4    Minutes 19 10 10 10 10    METs 3.53  -  - 4.12 4.95     Recumbant Bike   Level 3 3.5 3.5 3.5 3.6   Watts  - 50 40 44 45   Minutes 10 10 10 10 10    METs 2.3 2.3 2.1 3.9 3.9     NuStep   Level 3 3 4 4 5    Minutes 10 10 10 10 10    METs 3.1 4.1 4.6 4.7 4.9     Home Exercise Plan   Plans to continue exercise at  - Home  HEP was reviewed on 04/29/16 see progress note Home  HEP was reviewed on 04/29/16 see progress note Home  HEP was reviewed on 04/29/16 see progress  note Home  HEP was reviewed on 04/29/16 see progress note   Frequency  - Add 2 additional days to program exercise sessions. Add 2 additional days to program exercise sessions. Add 2 additional days to program exercise sessions. Add 2 additional days to program exercise sessions.   Row Name 06/21/16 1600             Exercise Review   Progression Yes         Response to Exercise   Blood Pressure (Admit) 124/70       Blood Pressure (Exercise) 138/78  rchk BP 138/80       Blood Pressure (Exit) 104/62       Heart Rate (Admit) 62 bpm       Heart Rate (Exercise) 102 bpm       Heart Rate (Exit) 60 bpm       Rating of Perceived Exertion (Exercise) 13       Duration Progress to 30 minutes of continuous aerobic without signs/symptoms of physical distress       Intensity THRR unchanged         Progression   Progression Continue to progress workloads to maintain intensity without signs/symptoms of physical distress.       Average METs 5.4         Resistance Training   Training Prescription Yes       Weight 6lbs       Reps  10-12         Treadmill   MPH 3.3       Grade 6       Minutes 10       METs 5.8         Recumbant Bike   Level 3.8       Watts 45       Minutes 10       METs 3.9         NuStep   Level 6       Minutes 10       METs 5         Home Exercise Plan   Plans to continue exercise at Home  HEP was reviewed on 04/29/16 see progress note       Frequency Add 2 additional days to program exercise sessions.          Exercise Comments:      Exercise Comments    Row Name 05/24/16 1033 06/05/16 1117 06/21/16 1636       Exercise Comments Reviewed METs and goals. Pt is tolerating exercise well; will continue to monitor exercise progression Reviewed METs. Pt is tolerating exercise well; will continue to monitor exercise progression Reviewed METs and goals. Pt is tolerating exercise well; will continue to monitor exercise progression        Discharge Exercise  Prescription (Final Exercise Prescription Changes):     Exercise Prescription Changes - 06/21/16 1600      Exercise Review   Progression Yes     Response to Exercise   Blood Pressure (Admit) 124/70   Blood Pressure (Exercise) 138/78  rchk BP 138/80   Blood Pressure (Exit) 104/62   Heart Rate (Admit) 62 bpm   Heart Rate (Exercise) 102 bpm   Heart Rate (Exit) 60 bpm   Rating of Perceived Exertion (Exercise) 13   Duration Progress to 30 minutes of continuous aerobic without signs/symptoms of physical distress   Intensity THRR unchanged     Progression   Progression Continue to progress workloads to maintain intensity without signs/symptoms of physical distress.   Average METs 5.4     Resistance Training   Training Prescription Yes   Weight 6lbs   Reps 10-12     Treadmill   MPH 3.3   Grade 6   Minutes 10   METs 5.8     Recumbant Bike   Level 3.8   Watts 45   Minutes 10   METs 3.9     NuStep   Level 6   Minutes 10   METs 5     Home Exercise Plan   Plans to continue exercise at Home  HEP was reviewed on 04/29/16 see progress note   Frequency Add 2 additional days to program exercise sessions.      Nutrition:  Target Goals: Understanding of nutrition guidelines, daily intake of sodium 1500mg , cholesterol 200mg , calories 30% from fat and 7% or less from saturated fats, daily to have 5 or more servings of fruits and vegetables.  Biometrics:     Pre Biometrics - 04/11/16 1249      Pre Biometrics   Waist Circumference 38.5 inches   Hip Circumference 40.5 inches   Waist to Hip Ratio 0.95 %   Triceps Skinfold 20 mm   % Body Fat 26.5 %   Grip Strength 46.5 kg   Flexibility 9 in   Single Leg Stand 16.09 seconds       Nutrition Therapy  Plan and Nutrition Goals:     Nutrition Therapy & Goals - 05/10/16 0818      Nutrition Therapy   Diet Therapeutic Lifestyle Changes     Personal Nutrition Goals   Personal Goal #1 1-2 lb wt loss per week to pt's goal  wt of 169 lb (5 lb decrease from admission wt)   Personal Goal #2 Knowledge re: basic diabetic diet principles     Intervention Plan   Intervention Prescribe, educate and counsel regarding individualized specific dietary modifications aiming towards targeted core components such as weight, hypertension, lipid management, diabetes, heart failure and other comorbidities.;Nutrition handout(s) given to patient.  Pre-diabetes handout given   Expected Outcomes Short Term Goal: Understand basic principles of dietary content, such as calories, fat, sodium, cholesterol and nutrients.;Long Term Goal: Adherence to prescribed nutrition plan.      Nutrition Discharge: Nutrition Scores:     Nutrition Assessments - 04/22/16 1519      MEDFICTS Scores   Pre Score 12      Nutrition Goals Re-Evaluation:   Psychosocial: Target Goals: Acknowledge presence or absence of depression, maximize coping skills, provide positive support system. Participant is able to verbalize types and ability to use techniques and skills needed for reducing stress and depression.  Initial Review & Psychosocial Screening:     Initial Psych Review & Screening - 04/11/16 1416      Family Dynamics   Good Support System? Yes     Barriers   Psychosocial barriers to participate in program There are no identifiable barriers or psychosocial needs.     Screening Interventions   Interventions Encouraged to exercise      Quality of Life Scores:     Quality of Life - 04/11/16 1250      Quality of Life Scores   Health/Function Pre 26.77 %   Socioeconomic Pre 30 %   Psych/Spiritual Pre 30 %   Family Pre 30 %   GLOBAL Pre 28.69 %      PHQ-9: Recent Review Flowsheet Data    Depression screen [facility] 2/9 04/17/2016   Decreased Interest 0   Down, Depressed, Hopeless 0   PHQ - 2 Score 0      Psychosocial Evaluation and Intervention:     Psychosocial Evaluation - 06/27/16 1539      Psychosocial Evaluation &  Interventions   Comments Pyschosocial Assessment reveals no needs at this time, no further intervention warranted      Psychosocial Re-Evaluation:     Psychosocial Re-Evaluation    Row Name 06/27/16 1539             Psychosocial Re-Evaluation   Interventions Encouraged to attend Cardiac Rehabilitation for the exercise          Vocational Rehabilitation: Provide vocational rehab assistance to qualifying candidates.   Vocational Rehab Evaluation & Intervention:     Vocational Rehab - 04/11/16 1415      Initial Vocational Rehab Evaluation & Intervention   Assessment shows need for Vocational Rehabilitation No      Education: Education Goals: Education classes will be provided on a weekly basis, covering required topics. Participant will state understanding/return demonstration of topics presented.  Learning Barriers/Preferences:     Learning Barriers/Preferences - 04/11/16 1411      Learning Barriers/Preferences   Learning Barriers None  no learning barriers noted   Learning Preferences Pictoral;Written Material      Education Topics: Count Your Pulse:  -Group instruction provided by verbal instruction, demonstration, patient participation  and written materials to support subject.  Instructors address importance of being able to find your pulse and how to count your pulse when at home without a heart monitor.  Patients get hands on experience counting their pulse with staff help and individually. Flowsheet Row CARDIAC REHAB PHASE II EXERCISE [facility]  Date  04/19/16  Educator  Karlene Linemanarlette Carlton, RN  Instruction Review Code  2- meets goals/outcomes      Heart Attack, Angina, and Risk Factor Modification:  -Group instruction provided by verbal instruction, video, and written materials to support subject.  Instructors address signs and symptoms of angina and heart attacks.    Also discuss risk factors for heart  disease and how to make changes to improve heart health risk factors. Flowsheet Row CARDIAC REHAB PHASE II EXERCISE [facility]19/2016 [facility]  Date  06/05/16  Instruction Review Code  2- meets goals/outcomes      Functional Fitness:  -Group instruction provided by verbal instruction, demonstration, patient participation, and written materials to support subject.  Instructors address safety measures for doing things around the house.  Discuss how to get up and down off the floor, how to pick things up properly, how to safely get out of a chair without assistance, and balance training. Flowsheet Row CARDIAC REHAB PHASE II EXERCISE from [facility]19/2016 [facility]  Date  05/03/16  Instruction Review Code  2- meets goals/outcomes      Meditation and Mindfulness:  -Group instruction provided by verbal instruction, patient participation, and written materials to support subject.  Instructor addresses importance of mindfulness and meditation practice to help reduce stress and improve awareness.  Instructor also leads participants through a meditation exercise.  Flowsheet Row CARDIAC REHAB PHASE II EXERC[facility]  Date  04/17/16  Instruction Review Code  2- meets goals/outcomes      Stretching for Flexibility and Mobility:  -Group instruction provided by verbal instruction, patient participation, and written materials to support subject.  Instructors lead participants through series of stretches that are designed to increase flexibility thus improving mobility.  These stretches are additional exercise for major muscle groups that are typically performed during regular warm up and cool down. Flowsheet Row CARDIAC REHAB PHASE II EXERCISE [facility] [place] [facility]  Date  05/10/16  Instruction Review Code  2- meets goals/outcomes      Hands Only CPR  Anytime:  -Group instruction provided by verbal instruction, video, patient participation and written materials to support subject.  Instructors co-teach with [facility] video for hands only CPR.  Participants get hands on experience with mannequins.   Nutrition I class: Heart Healthy Eating:  -Group instruction provided by PowerPoint slides, verbal discussion, and written materials to support subject matter. The instructor gives an explanation and review of the Therapeutic Lifestyle Changes diet recommendations, which includes a discussion on lipid goals, dietary fat, sodium, fiber, plant stanol/sterol esters, sugar, and the components of a well-balanced, healthy diet. Flowsheet Row CARDIAC REHAB PHASE II EXERCISE [facility] [place] [facility]  Date  04/23/16  Educator  RD  Instruction Review Code  2- meets goals/outcomes      Nutrition II class: Lifestyle Skills:  -Group instruction provided by PowerPoint slides, verbal discussion, and written materials to support subject matter. The instructor gives an explanation and review of label reading, grocery shopping  for heart health, heart healthy recipe modifications, and ways to make healthier choices when eating out. Flowsheet Row CARDIAC REHAB PHASE II EXERCISE from 06/19/2016 in [facility] CARDIAC REHAB  Date  05/07/16  Educator  RD  Instruction Review Code  2- meets goals/outcomes      Diabetes Question & Answer:  -Group instruction provided by PowerPoint slides, verbal discussion, and written materials to support subject matter. The instructor gives an explanation and review of diabetes co-morbidities, pre- and post-prandial blood glucose goals, pre-exercise blood glucose goals, signs, symptoms, and treatment of hypoglycemia and hyperglycemia, and foot care basics. Flowsheet Row CARDIAC REHAB PHASE II EXERCISE from 06/19/2016 in [facility] CARDIAC REHAB  Date  05/24/16  Educator  RD   Instruction Review Code  2- meets goals/outcomes      Diabetes Blitz:  -Group instruction provided by PowerPoint slides, verbal discussion, and written materials to support subject matter. The instructor gives an explanation and review of the physiology behind type 1 and type 2 diabetes, diabetes medications and rational behind using different medications, pre- and post-prandial blood glucose recommendations and Hemoglobin A1c goals, diabetes diet, and exercise including blood glucose guidelines for exercising safely.    Portion Distortion:  -Group instruction provided by PowerPoint slides, verbal discussion, written materials, and food models to support subject matter. The instructor gives an explanation of serving size versus portion size, changes in portions sizes over the last 20 years, and what consists of a serving from each food group. Flowsheet Row CARDIAC REHAB PHASE II EXERCISE from 06/19/2016 in [facility] CARDIAC REHAB  Date  05/22/16  Educator  RD  Instruction Review Code  2- meets goals/outcomes      Stress Management:  -Group instruction provided by verbal instruction, video, and written materials to support subject matter.  Instructors review role of stress in heart disease and how to cope with stress positively.   Flowsheet Row CARDIAC REHAB PHASE II EXERCISE from 06/19/2016 in [facility] CARDIAC REHAB  Date  05/15/16  Instruction Review Code  2- meets goals/outcomes      Exercising on Your Own:  -Group instruction provided by verbal instruction, power point, and written materials to support subject.  Instructors discuss benefits of exercise, components of exercise, frequency and intensity of exercise, and end points for exercise.  Also discuss use of nitroglycerin and activating EMS.  Review options of places to exercise outside of rehab.  Review guidelines for sex with heart disease.   Cardiac Drugs I:  -Group instruction provided by  verbal instruction and written materials to support subject.  Instructor reviews cardiac drug classes: antiplatelets, anticoagulants, beta blockers, and statins.  Instructor discusses reasons, side effects, and lifestyle considerations for each drug class. Flowsheet Row CARDIAC REHAB PHASE II EXERCISE from 06/19/2016 in [facility] CARDIAC REHAB  Date  05/29/16  Educator  pharmD   Instruction Review Code  2- meets goals/outcomes      Cardiac Drugs II:  -Group instruction provided by verbal instruction and written materials to support subject.  Instructor reviews cardiac drug classes: angiotensin converting enzyme inhibitors (ACE-I), angiotensin II receptor blockers (ARBs), nitrates, and calcium channel blockers.  Instructor discusses reasons, side effects, and lifestyle considerations for each drug class. Flowsheet Row CARDIAC REHAB PHASE II EXERCISE from 06/19/2016 in [facility] CARDIAC REHAB  Date  04/24/16  Instruction Review Code  2- meets goals/outcomes      Anatomy and Physiology of the  Circulatory System:  -Group instruction provided by verbal instruction, video, and written materials to support subject.  Reviews functional anatomy of heart, how it relates to various diagnoses, and what role the heart plays in the overall system. Flowsheet Row CARDIAC REHAB PHASE II EXERCISE from 06/19/2016 in [facility] CARDIAC REHAB  Date  06/19/16  Instruction Review Code  2- meets goals/outcomes      Knowledge Questionnaire Score:     Knowledge Questionnaire Score - 04/11/16 1239      Knowledge Questionnaire Score   Pre Score 6/24      Core Components/Risk Factors/Patient Goals at Admission:     Personal Goals and Risk Factors at Admission - 04/11/16 0924      Core Components/Risk Factors/Patient Goals on Admission    Weight Management Yes;Weight Loss   Intervention Weight Management: Develop a combined nutrition and exercise program  designed to reach desired caloric intake, while maintaining appropriate intake of nutrient and fiber, sodium and fats, and appropriate energy expenditure required for the weight goal.;Weight Management: Provide education and appropriate resources to help participant work on and attain dietary goals.;Weight Management/Obesity: Establish reasonable short term and long term weight goals.   Expected Outcomes Short Term: Continue to assess and modify interventions until short term weight is achieved;Long Term: Adherence to nutrition and physical activity/exercise program aimed toward attainment of established weight goal;Weight Maintenance: Understanding of the daily nutrition guidelines, which includes 25-35% calories from fat, 7% or less cal from saturated fats, less than 200mg  cholesterol, less than 1.5gm of sodium, & 5 or more servings of fruits and vegetables daily;Weight Loss: Understanding of general recommendations for a balanced deficit meal plan, which promotes 1-2 lb weight loss per week and includes a negative energy balance of [phone] kcal/d   Increase Strength and Stamina Yes   Intervention Develop an individualized exercise prescription for aerobic and resistive training based on initial evaluation findings, risk stratification, comorbidities and participant's personal goals.;Provide advice, education, support and counseling about physical activity/exercise needs.   Expected Outcomes Achievement of increased cardiorespiratory fitness and enhanced flexibility, muscular endurance and strength shown through measurements of functional capacity and personal statement of participant.   Improve shortness of breath with ADL's Yes   Intervention Provide education, individualized exercise plan and daily activity instruction to help decrease symptoms of SOB with activities of daily living.   Expected Outcomes Short Term: Achieves a reduction of symptoms when performing activities of daily living.   Lipids  Yes   Intervention Provide education and support for participant on nutrition & aerobic/resistive exercise along with prescribed medications to achieve LDL 70mg , HDL >40mg .   Expected Outcomes Short Term: Participant states understanding of desired cholesterol values and is compliant with medications prescribed. Participant is following exercise prescription and nutrition guidelines.;Long Term: Cholesterol controlled with medications as prescribed, with individualized exercise RX and with personalized nutrition plan. Value goals: LDL < 70mg , HDL > 40 mg.   Stress Yes   Intervention Offer individual and/or small group education and counseling on adjustment to heart disease, stress management and health-related lifestyle change. Teach and support self-help strategies.;Refer participants experiencing significant psychosocial distress to appropriate mental health specialists for further evaluation and treatment. When possible, include family members and significant others in education/counseling sessions.   Expected Outcomes Long Term: Emotional wellbeing is indicated by absence of clinically significant psychosocial distress or social isolation.;Short Term: Participant demonstrates changes in health-related behavior, relaxation and other stress management skills, ability to obtain effective social support, and compliance  with psychotropic medications if prescribed.   Personal Goal Other Yes   Personal Goal short - more energy, reduce SOB and lose 5lbs   Intervention Provide exercise education and guidelines to assist with energy levels and weightloss   Expected Outcomes Pt will be able to lose 5lbs and have increased energy      Core Components/Risk Factors/Patient Goals Review:      Goals and Risk Factor Review    Row Name 04/11/16 1418 05/24/16 1034 06/19/16 1508 06/21/16 1636       Core Components/Risk Factors/Patient Goals Review   Personal Goals Review Weight Management/Obesity;Lipids Improve  shortness of breath with ADL's;Other (P)  Other Weight Management/Obesity    Review  - SOB is improving, walking for 1x/week. Discussed adding 1-2 more days in addition to CRPII  - Pt is struggling to maintain weightloss. Discussed nutrition and meeting with Marily Memos and being compliant with HEP    Expected Outcomes  - Pt will be consistent with HEP and exercise 2x/week in addition to cardiac rehab  - Pt will be more consistent with HEP       Core Components/Risk Factors/Patient Goals at Discharge (Final Review):      Goals and Risk Factor Review - 06/21/16 1636      Core Components/Risk Factors/Patient Goals Review   Personal Goals Review Weight Management/Obesity   Review Pt is struggling to maintain weightloss. Discussed nutrition and meeting with Marily Memos and being compliant with HEP   Expected Outcomes Pt will be more consistent with HEP      ITP Comments:     ITP Comments    Row Name 04/11/16 0922 05/17/16 0807         ITP Comments Dr. Armanda Magic, Medical Director 05/17/16 Patient attended Hypertension education class. Meets goals/outcomes.         Comments:  Pt is making expected progress toward personal goals after completing  31 sessions. Recommend continued exercise and life style modification education including  stress management and relaxation techniques to decrease cardiac risk profile. Psychosocial Assessment remains unchanged from previous assessment. No needs identified. No further intervention warranted at this time. Alanson Aly, BSN

## 2016-06-28 ENCOUNTER — Encounter (HOSPITAL_COMMUNITY): Payer: Medicare Other

## 2016-07-01 ENCOUNTER — Encounter (HOSPITAL_COMMUNITY)
Admission: RE | Admit: 2016-07-01 | Discharge: 2016-07-01 | Disposition: A | Discharge status: Home / Self Care | Payer: Medicare Other | Source: Ambulatory Visit | Attending: Cardiovascular Disease | Admitting: Cardiovascular Disease

## 2016-07-01 DIAGNOSIS — Z955 Presence of coronary angioplasty implant and graft: Secondary | ICD-10-CM | POA: Diagnosis not present

## 2016-07-03 ENCOUNTER — Encounter (HOSPITAL_COMMUNITY)
Admission: RE | Admit: 2016-07-03 | Discharge: 2016-07-03 | Disposition: A | Discharge status: Home / Self Care | Payer: Medicare Other | Source: Ambulatory Visit | Attending: Cardiovascular Disease | Admitting: Cardiovascular Disease

## 2016-07-03 DIAGNOSIS — Z955 Presence of coronary angioplasty implant and graft: Secondary | ICD-10-CM | POA: Diagnosis not present

## 2016-07-05 ENCOUNTER — Encounter (HOSPITAL_COMMUNITY)
Admission: RE | Admit: 2016-07-05 | Discharge: 2016-07-05 | Disposition: A | Discharge status: Home / Self Care | Payer: Medicare Other | Source: Ambulatory Visit | Attending: Cardiovascular Disease | Admitting: Cardiovascular Disease

## 2016-07-05 DIAGNOSIS — Z955 Presence of coronary angioplasty implant and graft: Secondary | ICD-10-CM

## 2016-07-08 ENCOUNTER — Encounter (HOSPITAL_COMMUNITY)
Admission: RE | Admit: 2016-07-08 | Discharge: 2016-07-08 | Disposition: A | Discharge status: Home / Self Care | Payer: Medicare Other | Source: Ambulatory Visit | Attending: Cardiovascular Disease | Admitting: Cardiovascular Disease

## 2016-07-08 DIAGNOSIS — Z955 Presence of coronary angioplasty implant and graft: Secondary | ICD-10-CM | POA: Diagnosis not present

## 2016-07-10 ENCOUNTER — Encounter (HOSPITAL_COMMUNITY)
Admission: RE | Admit: 2016-07-10 | Discharge: 2016-07-10 | Disposition: A | Discharge status: Home / Self Care | Payer: Medicare Other | Source: Ambulatory Visit | Attending: Cardiovascular Disease | Admitting: Cardiovascular Disease

## 2016-07-10 DIAGNOSIS — Z955 Presence of coronary angioplasty implant and graft: Secondary | ICD-10-CM | POA: Diagnosis not present

## 2016-07-10 NOTE — Progress Notes (Signed)
Discharge Summary  Patient Details  Name: Travis Jacobs MRN: [id number] Date of Birth: [birth year] Referring Provider:   Flowsheet Row CARDIAC REHAB PHASE II ORIENTATION from 04/11/2016 in [place]  Referring Provider  Darlina Guys MD       Number of Visits: 36  Reason for Discharge:  Patient reached a stable level of exercise. Patient independent in their exercise.  Smoking History:  History  Smoking Status  . Former Smoker  . Packs/day: 1.00  . Years: 20.00  . Types: Cigarettes  Smokeless Tobacco  . Never Used    Comment: "quit smoking cigarettes in the 1980s"    Diagnosis:  No diagnosis found.  ADL UCSD:   Initial Exercise Prescription:     Initial Exercise Prescription - 04/11/16 1200      Date of Initial Exercise RX and Referring Provider   Date 04/11/16   Referring Provider Darlina Guys MD     Treadmill   MPH 2.8   Grade 1   Minutes 10   METs 3.53     Bike   Level 0.8   Minutes 10   METs 3     NuStep   Level 3   Minutes 10   METs 3     Prescription Details   Frequency (times per week) 3   Duration Progress to 30 minutes of continuous aerobic without signs/symptoms of physical distress     Intensity   THRR 40-80% of Max Heartrate 62-123   Ratings of Perceived Exertion 11-13   Perceived Dyspnea 0-4     Progression   Progression Continue to progress workloads to maintain intensity without signs/symptoms of physical distress.     Resistance Training   Training Prescription Yes   Weight 3lbs   Reps 10-12      Discharge Exercise Prescription (Final Exercise Prescription Changes):     Exercise Prescription Changes - 06/21/16 1600      Exercise Review   Progression Yes     Response to Exercise   Blood Pressure (Admit) 124/70   Blood Pressure (Exercise) 138/78  rchk BP 138/80   Blood Pressure (Exit) 104/62   Heart Rate (Admit) 62 bpm   Heart Rate (Exercise) 102 bpm   Heart Rate (Exit)  60 bpm   Rating of Perceived Exertion (Exercise) 13   Duration Progress to 30 minutes of continuous aerobic without signs/symptoms of physical distress   Intensity THRR unchanged     Progression   Progression Continue to progress workloads to maintain intensity without signs/symptoms of physical distress.   Average METs 5.4     Resistance Training   Training Prescription Yes   Weight 6lbs   Reps 10-12     Treadmill   MPH 3.3   Grade 6   Minutes 10   METs 5.8     Recumbant Bike   Level 3.8   Watts 45   Minutes 10   METs 3.9     NuStep   Level 6   Minutes 10   METs 5     Home Exercise Plan   Plans to continue exercise at Home  HEP was reviewed on 04/29/16 see progress note   Frequency Add 2 additional days to program exercise sessions.      Functional Capacity:     6 Minute Walk    Row Name 04/11/16 1240         6 Minute Walk   Phase Initial     Distance  1852 feet     Walk Time 6 minutes     # of Rest Breaks 0     MPH 3.5     METS 4.3     RPE 13     Perceived Dyspnea  2     VO2 Peak 14.9     Symptoms Yes (comment)     Comments mild to mod SOB     Resting HR 55 bpm     Resting BP 114/70     Max Ex. HR 88 bpm     Max Ex. BP 134/80     2 Minute Post BP 108/70        Psychological, QOL, Others - Outcomes: PHQ 2/9: Depression screen [facility] 2/9 07/10/2016 04/17/2016  Decreased Interest 0 0  Down, Depressed, Hopeless 0 0  PHQ - 2 Score 0 0    Quality of Life:     Quality of Life - 04/11/16 1250      Quality of Life Scores   Health/Function Pre 26.77 %   Socioeconomic Pre 30 %   Psych/Spiritual Pre 30 %   Family Pre 30 %   GLOBAL Pre 28.69 %      Personal Goals: Goals established at orientation with interventions provided to work toward goal.     Personal Goals and Risk Factors at Admission - 04/11/16 0924      Core Components/Risk Factors/Patient Goals on Admission    Weight Management Yes;Weight Loss   Intervention Weight Management:  Develop a combined nutrition and exercise program designed to reach desired caloric intake, while maintaining appropriate intake of nutrient and fiber, sodium and fats, and appropriate energy expenditure required for the weight goal.;Weight Management: Provide education and appropriate resources to help participant work on and attain dietary goals.;Weight Management/Obesity: Establish reasonable short term and long term weight goals.   Expected Outcomes Short Term: Continue to assess and modify interventions until short term weight is achieved;Long Term: Adherence to nutrition and physical activity/exercise program aimed toward attainment of established weight goal;Weight Maintenance: Understanding of the daily nutrition guidelines, which includes 25-35% calories from fat, 7% or less cal from saturated fats, less than 276m cholesterol, less than 1.5gm of sodium, & 5 or more servings of fruits and vegetables daily;Weight Loss: Understanding of general recommendations for a balanced deficit meal plan, which promotes 1-2 lb weight loss per week and includes a negative energy balance of [phone] kcal/d   Increase Strength and Stamina Yes   Intervention Develop an individualized exercise prescription for aerobic and resistive training based on initial evaluation findings, risk stratification, comorbidities and participant's personal goals.;Provide advice, education, support and counseling about physical activity/exercise needs.   Expected Outcomes Achievement of increased cardiorespiratory fitness and enhanced flexibility, muscular endurance and strength shown through measurements of functional capacity and personal statement of participant.   Improve shortness of breath with ADL's Yes   Intervention Provide education, individualized exercise plan and daily activity instruction to help decrease symptoms of SOB with activities of daily living.   Expected Outcomes Short Term: Achieves a reduction of symptoms when  performing activities of daily living.   Lipids Yes   Intervention Provide education and support for participant on nutrition & aerobic/resistive exercise along with prescribed medications to achieve LDL <735m HDL >4073m  Expected Outcomes Short Term: Participant states understanding of desired cholesterol values and is compliant with medications prescribed. Participant is following exercise prescription and nutrition guidelines.;Long Term: Cholesterol controlled with medications as prescribed, with individualized exercise RX  and with personalized nutrition plan. Value goals: LDL < 73m, HDL > 40 mg.   Stress Yes   Intervention Offer individual and/or small group education and counseling on adjustment to heart disease, stress management and health-related lifestyle change. Teach and support self-help strategies.;Refer participants experiencing significant psychosocial distress to appropriate mental health specialists for further evaluation and treatment. When possible, include family members and significant others in education/counseling sessions.   Expected Outcomes Long Term: Emotional wellbeing is indicated by absence of clinically significant psychosocial distress or social isolation.;Short Term: Participant demonstrates changes in health-related behavior, relaxation and other stress management skills, ability to obtain effective social support, and compliance with psychotropic medications if prescribed.   Personal Goal Other Yes   Personal Goal short - more energy, reduce SOB and lose 5lbs   Intervention Provide exercise education and guidelines to assist with energy levels and weightloss   Expected Outcomes Pt will be able to lose 5lbs and have increased energy       Personal Goals Discharge:     Goals and Risk Factor Review    Row Name 04/11/16 1418 05/24/16 1034 06/19/16 1508 06/21/16 1636       Core Components/Risk Factors/Patient Goals Review   Personal Goals Review Weight  Management/Obesity;Lipids Improve shortness of breath with ADL's;Other (P)  Other Weight Management/Obesity    Review  - SOB is improving, walking for 360m 1x/week. Discussed adding 1-2 more days in addition to CRPII  - Pt is struggling to maintain weightloss. Discussed nutrition and meeting with EdParke Simmersnd being compliant with HEP    Expected Outcomes  - Pt will be consistent with HEP and exercise 2x/week in addition to cardiac rehab  - Pt will be more consistent with HEP       Nutrition & Weight - Outcomes:     Pre Biometrics - 04/11/16 1249      Pre Biometrics   Waist Circumference 38.5 inches   Hip Circumference 40.5 inches   Waist to Hip Ratio 0.95 %   Triceps Skinfold 20 mm   % Body Fat 26.5 %   Grip Strength 46.5 kg   Flexibility 9 in   Single Leg Stand 16.09 seconds       Nutrition:     Nutrition Therapy & Goals - 05/10/16 0818      Nutrition Therapy   Diet Therapeutic Lifestyle Changes     Personal Nutrition Goals   Personal Goal #1 1-2 lb wt loss per week to pt's goal wt of 169 lb (5 lb decrease from admission wt)   Personal Goal #2 Knowledge re: basic diabetic diet principles     Intervention Plan   Intervention Prescribe, educate and counsel regarding individualized specific dietary modifications aiming towards targeted core components such as weight, hypertension, lipid management, diabetes, heart failure and other comorbidities.;Nutrition handout(s) given to patient.  Pre-diabetes handout given   Expected Outcomes Short Term Goal: Understand basic principles of dietary content, such as calories, fat, sodium, cholesterol and nutrients.;Long Term Goal: Adherence to prescribed nutrition plan.      Nutrition Discharge:     Nutrition Assessments - 04/22/16 1519      MEDFICTS Scores   Pre Score 12      Education Questionnaire Score:     Knowledge Questionnaire Score - 04/11/16 1239      Knowledge Questionnaire Score   Pre Score 6/24      Goals  reviewed with patient. Pt graduated from cardiac rehab program today with completion of 362  exercise sessions in Phase II. Pt maintained good attendance and progressed nicely during his participation in rehab as evidenced by increased MET level.   Medication list reconciled. Repeat  PHQ score-0.  Review of psychosocial assessment reveals no barriers , positive outlook with supportive family.  Pt has enjoyed participating in cardiac rehab.  Pt has made significant lifestyle changes and should be commended for his success. Review of post QOL survey results showed increase from previous survey completed. Pt feels he has achieved his goals during cardiac rehab. Pt has increased energy and reduction of shortness of breath. Pt did not lose the intended weight but has noticed reduction in inches and how his clothes feel.  Pt plans to continue exercise in cardiac maintenance program.  It was a delight to have this pt participate in cardiac rehab. Cherre Huger, BSN

## 2016-07-12 ENCOUNTER — Encounter (HOSPITAL_COMMUNITY): Payer: Medicare Other

## 2016-07-12 ENCOUNTER — Encounter (HOSPITAL_COMMUNITY)
Admission: RE | Admit: 2016-07-12 | Discharge: 2016-07-12 | Disposition: A | Discharge status: Home / Self Care | Payer: Self-pay | Source: Ambulatory Visit | Attending: Cardiovascular Disease | Admitting: Cardiovascular Disease

## 2016-07-12 DIAGNOSIS — I2510 Atherosclerotic heart disease of native coronary artery without angina pectoris: Secondary | ICD-10-CM | POA: Insufficient documentation

## 2016-07-12 DIAGNOSIS — K589 Irritable bowel syndrome without diarrhea: Secondary | ICD-10-CM | POA: Insufficient documentation

## 2016-07-12 DIAGNOSIS — E785 Hyperlipidemia, unspecified: Secondary | ICD-10-CM | POA: Insufficient documentation

## 2016-07-12 DIAGNOSIS — Z87891 Personal history of nicotine dependence: Secondary | ICD-10-CM | POA: Insufficient documentation

## 2016-07-12 DIAGNOSIS — K5730 Diverticulosis of large intestine without perforation or abscess without bleeding: Secondary | ICD-10-CM | POA: Insufficient documentation

## 2016-07-12 DIAGNOSIS — Z955 Presence of coronary angioplasty implant and graft: Secondary | ICD-10-CM | POA: Insufficient documentation

## 2016-07-12 DIAGNOSIS — K219 Gastro-esophageal reflux disease without esophagitis: Secondary | ICD-10-CM | POA: Insufficient documentation

## 2016-07-15 ENCOUNTER — Encounter (HOSPITAL_COMMUNITY): Payer: Medicare Other

## 2016-07-15 ENCOUNTER — Encounter (HOSPITAL_COMMUNITY)
Admission: RE | Admit: 2016-07-15 | Discharge: 2016-07-15 | Disposition: A | Discharge status: Home / Self Care | Payer: Self-pay | Source: Ambulatory Visit | Attending: Cardiovascular Disease | Admitting: Cardiovascular Disease

## 2016-07-15 DIAGNOSIS — K5730 Diverticulosis of large intestine without perforation or abscess without bleeding: Secondary | ICD-10-CM | POA: Insufficient documentation

## 2016-07-15 DIAGNOSIS — K589 Irritable bowel syndrome without diarrhea: Secondary | ICD-10-CM | POA: Insufficient documentation

## 2016-07-15 DIAGNOSIS — K219 Gastro-esophageal reflux disease without esophagitis: Secondary | ICD-10-CM | POA: Insufficient documentation

## 2016-07-15 DIAGNOSIS — Z955 Presence of coronary angioplasty implant and graft: Secondary | ICD-10-CM | POA: Insufficient documentation

## 2016-07-15 DIAGNOSIS — I2510 Atherosclerotic heart disease of native coronary artery without angina pectoris: Secondary | ICD-10-CM | POA: Insufficient documentation

## 2016-07-15 DIAGNOSIS — E785 Hyperlipidemia, unspecified: Secondary | ICD-10-CM | POA: Insufficient documentation

## 2016-07-15 DIAGNOSIS — Z87891 Personal history of nicotine dependence: Secondary | ICD-10-CM | POA: Insufficient documentation

## 2016-07-17 ENCOUNTER — Encounter (HOSPITAL_COMMUNITY): Payer: Medicare Other

## 2016-07-17 ENCOUNTER — Encounter (HOSPITAL_COMMUNITY)
Admission: RE | Admit: 2016-07-17 | Discharge: 2016-07-17 | Disposition: A | Discharge status: Home / Self Care | Payer: Self-pay | Source: Ambulatory Visit | Attending: Cardiovascular Disease | Admitting: Cardiovascular Disease

## 2016-07-19 ENCOUNTER — Encounter (HOSPITAL_COMMUNITY): Payer: Medicare Other

## 2016-07-19 ENCOUNTER — Encounter (HOSPITAL_COMMUNITY)
Admission: RE | Admit: 2016-07-19 | Discharge: 2016-07-19 | Disposition: A | Discharge status: Home / Self Care | Payer: Self-pay | Source: Ambulatory Visit | Attending: Cardiovascular Disease | Admitting: Cardiovascular Disease

## 2016-07-22 ENCOUNTER — Encounter (HOSPITAL_COMMUNITY): Payer: Medicare Other

## 2016-07-22 ENCOUNTER — Encounter (HOSPITAL_COMMUNITY)
Admission: RE | Admit: 2016-07-22 | Discharge: 2016-07-22 | Disposition: A | Discharge status: Home / Self Care | Payer: Self-pay | Source: Ambulatory Visit | Attending: Cardiovascular Disease | Admitting: Cardiovascular Disease

## 2016-07-24 ENCOUNTER — Encounter (HOSPITAL_COMMUNITY): Payer: Medicare Other

## 2016-07-24 ENCOUNTER — Encounter (HOSPITAL_COMMUNITY): Payer: Self-pay

## 2016-07-26 ENCOUNTER — Encounter (HOSPITAL_COMMUNITY)
Admission: RE | Admit: 2016-07-26 | Discharge: 2016-07-26 | Disposition: A | Discharge status: Home / Self Care | Payer: Self-pay | Source: Ambulatory Visit | Attending: Cardiovascular Disease | Admitting: Cardiovascular Disease

## 2016-07-26 ENCOUNTER — Encounter (HOSPITAL_COMMUNITY): Payer: Medicare Other

## 2016-07-29 ENCOUNTER — Encounter (HOSPITAL_COMMUNITY): Payer: Self-pay

## 2016-07-29 ENCOUNTER — Encounter (HOSPITAL_COMMUNITY): Payer: Medicare Other

## 2016-07-31 ENCOUNTER — Encounter (HOSPITAL_COMMUNITY): Payer: Medicare Other

## 2016-07-31 ENCOUNTER — Encounter (HOSPITAL_COMMUNITY)
Admission: RE | Admit: 2016-07-31 | Discharge: 2016-07-31 | Disposition: A | Discharge status: Home / Self Care | Payer: Self-pay | Source: Ambulatory Visit | Attending: Cardiovascular Disease | Admitting: Cardiovascular Disease

## 2016-08-02 ENCOUNTER — Encounter (HOSPITAL_COMMUNITY)
Admission: RE | Admit: 2016-08-02 | Discharge: 2016-08-02 | Disposition: A | Discharge status: Home / Self Care | Payer: Self-pay | Source: Ambulatory Visit | Attending: Cardiovascular Disease | Admitting: Cardiovascular Disease

## 2016-08-02 ENCOUNTER — Encounter (HOSPITAL_COMMUNITY): Payer: Medicare Other

## 2016-08-05 ENCOUNTER — Encounter (HOSPITAL_COMMUNITY)
Admission: RE | Admit: 2016-08-05 | Discharge: 2016-08-05 | Disposition: A | Discharge status: Home / Self Care | Payer: Self-pay | Source: Ambulatory Visit | Attending: Cardiovascular Disease | Admitting: Cardiovascular Disease

## 2016-08-05 ENCOUNTER — Encounter (HOSPITAL_COMMUNITY): Payer: Medicare Other

## 2016-08-07 ENCOUNTER — Encounter (HOSPITAL_COMMUNITY): Payer: Self-pay

## 2016-08-07 ENCOUNTER — Encounter (HOSPITAL_COMMUNITY): Payer: Medicare Other

## 2016-08-09 ENCOUNTER — Encounter (HOSPITAL_COMMUNITY): Payer: Medicare Other

## 2016-08-09 ENCOUNTER — Encounter (HOSPITAL_COMMUNITY)
Admission: RE | Admit: 2016-08-09 | Discharge: 2016-08-09 | Disposition: A | Discharge status: Home / Self Care | Payer: Self-pay | Source: Ambulatory Visit | Attending: Cardiovascular Disease | Admitting: Cardiovascular Disease

## 2016-08-12 ENCOUNTER — Encounter (HOSPITAL_COMMUNITY)
Admission: RE | Admit: 2016-08-12 | Discharge: 2016-08-12 | Disposition: A | Discharge status: Home / Self Care | Payer: Self-pay | Source: Ambulatory Visit | Attending: Cardiovascular Disease | Admitting: Cardiovascular Disease

## 2016-08-12 ENCOUNTER — Encounter (HOSPITAL_COMMUNITY): Payer: Medicare Other

## 2016-08-14 ENCOUNTER — Encounter (HOSPITAL_COMMUNITY): Payer: Medicare Other

## 2016-08-14 ENCOUNTER — Encounter (HOSPITAL_COMMUNITY)
Admission: RE | Admit: 2016-08-14 | Discharge: 2016-08-14 | Disposition: A | Discharge status: Home / Self Care | Payer: Self-pay | Source: Ambulatory Visit | Attending: Cardiovascular Disease | Admitting: Cardiovascular Disease

## 2016-08-14 DIAGNOSIS — K219 Gastro-esophageal reflux disease without esophagitis: Secondary | ICD-10-CM | POA: Insufficient documentation

## 2016-08-14 DIAGNOSIS — E785 Hyperlipidemia, unspecified: Secondary | ICD-10-CM | POA: Insufficient documentation

## 2016-08-14 DIAGNOSIS — K5730 Diverticulosis of large intestine without perforation or abscess without bleeding: Secondary | ICD-10-CM | POA: Insufficient documentation

## 2016-08-14 DIAGNOSIS — I2510 Atherosclerotic heart disease of native coronary artery without angina pectoris: Secondary | ICD-10-CM | POA: Insufficient documentation

## 2016-08-14 DIAGNOSIS — Z87891 Personal history of nicotine dependence: Secondary | ICD-10-CM | POA: Insufficient documentation

## 2016-08-14 DIAGNOSIS — Z955 Presence of coronary angioplasty implant and graft: Secondary | ICD-10-CM | POA: Insufficient documentation

## 2016-08-14 DIAGNOSIS — K589 Irritable bowel syndrome without diarrhea: Secondary | ICD-10-CM | POA: Insufficient documentation

## 2016-08-16 ENCOUNTER — Encounter (HOSPITAL_COMMUNITY): Payer: Medicare Other

## 2016-08-16 ENCOUNTER — Encounter (HOSPITAL_COMMUNITY)
Admission: RE | Admit: 2016-08-16 | Discharge: 2016-08-16 | Disposition: A | Discharge status: Home / Self Care | Payer: Self-pay | Source: Ambulatory Visit | Attending: Cardiovascular Disease | Admitting: Cardiovascular Disease

## 2016-08-19 ENCOUNTER — Encounter (HOSPITAL_COMMUNITY)
Admission: RE | Admit: 2016-08-19 | Discharge: 2016-08-19 | Disposition: A | Discharge status: Home / Self Care | Payer: Self-pay | Source: Ambulatory Visit | Attending: Cardiovascular Disease | Admitting: Cardiovascular Disease

## 2016-08-21 ENCOUNTER — Encounter (HOSPITAL_COMMUNITY)
Admission: RE | Admit: 2016-08-21 | Discharge: 2016-08-21 | Disposition: A | Discharge status: Home / Self Care | Payer: Self-pay | Source: Ambulatory Visit | Attending: Cardiovascular Disease | Admitting: Cardiovascular Disease

## 2016-08-23 ENCOUNTER — Encounter (HOSPITAL_COMMUNITY)
Admission: RE | Admit: 2016-08-23 | Discharge: 2016-08-23 | Disposition: A | Discharge status: Home / Self Care | Payer: Self-pay | Source: Ambulatory Visit | Attending: Cardiovascular Disease | Admitting: Cardiovascular Disease

## 2016-08-26 ENCOUNTER — Encounter (HOSPITAL_COMMUNITY)
Admission: RE | Admit: 2016-08-26 | Discharge: 2016-08-26 | Disposition: A | Discharge status: Home / Self Care | Payer: Self-pay | Source: Ambulatory Visit | Attending: Cardiovascular Disease | Admitting: Cardiovascular Disease

## 2016-08-27 ENCOUNTER — Encounter: Payer: Self-pay | Admitting: Physician Assistant

## 2016-08-27 ENCOUNTER — Other Ambulatory Visit: Payer: Self-pay | Admitting: Cardiology

## 2016-08-28 ENCOUNTER — Other Ambulatory Visit: Payer: Self-pay | Admitting: *Deleted

## 2016-08-28 ENCOUNTER — Encounter (HOSPITAL_COMMUNITY)
Admission: RE | Admit: 2016-08-28 | Discharge: 2016-08-28 | Disposition: A | Discharge status: Home / Self Care | Payer: Self-pay | Source: Ambulatory Visit | Attending: Cardiovascular Disease | Admitting: Cardiovascular Disease

## 2016-08-28 MED ORDER — CLOPIDOGREL BISULFATE 75 MG PO TABS: 75.0000 mg | ORAL_TABLET | Freq: Every day | ORAL | 3 refills | Status: DC

## 2016-08-28 MED ORDER — PANTOPRAZOLE SODIUM 40 MG PO TBEC
40.0000 mg | DELAYED_RELEASE_TABLET | Freq: Every day | ORAL | 3 refills | Status: DC
Start: 2016-08-28 — End: 2017-08-21

## 2016-08-28 MED ORDER — METOPROLOL TARTRATE 25 MG PO TABS: 25.0000 mg | ORAL_TABLET | Freq: Two times a day (BID) | ORAL | 3 refills | Status: DC

## 2016-08-30 ENCOUNTER — Encounter (HOSPITAL_COMMUNITY): Payer: Self-pay

## 2016-09-02 ENCOUNTER — Encounter (HOSPITAL_COMMUNITY)
Admission: RE | Admit: 2016-09-02 | Discharge: 2016-09-02 | Disposition: A | Discharge status: Home / Self Care | Payer: Self-pay | Source: Ambulatory Visit | Attending: Cardiovascular Disease | Admitting: Cardiovascular Disease

## 2016-09-04 ENCOUNTER — Encounter (HOSPITAL_COMMUNITY)
Admission: RE | Admit: 2016-09-04 | Discharge: 2016-09-04 | Disposition: A | Discharge status: Home / Self Care | Payer: Self-pay | Source: Ambulatory Visit | Attending: Cardiovascular Disease | Admitting: Cardiovascular Disease

## 2016-09-09 ENCOUNTER — Encounter (HOSPITAL_COMMUNITY)
Admission: RE | Admit: 2016-09-09 | Discharge: 2016-09-09 | Disposition: A | Discharge status: Home / Self Care | Payer: Self-pay | Source: Ambulatory Visit | Attending: Cardiovascular Disease | Admitting: Cardiovascular Disease

## 2016-09-11 ENCOUNTER — Encounter (HOSPITAL_COMMUNITY)
Admission: RE | Admit: 2016-09-11 | Discharge: 2016-09-11 | Disposition: A | Discharge status: Home / Self Care | Payer: Self-pay | Source: Ambulatory Visit | Attending: Cardiovascular Disease | Admitting: Cardiovascular Disease

## 2016-09-13 ENCOUNTER — Encounter (HOSPITAL_COMMUNITY): Payer: Self-pay

## 2016-09-13 DIAGNOSIS — E785 Hyperlipidemia, unspecified: Secondary | ICD-10-CM | POA: Insufficient documentation

## 2016-09-13 DIAGNOSIS — Z87891 Personal history of nicotine dependence: Secondary | ICD-10-CM | POA: Insufficient documentation

## 2016-09-13 DIAGNOSIS — Z955 Presence of coronary angioplasty implant and graft: Secondary | ICD-10-CM | POA: Insufficient documentation

## 2016-09-13 DIAGNOSIS — I2510 Atherosclerotic heart disease of native coronary artery without angina pectoris: Secondary | ICD-10-CM | POA: Insufficient documentation

## 2016-09-13 DIAGNOSIS — K5730 Diverticulosis of large intestine without perforation or abscess without bleeding: Secondary | ICD-10-CM | POA: Insufficient documentation

## 2016-09-13 DIAGNOSIS — K219 Gastro-esophageal reflux disease without esophagitis: Secondary | ICD-10-CM | POA: Insufficient documentation

## 2016-09-13 DIAGNOSIS — K589 Irritable bowel syndrome without diarrhea: Secondary | ICD-10-CM | POA: Insufficient documentation

## 2016-09-16 ENCOUNTER — Encounter (HOSPITAL_COMMUNITY)
Admission: RE | Admit: 2016-09-16 | Discharge: 2016-09-16 | Disposition: A | Discharge status: Home / Self Care | Payer: Self-pay | Source: Ambulatory Visit | Attending: Cardiovascular Disease | Admitting: Cardiovascular Disease

## 2016-09-18 ENCOUNTER — Encounter (HOSPITAL_COMMUNITY): Payer: Self-pay

## 2016-09-20 ENCOUNTER — Encounter (HOSPITAL_COMMUNITY)
Admission: RE | Admit: 2016-09-20 | Discharge: 2016-09-20 | Disposition: A | Discharge status: Home / Self Care | Payer: Self-pay | Source: Ambulatory Visit | Attending: Cardiovascular Disease | Admitting: Cardiovascular Disease

## 2016-09-23 ENCOUNTER — Encounter (HOSPITAL_COMMUNITY)
Admission: RE | Admit: 2016-09-23 | Discharge: 2016-09-23 | Disposition: A | Discharge status: Home / Self Care | Payer: Self-pay | Source: Ambulatory Visit | Attending: Cardiovascular Disease | Admitting: Cardiovascular Disease

## 2016-09-25 ENCOUNTER — Encounter (HOSPITAL_COMMUNITY)
Admission: RE | Admit: 2016-09-25 | Discharge: 2016-09-25 | Disposition: A | Discharge status: Home / Self Care | Payer: Self-pay | Source: Ambulatory Visit | Attending: Cardiovascular Disease | Admitting: Cardiovascular Disease

## 2016-09-27 ENCOUNTER — Encounter (HOSPITAL_COMMUNITY): Payer: Self-pay

## 2016-09-30 ENCOUNTER — Encounter (HOSPITAL_COMMUNITY)
Admission: RE | Admit: 2016-09-30 | Discharge: 2016-09-30 | Disposition: A | Discharge status: Home / Self Care | Payer: Self-pay | Source: Ambulatory Visit | Attending: Cardiovascular Disease | Admitting: Cardiovascular Disease

## 2016-10-02 ENCOUNTER — Encounter (HOSPITAL_COMMUNITY): Payer: Self-pay

## 2016-10-04 ENCOUNTER — Encounter (HOSPITAL_COMMUNITY)
Admission: RE | Admit: 2016-10-04 | Discharge: 2016-10-04 | Disposition: A | Discharge status: Home / Self Care | Payer: Self-pay | Source: Ambulatory Visit | Attending: Cardiovascular Disease | Admitting: Cardiovascular Disease

## 2016-10-09 ENCOUNTER — Encounter (HOSPITAL_COMMUNITY): Payer: Self-pay

## 2016-10-11 ENCOUNTER — Encounter (HOSPITAL_COMMUNITY)
Admission: RE | Admit: 2016-10-11 | Discharge: 2016-10-11 | Disposition: A | Discharge status: Home / Self Care | Payer: Self-pay | Source: Ambulatory Visit | Attending: Cardiovascular Disease | Admitting: Cardiovascular Disease

## 2016-10-16 ENCOUNTER — Encounter (HOSPITAL_COMMUNITY)
Admission: RE | Admit: 2016-10-16 | Discharge: 2016-10-16 | Disposition: A | Discharge status: Home / Self Care | Payer: Self-pay | Source: Ambulatory Visit | Attending: Cardiovascular Disease | Admitting: Cardiovascular Disease

## 2016-10-16 DIAGNOSIS — Z955 Presence of coronary angioplasty implant and graft: Secondary | ICD-10-CM | POA: Insufficient documentation

## 2016-10-16 DIAGNOSIS — I2510 Atherosclerotic heart disease of native coronary artery without angina pectoris: Secondary | ICD-10-CM | POA: Insufficient documentation

## 2016-10-18 ENCOUNTER — Encounter (HOSPITAL_COMMUNITY)
Admission: RE | Admit: 2016-10-18 | Discharge: 2016-10-18 | Disposition: A | Discharge status: Home / Self Care | Payer: Self-pay | Source: Ambulatory Visit | Attending: Cardiovascular Disease | Admitting: Cardiovascular Disease

## 2016-10-21 ENCOUNTER — Encounter (HOSPITAL_COMMUNITY)
Admission: RE | Admit: 2016-10-21 | Discharge: 2016-10-21 | Disposition: A | Discharge status: Home / Self Care | Payer: Self-pay | Source: Ambulatory Visit | Attending: Cardiovascular Disease | Admitting: Cardiovascular Disease

## 2016-10-23 ENCOUNTER — Encounter (HOSPITAL_COMMUNITY)
Admission: RE | Admit: 2016-10-23 | Discharge: 2016-10-23 | Disposition: A | Discharge status: Home / Self Care | Payer: Self-pay | Source: Ambulatory Visit | Attending: Cardiovascular Disease | Admitting: Cardiovascular Disease

## 2016-10-25 ENCOUNTER — Encounter (HOSPITAL_COMMUNITY)
Admission: RE | Admit: 2016-10-25 | Discharge: 2016-10-25 | Disposition: A | Discharge status: Home / Self Care | Payer: Self-pay | Source: Ambulatory Visit | Attending: Cardiovascular Disease | Admitting: Cardiovascular Disease

## 2016-10-28 ENCOUNTER — Encounter (HOSPITAL_COMMUNITY): Payer: Self-pay

## 2016-10-30 ENCOUNTER — Encounter (HOSPITAL_COMMUNITY): Payer: Self-pay

## 2016-11-01 ENCOUNTER — Encounter (HOSPITAL_COMMUNITY): Payer: Self-pay

## 2016-11-04 ENCOUNTER — Encounter (HOSPITAL_COMMUNITY): Payer: Self-pay

## 2016-11-06 ENCOUNTER — Encounter (HOSPITAL_COMMUNITY): Payer: Self-pay

## 2016-11-08 ENCOUNTER — Encounter (HOSPITAL_COMMUNITY): Payer: Self-pay

## 2016-11-11 ENCOUNTER — Encounter (HOSPITAL_COMMUNITY): Payer: Self-pay

## 2016-11-13 ENCOUNTER — Encounter (HOSPITAL_COMMUNITY): Payer: Self-pay

## 2016-11-15 ENCOUNTER — Encounter (HOSPITAL_COMMUNITY)
Admission: RE | Admit: 2016-11-15 | Discharge: 2016-11-15 | Disposition: A | Discharge status: Home / Self Care | Payer: Self-pay | Source: Ambulatory Visit | Attending: Cardiovascular Disease | Admitting: Cardiovascular Disease

## 2016-11-15 DIAGNOSIS — Z955 Presence of coronary angioplasty implant and graft: Secondary | ICD-10-CM | POA: Insufficient documentation

## 2016-11-15 DIAGNOSIS — I2510 Atherosclerotic heart disease of native coronary artery without angina pectoris: Secondary | ICD-10-CM | POA: Insufficient documentation

## 2016-11-18 ENCOUNTER — Encounter (HOSPITAL_COMMUNITY)
Admission: RE | Admit: 2016-11-18 | Discharge: 2016-11-18 | Disposition: A | Discharge status: Home / Self Care | Payer: Self-pay | Source: Ambulatory Visit | Attending: Cardiovascular Disease | Admitting: Cardiovascular Disease

## 2016-11-20 ENCOUNTER — Encounter (HOSPITAL_COMMUNITY)
Admission: RE | Admit: 2016-11-20 | Discharge: 2016-11-20 | Disposition: A | Discharge status: Home / Self Care | Payer: Self-pay | Source: Ambulatory Visit | Attending: Cardiovascular Disease | Admitting: Cardiovascular Disease

## 2016-11-22 ENCOUNTER — Encounter (HOSPITAL_COMMUNITY)
Admission: RE | Admit: 2016-11-22 | Discharge: 2016-11-22 | Disposition: A | Discharge status: Home / Self Care | Payer: Self-pay | Source: Ambulatory Visit | Attending: Cardiovascular Disease | Admitting: Cardiovascular Disease

## 2016-11-25 ENCOUNTER — Encounter (HOSPITAL_COMMUNITY)
Admission: RE | Admit: 2016-11-25 | Discharge: 2016-11-25 | Disposition: A | Discharge status: Home / Self Care | Payer: Self-pay | Source: Ambulatory Visit | Attending: Cardiovascular Disease | Admitting: Cardiovascular Disease

## 2016-11-27 ENCOUNTER — Encounter (HOSPITAL_COMMUNITY)
Admission: RE | Admit: 2016-11-27 | Discharge: 2016-11-27 | Disposition: A | Discharge status: Home / Self Care | Payer: Self-pay | Source: Ambulatory Visit | Attending: Cardiovascular Disease | Admitting: Cardiovascular Disease

## 2016-11-29 ENCOUNTER — Encounter (HOSPITAL_COMMUNITY)
Admission: RE | Admit: 2016-11-29 | Discharge: 2016-11-29 | Disposition: A | Discharge status: Home / Self Care | Payer: Self-pay | Source: Ambulatory Visit | Attending: Cardiovascular Disease | Admitting: Cardiovascular Disease

## 2016-12-02 ENCOUNTER — Encounter (HOSPITAL_COMMUNITY)
Admission: RE | Admit: 2016-12-02 | Discharge: 2016-12-02 | Disposition: A | Discharge status: Home / Self Care | Payer: Self-pay | Source: Ambulatory Visit | Attending: Cardiovascular Disease | Admitting: Cardiovascular Disease

## 2016-12-04 ENCOUNTER — Encounter (HOSPITAL_COMMUNITY)
Admission: RE | Admit: 2016-12-04 | Discharge: 2016-12-04 | Disposition: A | Discharge status: Home / Self Care | Payer: Self-pay | Source: Ambulatory Visit | Attending: Cardiovascular Disease | Admitting: Cardiovascular Disease

## 2016-12-06 ENCOUNTER — Encounter (HOSPITAL_COMMUNITY): Payer: Self-pay

## 2016-12-09 ENCOUNTER — Encounter (HOSPITAL_COMMUNITY): Payer: Self-pay

## 2016-12-11 ENCOUNTER — Encounter (HOSPITAL_COMMUNITY): Payer: Self-pay

## 2016-12-13 ENCOUNTER — Encounter (HOSPITAL_COMMUNITY)
Admission: RE | Admit: 2016-12-13 | Discharge: 2016-12-13 | Disposition: A | Discharge status: Home / Self Care | Payer: Self-pay | Source: Ambulatory Visit | Attending: Cardiovascular Disease | Admitting: Cardiovascular Disease

## 2016-12-13 DIAGNOSIS — Z955 Presence of coronary angioplasty implant and graft: Secondary | ICD-10-CM | POA: Insufficient documentation

## 2016-12-13 DIAGNOSIS — I2510 Atherosclerotic heart disease of native coronary artery without angina pectoris: Secondary | ICD-10-CM | POA: Insufficient documentation

## 2016-12-16 ENCOUNTER — Encounter (HOSPITAL_COMMUNITY)
Admission: RE | Admit: 2016-12-16 | Discharge: 2016-12-16 | Disposition: A | Discharge status: Home / Self Care | Payer: Self-pay | Source: Ambulatory Visit | Attending: Cardiovascular Disease | Admitting: Cardiovascular Disease

## 2016-12-18 ENCOUNTER — Encounter (HOSPITAL_COMMUNITY)
Admission: RE | Admit: 2016-12-18 | Discharge: 2016-12-18 | Disposition: A | Discharge status: Home / Self Care | Payer: Self-pay | Source: Ambulatory Visit | Attending: Cardiovascular Disease | Admitting: Cardiovascular Disease

## 2016-12-20 ENCOUNTER — Encounter: Payer: Self-pay | Admitting: Cardiovascular Disease

## 2016-12-20 ENCOUNTER — Encounter (HOSPITAL_COMMUNITY): Payer: Self-pay

## 2016-12-23 ENCOUNTER — Encounter (HOSPITAL_COMMUNITY)
Admission: RE | Admit: 2016-12-23 | Discharge: 2016-12-23 | Disposition: A | Discharge status: Home / Self Care | Payer: Self-pay | Source: Ambulatory Visit | Attending: Cardiovascular Disease | Admitting: Cardiovascular Disease

## 2016-12-25 ENCOUNTER — Encounter (HOSPITAL_COMMUNITY): Payer: Self-pay

## 2016-12-27 ENCOUNTER — Encounter: Payer: Self-pay | Admitting: Cardiovascular Disease

## 2016-12-27 ENCOUNTER — Encounter (HOSPITAL_COMMUNITY)
Admission: RE | Admit: 2016-12-27 | Discharge: 2016-12-27 | Disposition: A | Discharge status: Home / Self Care | Payer: Self-pay | Source: Ambulatory Visit | Attending: Cardiovascular Disease | Admitting: Cardiovascular Disease

## 2016-12-27 ENCOUNTER — Ambulatory Visit (INDEPENDENT_AMBULATORY_CARE_PROVIDER_SITE_OTHER): Payer: Medicare Other | Admitting: Cardiovascular Disease

## 2016-12-27 VITALS — BP 122/68 | HR 58 | Ht 71.0 in | Wt 169.0 lb

## 2016-12-27 DIAGNOSIS — I493 Ventricular premature depolarization: Secondary | ICD-10-CM | POA: Diagnosis not present

## 2016-12-27 DIAGNOSIS — E7800 Pure hypercholesterolemia, unspecified: Secondary | ICD-10-CM

## 2016-12-27 DIAGNOSIS — I2510 Atherosclerotic heart disease of native coronary artery without angina pectoris: Secondary | ICD-10-CM | POA: Diagnosis not present

## 2016-12-27 NOTE — Progress Notes (Signed)
Chief Complaint  Patient presents with  . Follow-up    some SHOB. NO other complaints.     History of Present Illness: 66 yo male with history of CAD, PVCs, HLD, GERD who is here today for cardiac follow up. He had a cardiac cath in May 2017 in the setting of unstable angina which showed a severe stenosis in the first obtuse marginal branch. A drug eluting stent was placed. There was mild disease in the RCA and LAD. LV function was normal.  Frequent PVCs noted during his hospital stay. Cardiac monitor June 2017 with frequent PVCS and periods of sinus bradycardia. He was seen in the EP clinic by Dr. Graciela Husbands and he recommended the patient continue his beta blocker.   He is here today for follow up. He has no chest pain or dyspnea. He denies palpitations, near syncope or syncope.   Primary Care Physician: Enrique Sack, MD   Past Medical History:  Diagnosis Date  . CAD (coronary artery disease)    a. DES to OM1 02/2016.   . Cataract    bil cateracts  . Diverticulosis   . Esophageal stricture   . GERD (gastroesophageal reflux disease)   . Hemorrhoids   . Hiatal hernia   . Hyperlipidemia   . IBS (irritable bowel syndrome)   . Multiple food allergies    "lots of food allergies; beef, tuna, flounder, potatoes, garlic, tomatoes, apples, bananas, sesame seeds, shellfish  . Pre-diabetes   . PVC's (premature ventricular contractions)     Past Surgical History:  Procedure Laterality Date  . CARDIAC CATHETERIZATION N/A 02/28/2016   Procedure: Left Heart Cath and Coronary Angiography;  Surgeon: Iran Ouch, MD;  Location: [facility] INVASIVE CV [facility];  Service: Cardiovascular;  Laterality: N/A;  . CARDIAC CATHETERIZATION N/A 02/28/2016   Procedure: Coronary Stent Intervention;  Surgeon: Iran Ouch, MD;  Location: [facility] INVASIVE CV [facility];  Service: Cardiovascular;  Laterality: N/A;  . COLONOSCOPY    . CYSTOSCOPY WITH STENT PLACEMENT    . ESOPHAGOGASTRODUODENOSCOPY (EGD) WITH ESOPHAGEAL  DILATION    . HEMORRHOID BANDING    . HERNIA REPAIR    . SURGERY SCROTAL / TESTICULAR Left ~ 1960   "hernia"  . TONSILLECTOMY    . UPPER GASTROINTESTINAL ENDOSCOPY      Current Outpatient Prescriptions  Medication Sig Dispense Refill  . aspirin EC 81 MG EC tablet Take 1 tablet (81 mg total) by mouth daily.    Marland Kitchen atorvastatin (LIPITOR) 40 MG tablet Take 1 tablet (40 mg total) by mouth daily. 30 tablet 5  . CIALIS 5 MG tablet Take 5 mg by mouth daily as needed for erectile dysfunction.   98  . clopidogrel (PLAVIX) 75 MG tablet Take 1 tablet (75 mg total) by mouth daily with breakfast. 90 tablet 3  . docusate sodium (COLACE) 100 MG capsule Take 500 mg by mouth daily.    . halobetasol (ULTRAVATE) 0.05 % cream Apply 1 application topically daily.     . Magnesium 250 MG TABS Take 250 mg by mouth daily.    . metoprolol tartrate (LOPRESSOR) 25 MG tablet Take 1 tablet (25 mg total) by mouth 2 (two) times daily. 180 tablet 3  . nitroGLYCERIN (NITROSTAT) 0.4 MG SL tablet Place 1 tablet (0.4 mg total) under the tongue every 5 (five) minutes x 3 doses as needed for chest pain. 25 tablet 2  . pantoprazole (PROTONIX) 40 MG tablet Take 1 tablet (40 mg total) by mouth daily. 90  tablet 3  . Probiotic Product (PROBIOTIC DAILY PO) Take 1 capsule by mouth daily.     Marland Kitchen. triamcinolone (NASACORT ALLERGY 24HR) 55 MCG/ACT AERO nasal inhaler Place 2 sprays into the nose daily as needed (allergies).      No current facility-administered medications for this visit.     Allergies  Allergen Reactions  . Aleve [Naproxen Sodium]     Doesn't remember  . Apple Swelling  . Banana Nausea And Vomiting  . Beef-Derived Products Nausea And Vomiting  . Erythromycin Swelling    Mouth swells  . Fish Allergy Swelling  . Garlic Swelling  . Sesame Seed (Diagnostic) Swelling  . Shellfish Allergy Other (See Comments)    Not sure of reaction found out about allergy through [place]  . Sulfonamide Derivatives Swelling    Mouth  swelling  . Tomato Nausea And Vomiting  . Iodine Swelling    "I blow up like a balloon"  . Strawberry (Diagnostic) Rash    Social History   Social History  . Marital status: Married    Spouse name: N/A  . Number of children: N/A  . Years of education: N/A   Occupational History  . Not on file.   Social History Main Topics  . Smoking status: Former Smoker    Packs/day: 1.00    Years: 20.00    Types: Cigarettes  . Smokeless tobacco: Never Used     Comment: "quit smoking cigarettes in the 1980s"  . Alcohol use 0.0 oz/week     Comment: 02/27/2016 "nothing in the last few weeks; no regular pattern"  . Drug use: No  . Sexual activity: Yes   Other Topics Concern  . Not on file   Social History Narrative  . No narrative on file    Family History  Problem Relation Age of Onset  . Heart disease Mother   . Heart disease Father   . Colon cancer Neg Hx   . Esophageal cancer Neg Hx   . Rectal cancer Neg Hx   . Stomach cancer Neg Hx     Review of Systems:  As stated in the HPI and otherwise negative.   BP 122/68   Pulse (!) 58   Ht 5\' 11"  (1.803 m)   Wt 169 lb (76.7 kg)   BMI 23.57 kg/m   Physical Examination: General: Well developed, well nourished, NAD  HEENT: OP clear, mucus membranes moist  SKIN: warm, dry. No rashes. Neuro: No focal deficits  Musculoskeletal: Muscle strength 5/5 all ext  Psychiatric: Mood and affect normal  Neck: No JVD, no carotid bruits, no thyromegaly, no lymphadenopathy.  Lungs:Clear bilaterally, no wheezes, rhonci, crackles Cardiovascular: Regular rate and rhythm. No murmurs, gallops or rubs. Abdomen:Soft. Bowel sounds present. Non-tender.  Extremities: No lower extremity edema. Pulses are 2 + in the bilateral DP/PT.  Cardiac cath 02/28/16:  Prox RCA lesion, 30% stenosed.  Mid RCA to Dist RCA lesion, 40% stenosed.  Prox LAD to Mid LAD lesion, 30% stenosed.  1st Mrg lesion, 99% stenosed. Post intervention, there is a 0% residual  stenosis.  The left ventricular systolic function is normal.   1. Severe (99% ) one-vessel coronary artery disease involving proximal OM1 which is very large and supplies most of the left circumflex distribution. 2. Normal LV systolic function and mildly elevated left ventricular end-diastolic pressure. 3. Successful angioplasty and drug-eluting stent placement to OM 1.  EKG:  EKG is ordered today. The ekg ordered today demonstrates sinus brady, rate 58 bpm  Recent Labs: 02/27/2016: TSH 1.368 02/29/2016: Hemoglobin 14.7; Platelets 271 04/24/2016: ALT 24; BUN 15; Creat 0.86; Potassium 4.3; Sodium 139   Lipid Panel    Component Value Date/Time   CHOL 124 (L) 04/24/2016 0847   TRIG 113 04/24/2016 0847   HDL 36 (L) 04/24/2016 0847   CHOLHDL 3.4 04/24/2016 0847   VLDL 23 04/24/2016 0847   LDLCALC 65 04/24/2016 0847     Wt Readings from Last 3 Encounters:  12/27/16 169 lb (76.7 kg)  07/29/16 170 lb 10.2 oz (77.4 kg)  06/13/16 174 lb (78.9 kg)     Other studies Reviewed: Additional studies/ records that were reviewed today include: . Review of the above records demonstrates:   Assessment and Plan:   1. CAD without angina: He has no chest pain suggestive of angina. Cardiac cath May 2017 with severe stenosis in the OM branch treated with a drug eluting stent. Will continue dual antiplatelet therapy with ASA and Plavix. Wil continue statin and beta blocker.   2. HLD: He is on a statin. LDL is at goal.  He will have his lipids checked in primary care next week.   3. PVCs: He has worn a Holter monitor which demonstrated PVCs. He has seen Dr. Graciela Husbands and plans to continue beta blocker. Will arrange echo now to make sure LV function has not changed given his PVCs.   Current medicines are reviewed at length with the patient today.  The patient does not have concerns regarding medicines.  The following changes have been made:  no change  Labs/ tests ordered today include:   Orders  Placed This Encounter  Procedures  . EKG 12-Lead  . ECHOCARDIOGRAM COMPLETE    Disposition:   FU with me in 6 months   Signed, Verne Carrow, MD 12/27/2016 9:23 AM    [facility] [address] [place], [place], [place]  [address] Phone: [phone]; Fax: [phone]

## 2016-12-27 NOTE — Patient Instructions (Signed)

## 2016-12-30 ENCOUNTER — Encounter (HOSPITAL_COMMUNITY)
Admission: RE | Admit: 2016-12-30 | Discharge: 2016-12-30 | Disposition: A | Discharge status: Home / Self Care | Payer: Self-pay | Source: Ambulatory Visit | Attending: Cardiovascular Disease | Admitting: Cardiovascular Disease

## 2017-01-01 ENCOUNTER — Encounter (HOSPITAL_COMMUNITY)
Admission: RE | Admit: 2017-01-01 | Discharge: 2017-01-01 | Disposition: A | Discharge status: Home / Self Care | Payer: Self-pay | Source: Ambulatory Visit | Attending: Cardiovascular Disease | Admitting: Cardiovascular Disease

## 2017-01-03 ENCOUNTER — Encounter (HOSPITAL_COMMUNITY)
Admission: RE | Admit: 2017-01-03 | Discharge: 2017-01-03 | Disposition: A | Discharge status: Home / Self Care | Payer: Self-pay | Source: Ambulatory Visit | Attending: Cardiovascular Disease | Admitting: Cardiovascular Disease

## 2017-01-06 ENCOUNTER — Encounter (HOSPITAL_COMMUNITY): Payer: Self-pay

## 2017-01-08 ENCOUNTER — Encounter (HOSPITAL_COMMUNITY)
Admission: RE | Admit: 2017-01-08 | Discharge: 2017-01-08 | Disposition: A | Discharge status: Home / Self Care | Payer: Self-pay | Source: Ambulatory Visit | Attending: Cardiovascular Disease | Admitting: Cardiovascular Disease

## 2017-01-10 ENCOUNTER — Encounter (HOSPITAL_COMMUNITY)
Admission: RE | Admit: 2017-01-10 | Discharge: 2017-01-10 | Disposition: A | Discharge status: Home / Self Care | Payer: Self-pay | Source: Ambulatory Visit | Attending: Cardiovascular Disease | Admitting: Cardiovascular Disease

## 2017-01-13 ENCOUNTER — Encounter (HOSPITAL_COMMUNITY)
Admission: RE | Admit: 2017-01-13 | Discharge: 2017-01-13 | Disposition: A | Discharge status: Home / Self Care | Payer: Self-pay | Source: Ambulatory Visit | Attending: Cardiovascular Disease | Admitting: Cardiovascular Disease

## 2017-01-13 DIAGNOSIS — I2510 Atherosclerotic heart disease of native coronary artery without angina pectoris: Secondary | ICD-10-CM | POA: Insufficient documentation

## 2017-01-15 ENCOUNTER — Ambulatory Visit (HOSPITAL_COMMUNITY): Payer: Medicare Other | Attending: Internal Medicine

## 2017-01-15 ENCOUNTER — Other Ambulatory Visit: Payer: Self-pay

## 2017-01-15 ENCOUNTER — Encounter (HOSPITAL_COMMUNITY)
Admission: RE | Admit: 2017-01-15 | Discharge: 2017-01-15 | Disposition: A | Discharge status: Home / Self Care | Payer: Self-pay | Source: Ambulatory Visit | Attending: Cardiovascular Disease | Admitting: Cardiovascular Disease

## 2017-01-15 DIAGNOSIS — I493 Ventricular premature depolarization: Secondary | ICD-10-CM | POA: Insufficient documentation

## 2017-01-15 DIAGNOSIS — I2510 Atherosclerotic heart disease of native coronary artery without angina pectoris: Secondary | ICD-10-CM | POA: Diagnosis present

## 2017-01-15 DIAGNOSIS — Z87891 Personal history of nicotine dependence: Secondary | ICD-10-CM | POA: Insufficient documentation

## 2017-01-15 DIAGNOSIS — I071 Rheumatic tricuspid insufficiency: Secondary | ICD-10-CM | POA: Insufficient documentation

## 2017-01-15 DIAGNOSIS — E785 Hyperlipidemia, unspecified: Secondary | ICD-10-CM | POA: Insufficient documentation

## 2017-01-17 ENCOUNTER — Encounter (HOSPITAL_COMMUNITY)
Admission: RE | Admit: 2017-01-17 | Discharge: 2017-01-17 | Disposition: A | Discharge status: Home / Self Care | Payer: Self-pay | Source: Ambulatory Visit | Attending: Cardiovascular Disease | Admitting: Cardiovascular Disease

## 2017-01-20 ENCOUNTER — Encounter (HOSPITAL_COMMUNITY): Payer: Self-pay

## 2017-01-22 ENCOUNTER — Encounter (HOSPITAL_COMMUNITY): Payer: Self-pay

## 2017-01-24 ENCOUNTER — Encounter (HOSPITAL_COMMUNITY): Payer: Self-pay

## 2017-01-27 ENCOUNTER — Encounter (HOSPITAL_COMMUNITY)
Admission: RE | Admit: 2017-01-27 | Discharge: 2017-01-27 | Disposition: A | Discharge status: Home / Self Care | Payer: Self-pay | Source: Ambulatory Visit | Attending: Cardiovascular Disease | Admitting: Cardiovascular Disease

## 2017-01-29 ENCOUNTER — Encounter (HOSPITAL_COMMUNITY)
Admission: RE | Admit: 2017-01-29 | Discharge: 2017-01-29 | Disposition: A | Discharge status: Home / Self Care | Payer: Self-pay | Source: Ambulatory Visit | Attending: Cardiovascular Disease | Admitting: Cardiovascular Disease

## 2017-01-31 ENCOUNTER — Encounter (HOSPITAL_COMMUNITY): Payer: Self-pay

## 2017-02-03 ENCOUNTER — Encounter (HOSPITAL_COMMUNITY)
Admission: RE | Admit: 2017-02-03 | Discharge: 2017-02-03 | Disposition: A | Discharge status: Home / Self Care | Payer: Self-pay | Source: Ambulatory Visit | Attending: Cardiovascular Disease | Admitting: Cardiovascular Disease

## 2017-02-05 ENCOUNTER — Encounter (HOSPITAL_COMMUNITY): Payer: Self-pay

## 2017-02-07 ENCOUNTER — Encounter (HOSPITAL_COMMUNITY): Payer: Self-pay

## 2017-02-10 ENCOUNTER — Encounter (HOSPITAL_COMMUNITY): Payer: Self-pay

## 2017-02-12 ENCOUNTER — Encounter (HOSPITAL_COMMUNITY): Payer: Self-pay

## 2017-02-12 DIAGNOSIS — I2510 Atherosclerotic heart disease of native coronary artery without angina pectoris: Secondary | ICD-10-CM | POA: Insufficient documentation

## 2017-02-14 ENCOUNTER — Encounter (HOSPITAL_COMMUNITY)
Admission: RE | Admit: 2017-02-14 | Discharge: 2017-02-14 | Disposition: A | Discharge status: Home / Self Care | Payer: Self-pay | Source: Ambulatory Visit | Attending: Cardiovascular Disease | Admitting: Cardiovascular Disease

## 2017-02-17 ENCOUNTER — Encounter (HOSPITAL_COMMUNITY): Payer: Self-pay

## 2017-02-19 ENCOUNTER — Encounter (HOSPITAL_COMMUNITY): Payer: Self-pay

## 2017-02-21 ENCOUNTER — Encounter (HOSPITAL_COMMUNITY)
Admission: RE | Admit: 2017-02-21 | Discharge: 2017-02-21 | Disposition: A | Discharge status: Home / Self Care | Payer: Self-pay | Source: Ambulatory Visit | Attending: Cardiovascular Disease | Admitting: Cardiovascular Disease

## 2017-02-24 ENCOUNTER — Encounter (HOSPITAL_COMMUNITY)
Admission: RE | Admit: 2017-02-24 | Discharge: 2017-02-24 | Disposition: A | Discharge status: Home / Self Care | Payer: Self-pay | Source: Ambulatory Visit | Attending: Cardiovascular Disease | Admitting: Cardiovascular Disease

## 2017-02-26 ENCOUNTER — Encounter (HOSPITAL_COMMUNITY)
Admission: RE | Admit: 2017-02-26 | Discharge: 2017-02-26 | Disposition: A | Discharge status: Home / Self Care | Payer: Self-pay | Source: Ambulatory Visit | Attending: Cardiovascular Disease | Admitting: Cardiovascular Disease

## 2017-02-28 ENCOUNTER — Encounter (HOSPITAL_COMMUNITY)
Admission: RE | Admit: 2017-02-28 | Discharge: 2017-02-28 | Disposition: A | Discharge status: Home / Self Care | Payer: Self-pay | Source: Ambulatory Visit | Attending: Cardiovascular Disease | Admitting: Cardiovascular Disease

## 2017-03-03 ENCOUNTER — Encounter (HOSPITAL_COMMUNITY)
Admission: RE | Admit: 2017-03-03 | Discharge: 2017-03-03 | Disposition: A | Discharge status: Home / Self Care | Payer: Self-pay | Source: Ambulatory Visit | Attending: Cardiovascular Disease | Admitting: Cardiovascular Disease

## 2017-03-05 ENCOUNTER — Encounter (HOSPITAL_COMMUNITY): Payer: Self-pay

## 2017-03-07 ENCOUNTER — Encounter (HOSPITAL_COMMUNITY): Payer: Self-pay

## 2017-03-12 ENCOUNTER — Encounter (HOSPITAL_COMMUNITY): Payer: Self-pay

## 2017-03-14 ENCOUNTER — Encounter (HOSPITAL_COMMUNITY)
Admission: RE | Admit: 2017-03-14 | Discharge: 2017-03-14 | Disposition: A | Discharge status: Home / Self Care | Payer: Self-pay | Source: Ambulatory Visit | Attending: Cardiovascular Disease | Admitting: Cardiovascular Disease

## 2017-03-14 DIAGNOSIS — I2510 Atherosclerotic heart disease of native coronary artery without angina pectoris: Secondary | ICD-10-CM | POA: Insufficient documentation

## 2017-03-17 ENCOUNTER — Encounter (HOSPITAL_COMMUNITY)
Admission: RE | Admit: 2017-03-17 | Discharge: 2017-03-17 | Disposition: A | Discharge status: Home / Self Care | Payer: Self-pay | Source: Ambulatory Visit | Attending: Cardiovascular Disease | Admitting: Cardiovascular Disease

## 2017-03-19 ENCOUNTER — Encounter (HOSPITAL_COMMUNITY)
Admission: RE | Admit: 2017-03-19 | Discharge: 2017-03-19 | Disposition: A | Discharge status: Home / Self Care | Payer: Self-pay | Source: Ambulatory Visit | Attending: Cardiovascular Disease | Admitting: Cardiovascular Disease

## 2017-03-21 ENCOUNTER — Encounter (HOSPITAL_COMMUNITY): Payer: Self-pay

## 2017-03-24 ENCOUNTER — Encounter (HOSPITAL_COMMUNITY)
Admission: RE | Admit: 2017-03-24 | Discharge: 2017-03-24 | Disposition: A | Discharge status: Home / Self Care | Payer: Self-pay | Source: Ambulatory Visit | Attending: Cardiovascular Disease | Admitting: Cardiovascular Disease

## 2017-03-26 ENCOUNTER — Encounter (HOSPITAL_COMMUNITY)
Admission: RE | Admit: 2017-03-26 | Discharge: 2017-03-26 | Disposition: A | Discharge status: Home / Self Care | Payer: Self-pay | Source: Ambulatory Visit | Attending: Cardiovascular Disease | Admitting: Cardiovascular Disease

## 2017-03-28 ENCOUNTER — Encounter (HOSPITAL_COMMUNITY)
Admission: RE | Admit: 2017-03-28 | Discharge: 2017-03-28 | Disposition: A | Discharge status: Home / Self Care | Payer: Self-pay | Source: Ambulatory Visit | Attending: Cardiovascular Disease | Admitting: Cardiovascular Disease

## 2017-03-31 ENCOUNTER — Encounter (HOSPITAL_COMMUNITY): Payer: Self-pay

## 2017-04-02 ENCOUNTER — Encounter (HOSPITAL_COMMUNITY): Payer: Self-pay

## 2017-04-04 ENCOUNTER — Encounter (HOSPITAL_COMMUNITY): Payer: Self-pay

## 2017-04-07 ENCOUNTER — Encounter (HOSPITAL_COMMUNITY)
Admission: RE | Admit: 2017-04-07 | Discharge: 2017-04-07 | Disposition: A | Discharge status: Home / Self Care | Payer: Self-pay | Source: Ambulatory Visit | Attending: Cardiovascular Disease | Admitting: Cardiovascular Disease

## 2017-04-09 ENCOUNTER — Encounter (HOSPITAL_COMMUNITY)
Admission: RE | Admit: 2017-04-09 | Discharge: 2017-04-09 | Disposition: A | Discharge status: Home / Self Care | Payer: Self-pay | Source: Ambulatory Visit | Attending: Cardiovascular Disease | Admitting: Cardiovascular Disease

## 2017-04-11 ENCOUNTER — Encounter (HOSPITAL_COMMUNITY): Payer: Self-pay

## 2017-04-14 ENCOUNTER — Encounter (HOSPITAL_COMMUNITY)
Admission: RE | Admit: 2017-04-14 | Discharge: 2017-04-14 | Disposition: A | Discharge status: Home / Self Care | Payer: Self-pay | Source: Ambulatory Visit | Attending: Cardiovascular Disease | Admitting: Cardiovascular Disease

## 2017-04-14 DIAGNOSIS — I2510 Atherosclerotic heart disease of native coronary artery without angina pectoris: Secondary | ICD-10-CM | POA: Insufficient documentation

## 2017-04-18 ENCOUNTER — Encounter (HOSPITAL_COMMUNITY)
Admission: RE | Admit: 2017-04-18 | Discharge: 2017-04-18 | Disposition: A | Discharge status: Home / Self Care | Payer: Self-pay | Source: Ambulatory Visit | Attending: Cardiovascular Disease | Admitting: Cardiovascular Disease

## 2017-04-21 ENCOUNTER — Encounter (HOSPITAL_COMMUNITY)
Admission: RE | Admit: 2017-04-21 | Discharge: 2017-04-21 | Disposition: A | Discharge status: Home / Self Care | Payer: Self-pay | Source: Ambulatory Visit | Attending: Cardiovascular Disease | Admitting: Cardiovascular Disease

## 2017-04-23 ENCOUNTER — Encounter (HOSPITAL_COMMUNITY): Payer: Self-pay

## 2017-04-25 ENCOUNTER — Encounter (HOSPITAL_COMMUNITY): Payer: Self-pay

## 2017-04-28 ENCOUNTER — Encounter (HOSPITAL_COMMUNITY)
Admission: RE | Admit: 2017-04-28 | Discharge: 2017-04-28 | Disposition: A | Discharge status: Home / Self Care | Payer: Self-pay | Source: Ambulatory Visit | Attending: Cardiovascular Disease | Admitting: Cardiovascular Disease

## 2017-04-30 ENCOUNTER — Encounter (HOSPITAL_COMMUNITY)
Admission: RE | Admit: 2017-04-30 | Discharge: 2017-04-30 | Disposition: A | Discharge status: Home / Self Care | Payer: Self-pay | Source: Ambulatory Visit | Attending: Cardiovascular Disease | Admitting: Cardiovascular Disease

## 2017-05-02 ENCOUNTER — Encounter (HOSPITAL_COMMUNITY)
Admission: RE | Admit: 2017-05-02 | Discharge: 2017-05-02 | Disposition: A | Discharge status: Home / Self Care | Payer: Self-pay | Source: Ambulatory Visit | Attending: Cardiovascular Disease | Admitting: Cardiovascular Disease

## 2017-05-05 ENCOUNTER — Encounter (HOSPITAL_COMMUNITY)
Admission: RE | Admit: 2017-05-05 | Discharge: 2017-05-05 | Disposition: A | Discharge status: Home / Self Care | Payer: Self-pay | Source: Ambulatory Visit | Attending: Cardiovascular Disease | Admitting: Cardiovascular Disease

## 2017-05-07 ENCOUNTER — Encounter (HOSPITAL_COMMUNITY): Payer: Self-pay

## 2017-05-09 ENCOUNTER — Encounter (HOSPITAL_COMMUNITY): Payer: Self-pay

## 2017-05-12 ENCOUNTER — Encounter (HOSPITAL_COMMUNITY)
Admission: RE | Admit: 2017-05-12 | Discharge: 2017-05-12 | Disposition: A | Discharge status: Home / Self Care | Payer: Self-pay | Source: Ambulatory Visit | Attending: Cardiovascular Disease | Admitting: Cardiovascular Disease

## 2017-05-14 ENCOUNTER — Encounter (HOSPITAL_COMMUNITY)
Admission: RE | Admit: 2017-05-14 | Discharge: 2017-05-14 | Disposition: A | Discharge status: Home / Self Care | Payer: Self-pay | Source: Ambulatory Visit | Attending: Cardiovascular Disease | Admitting: Cardiovascular Disease

## 2017-05-14 DIAGNOSIS — I2510 Atherosclerotic heart disease of native coronary artery without angina pectoris: Secondary | ICD-10-CM | POA: Insufficient documentation

## 2017-05-16 ENCOUNTER — Encounter (HOSPITAL_COMMUNITY): Payer: Self-pay

## 2017-05-19 ENCOUNTER — Encounter (HOSPITAL_COMMUNITY): Payer: Self-pay

## 2017-05-21 ENCOUNTER — Encounter (HOSPITAL_COMMUNITY)
Admission: RE | Admit: 2017-05-21 | Discharge: 2017-05-21 | Disposition: A | Discharge status: Home / Self Care | Payer: Self-pay | Source: Ambulatory Visit | Attending: Cardiovascular Disease | Admitting: Cardiovascular Disease

## 2017-05-23 ENCOUNTER — Encounter (HOSPITAL_COMMUNITY): Payer: Self-pay

## 2017-05-26 ENCOUNTER — Encounter (HOSPITAL_COMMUNITY)
Admission: RE | Admit: 2017-05-26 | Discharge: 2017-05-26 | Disposition: A | Discharge status: Home / Self Care | Payer: Self-pay | Source: Ambulatory Visit | Attending: Cardiovascular Disease | Admitting: Cardiovascular Disease

## 2017-05-28 ENCOUNTER — Encounter (HOSPITAL_COMMUNITY)
Admission: RE | Admit: 2017-05-28 | Discharge: 2017-05-28 | Disposition: A | Discharge status: Home / Self Care | Payer: Self-pay | Source: Ambulatory Visit | Attending: Cardiovascular Disease | Admitting: Cardiovascular Disease

## 2017-05-30 ENCOUNTER — Encounter (HOSPITAL_COMMUNITY)
Admission: RE | Admit: 2017-05-30 | Discharge: 2017-05-30 | Disposition: A | Discharge status: Home / Self Care | Payer: Self-pay | Source: Ambulatory Visit | Attending: Cardiovascular Disease | Admitting: Cardiovascular Disease

## 2017-06-02 ENCOUNTER — Encounter (HOSPITAL_COMMUNITY)
Admission: RE | Admit: 2017-06-02 | Discharge: 2017-06-02 | Disposition: A | Discharge status: Home / Self Care | Payer: Self-pay | Source: Ambulatory Visit | Attending: Cardiovascular Disease | Admitting: Cardiovascular Disease

## 2017-06-04 ENCOUNTER — Encounter (HOSPITAL_COMMUNITY)
Admission: RE | Admit: 2017-06-04 | Discharge: 2017-06-04 | Disposition: A | Discharge status: Home / Self Care | Payer: Self-pay | Source: Ambulatory Visit | Attending: Cardiovascular Disease | Admitting: Cardiovascular Disease

## 2017-06-06 ENCOUNTER — Encounter (HOSPITAL_COMMUNITY): Payer: Self-pay

## 2017-06-09 ENCOUNTER — Encounter (HOSPITAL_COMMUNITY): Payer: Self-pay

## 2017-06-11 ENCOUNTER — Encounter (HOSPITAL_COMMUNITY)
Admission: RE | Admit: 2017-06-11 | Discharge: 2017-06-11 | Disposition: A | Discharge status: Home / Self Care | Payer: Self-pay | Source: Ambulatory Visit | Attending: Cardiovascular Disease | Admitting: Cardiovascular Disease

## 2017-06-13 ENCOUNTER — Encounter (HOSPITAL_COMMUNITY)
Admission: RE | Admit: 2017-06-13 | Discharge: 2017-06-13 | Disposition: A | Discharge status: Home / Self Care | Payer: Self-pay | Source: Ambulatory Visit | Attending: Cardiovascular Disease | Admitting: Cardiovascular Disease

## 2017-06-18 ENCOUNTER — Encounter (HOSPITAL_COMMUNITY)
Admission: RE | Admit: 2017-06-18 | Discharge: 2017-06-18 | Disposition: A | Discharge status: Home / Self Care | Payer: Medicare Other | Source: Ambulatory Visit | Attending: Cardiovascular Disease | Admitting: Cardiovascular Disease

## 2017-06-18 DIAGNOSIS — I2510 Atherosclerotic heart disease of native coronary artery without angina pectoris: Secondary | ICD-10-CM | POA: Insufficient documentation

## 2017-06-20 ENCOUNTER — Encounter (HOSPITAL_COMMUNITY)
Admission: RE | Admit: 2017-06-20 | Discharge: 2017-06-20 | Disposition: A | Discharge status: Home / Self Care | Payer: Medicare Other | Source: Ambulatory Visit | Attending: Cardiovascular Disease | Admitting: Cardiovascular Disease

## 2017-06-23 ENCOUNTER — Encounter (HOSPITAL_COMMUNITY): Payer: Medicare Other

## 2017-06-25 ENCOUNTER — Encounter (HOSPITAL_COMMUNITY)
Admission: RE | Admit: 2017-06-25 | Discharge: 2017-06-25 | Disposition: A | Discharge status: Home / Self Care | Payer: Medicare Other | Source: Ambulatory Visit | Attending: Cardiovascular Disease | Admitting: Cardiovascular Disease

## 2017-06-27 ENCOUNTER — Encounter (HOSPITAL_COMMUNITY): Payer: Medicare Other

## 2017-06-30 ENCOUNTER — Encounter (HOSPITAL_COMMUNITY): Payer: Medicare Other

## 2017-07-02 ENCOUNTER — Encounter (HOSPITAL_COMMUNITY): Payer: Medicare Other

## 2017-07-04 ENCOUNTER — Encounter (HOSPITAL_COMMUNITY)
Admission: RE | Admit: 2017-07-04 | Discharge: 2017-07-04 | Disposition: A | Discharge status: Home / Self Care | Payer: Medicare Other | Source: Ambulatory Visit | Attending: Cardiovascular Disease | Admitting: Cardiovascular Disease

## 2017-07-07 ENCOUNTER — Encounter (HOSPITAL_COMMUNITY)
Admission: RE | Admit: 2017-07-07 | Discharge: 2017-07-07 | Disposition: A | Discharge status: Home / Self Care | Payer: Medicare Other | Source: Ambulatory Visit | Attending: Cardiovascular Disease | Admitting: Cardiovascular Disease

## 2017-07-09 ENCOUNTER — Encounter (HOSPITAL_COMMUNITY)
Admission: RE | Admit: 2017-07-09 | Discharge: 2017-07-09 | Disposition: A | Discharge status: Home / Self Care | Payer: Medicare Other | Source: Ambulatory Visit | Attending: Cardiovascular Disease | Admitting: Cardiovascular Disease

## 2017-07-11 ENCOUNTER — Encounter (HOSPITAL_COMMUNITY): Payer: Medicare Other

## 2017-07-14 ENCOUNTER — Other Ambulatory Visit: Payer: Self-pay | Admitting: Cardiology

## 2017-07-16 ENCOUNTER — Other Ambulatory Visit: Payer: Self-pay | Admitting: Cardiovascular Disease

## 2017-07-23 ENCOUNTER — Encounter (HOSPITAL_COMMUNITY)
Admission: RE | Admit: 2017-07-23 | Discharge: 2017-07-23 | Disposition: A | Discharge status: Home / Self Care | Payer: Self-pay | Source: Ambulatory Visit | Attending: Cardiovascular Disease | Admitting: Cardiovascular Disease

## 2017-07-23 DIAGNOSIS — I2510 Atherosclerotic heart disease of native coronary artery without angina pectoris: Secondary | ICD-10-CM | POA: Insufficient documentation

## 2017-07-25 ENCOUNTER — Encounter (HOSPITAL_COMMUNITY): Payer: Self-pay

## 2017-07-28 ENCOUNTER — Encounter (HOSPITAL_COMMUNITY)
Admission: RE | Admit: 2017-07-28 | Discharge: 2017-07-28 | Disposition: A | Discharge status: Home / Self Care | Payer: Self-pay | Source: Ambulatory Visit | Attending: Cardiovascular Disease | Admitting: Cardiovascular Disease

## 2017-07-30 ENCOUNTER — Encounter (HOSPITAL_COMMUNITY): Payer: Self-pay

## 2017-07-31 ENCOUNTER — Ambulatory Visit: Payer: Self-pay | Admitting: Cardiovascular Disease

## 2017-08-01 ENCOUNTER — Encounter (HOSPITAL_COMMUNITY)
Admission: RE | Admit: 2017-08-01 | Discharge: 2017-08-01 | Disposition: A | Discharge status: Home / Self Care | Payer: Self-pay | Source: Ambulatory Visit | Attending: Cardiovascular Disease | Admitting: Cardiovascular Disease

## 2017-08-04 ENCOUNTER — Encounter (HOSPITAL_COMMUNITY): Payer: Self-pay

## 2017-08-06 ENCOUNTER — Encounter (HOSPITAL_COMMUNITY): Payer: Self-pay

## 2017-08-07 ENCOUNTER — Encounter: Payer: Self-pay | Admitting: Cardiovascular Disease

## 2017-08-07 ENCOUNTER — Ambulatory Visit (INDEPENDENT_AMBULATORY_CARE_PROVIDER_SITE_OTHER): Payer: Medicare Other | Admitting: Cardiovascular Disease

## 2017-08-07 VITALS — BP 118/70 | HR 54 | Ht 71.0 in | Wt 175.6 lb

## 2017-08-07 DIAGNOSIS — I2510 Atherosclerotic heart disease of native coronary artery without angina pectoris: Secondary | ICD-10-CM | POA: Diagnosis not present

## 2017-08-07 DIAGNOSIS — E7800 Pure hypercholesterolemia, unspecified: Secondary | ICD-10-CM

## 2017-08-07 DIAGNOSIS — I493 Ventricular premature depolarization: Secondary | ICD-10-CM | POA: Diagnosis not present

## 2017-08-07 NOTE — Progress Notes (Signed)
Chief Complaint  Patient presents with  . Follow-up    CAD    History of Present Illness: 66 yo male with history of CAD, PVCs, HLD, GERD who is here today for cardiac follow up. He had a cardiac cath in May 2017 in the setting of unstable angina which showed a severe stenosis in the first obtuse marginal branch. A drug eluting stent was placed in the first OM. There was mild disease in the RCA and LAD. LV function was normal.  Frequent PVCs noted during his hospital stay. Cardiac monitor June 2017 with frequent PVCS and periods of sinus bradycardia. He was seen in the EP clinic by Dr. Graciela Husbands and he recommended the patient continue his beta blocker.   He is here today for follow up. The patient denies any chest pain, dyspnea, palpitations, lower extremity edema, orthopnea, PND, dizziness, near syncope or syncope.    Primary Care Physician: Nila Nephew, MD   Past Medical History:  Diagnosis Date  . CAD (coronary artery disease)    a. DES to OM1 02/2016.   . Cataract    bil cateracts  . Diverticulosis   . Esophageal stricture   . GERD (gastroesophageal reflux disease)   . Hemorrhoids   . Hiatal hernia   . Hyperlipidemia   . IBS (irritable bowel syndrome)   . Multiple food allergies    "lots of food allergies; beef, tuna, flounder, potatoes, garlic, tomatoes, apples, bananas, sesame seeds, shellfish  . Pre-diabetes   . PVC's (premature ventricular contractions)     Past Surgical History:  Procedure Laterality Date  . CARDIAC CATHETERIZATION N/A 02/28/2016   Procedure: Left Heart Cath and Coronary Angiography;  Surgeon: Iran Ouch, MD;  [facility]: [facility];  Service: Cardiovascular;  Laterality: N/A;  . CARDIAC CATHETERIZATION N/A 02/28/2016   Procedure: Coronary Stent Intervention;  Surgeon: Iran Ouch, MD;  Location: [facility];  Service: Cardiovascular;  Laterality: N/A;  . COLONOSCOPY    . CYSTOSCOPY WITH STENT PLACEMENT    .  ESOPHAGOGASTRODUODENOSCOPY (EGD) WITH ESOPHAGEAL DILATION    . HEMORRHOID BANDING    . HERNIA REPAIR    . SURGERY SCROTAL / TESTICULAR Left ~ 1960   "hernia"  . TONSILLECTOMY    . UPPER GASTROINTESTINAL ENDOSCOPY      Current Outpatient Prescriptions  Medication Sig Dispense Refill  . atorvastatin (LIPITOR) 40 MG tablet Take 1 tablet (40 mg total) by mouth daily. 30 tablet 5  . CIALIS 5 MG tablet Take 5 mg by mouth daily as needed for erectile dysfunction.   98  . clopidogrel (PLAVIX) 75 MG tablet Take 1 tablet (75 mg total) by mouth daily with breakfast. 90 tablet 3  . halobetasol (ULTRAVATE) 0.05 % cream Apply 1 application topically daily.     . Magnesium 250 MG TABS Take 250 mg by mouth daily.    . metoprolol tartrate (LOPRESSOR) 25 MG tablet Take 1 tablet (25 mg total) by mouth 2 (two) times daily. 180 tablet 3  . nitroGLYCERIN (NITROSTAT) 0.4 MG SL tablet PLACE 1 TABLET UNDER THE TONGUE EVERY 5 MINUTES AS NEEDED FOR CHEST PAIN. MAX 3 DOSES 75 tablet 0  . pantoprazole (PROTONIX) 40 MG tablet Take 1 tablet (40 mg total) by mouth daily. 90 tablet 3  . Probiotic Product (PROBIOTIC DAILY PO) Take 1 capsule by mouth daily.     Marland Kitchen triamcinolone (NASACORT ALLERGY 24HR) 55 MCG/ACT AERO nasal inhaler Place 2 sprays into the nose daily  as needed (allergies).      No current facility-administered medications for this visit.     Allergies  Allergen Reactions  . Aleve [Naproxen Sodium]     Doesn't remember  . Apple Swelling  . Banana Nausea And Vomiting  . Beef-Derived Products Nausea And Vomiting  . Erythromycin Swelling    Mouth swells  . Fish Allergy Swelling  . Garlic Swelling  . Sesame Seed (Diagnostic) Swelling  . Shellfish Allergy Other (See Comments)    Not sure of reaction found out about allergy through [facility]  . Sulfonamide Derivatives Swelling    Mouth swelling  . Tomato Nausea And Vomiting  . Iodine Swelling    "I blow up like a balloon"  . Strawberry (Diagnostic)  Rash    Social History   Social History  . Marital status: Married    Spouse name: N/A  . Number of children: N/A  . Years of education: N/A   Occupational History  . Not on file.   Social History Main Topics  . Smoking status: Former Smoker    Packs/day: 1.00    Years: 20.00    Types: Cigarettes  . Smokeless tobacco: Never Used     Comment: "quit smoking cigarettes in the 1980s"  . Alcohol use 0.0 oz/week     Comment: 02/27/2016 "nothing in the last few weeks; no regular pattern"  . Drug use: No  . Sexual activity: Yes   Other Topics Concern  . Not on file   Social History Narrative  . No narrative on file    Family History  Problem Relation Age of Onset  . Heart disease Mother   . Heart disease Father   . Colon cancer Neg Hx   . Esophageal cancer Neg Hx   . Rectal cancer Neg Hx   . Stomach cancer Neg Hx     Review of Systems:  As stated in the HPI and otherwise negative.   BP 118/70   Pulse (!) 54   Ht 5\' 11"  (1.803 m)   Wt 175 lb 9.6 oz (79.7 kg)   SpO2 98%   BMI 24.49 kg/m   Physical Examination:  General: Well developed, well nourished, NAD  HEENT: OP clear, mucus membranes moist  SKIN: warm, dry. No rashes. Neuro: No focal deficits  Musculoskeletal: Muscle strength 5/5 all ext  Psychiatric: Mood and affect normal  Neck: No JVD, no carotid bruits, no thyromegaly, no lymphadenopathy.  Lungs:Clear bilaterally, no wheezes, rhonci, crackles Cardiovascular: Regular rate and rhythm. No murmurs, gallops or rubs. Abdomen:Soft. Bowel sounds present. Non-tender.  Extremities: No lower extremity edema. Pulses are 2 + in the bilateral DP/PT.  Cardiac cath 02/28/16:  Prox RCA lesion, 30% stenosed.  Mid RCA to Dist RCA lesion, 40% stenosed.  Prox LAD to Mid LAD lesion, 30% stenosed.  1st Mrg lesion, 99% stenosed. Post intervention, there is a 0% residual stenosis.  The left ventricular systolic function is normal.   1. Severe (99% ) one-vessel  coronary artery disease involving proximal OM1 which is very large and supplies most of the left circumflex distribution. 2. Normal LV systolic function and mildly elevated left ventricular end-diastolic pressure. 3. Successful angioplasty and drug-eluting stent placement to OM 1.  Echo 01/15/17: - Left ventricle: The cavity size was normal. Wall thickness was   normal. Systolic function was normal. The estimated ejection   fraction was in the range of 60% to 65%. Wall motion was normal;   there were no regional wall motion  abnormalities. Doppler   parameters are consistent with abnormal left ventricular   relaxation (grade 1 diastolic dysfunction). The E/e&' ratio is   between 8-15, suggesting indeterminate LV filling pressure. - Mitral valve: Mildly thickened leaflets . There was trivial   regurgitation. - Left atrium: The atrium was normal in size. - Atrial septum: No defect or patent foramen ovale was identified. - Tricuspid valve: There was mild regurgitation. - Pulmonary arteries: PA peak pressure: 38 mm Hg (S). - Inferior vena cava: The vessel was normal in size. The   respirophasic diameter changes were in the normal range (>= 50%),   consistent with normal central venous pressure.  Impressions:  - LVEF 60-65%, normal wall thickness, normal wall motion, grade 1   DD with indeterminate LV filling pressure, normal LA size, upper   normal RA size, trivial MR, mild TR, RVSP 38 mmHg, normal IVC.  EKG:  EKG is not ordered today. The ekg ordered today demonstrates   Recent Labs: No results found for requested labs within last 8760 hours.   Lipid Panel    Component Value Date/Time   CHOL 124 (L) 04/24/2016 0847   TRIG 113 04/24/2016 0847   HDL 36 (L) 04/24/2016 0847   CHOLHDL 3.4 04/24/2016 0847   VLDL 23 04/24/2016 0847   LDLCALC 65 04/24/2016 0847     Wt Readings from Last 3 Encounters:  08/07/17 175 lb 9.6 oz (79.7 kg)  12/27/16 169 lb (76.7 kg)  07/29/16 170 lb  10.2 oz (77.4 kg)     Other studies Reviewed: Additional studies/ records that were reviewed today include: . Review of the above records demonstrates:   Assessment and Plan:   1. CAD without angina: He is not having chest pain. He had a drug eluting stent placed in the OM in May 2017. Normal LV function by echo April 2018. Will continue Plavix, statin and beta blocker.  I will stop ASA due to easy bruising.   2. HLD: Lipids followed in primary care. Continue statin.   3. PVCs: He has been seen in EP. Continue beta blocker.   Current medicines are reviewed at length with the patient today.  The patient does not have concerns regarding medicines.  The following changes have been made:  no change  Labs/ tests ordered today include:   No orders of the defined types were placed in this encounter.   Disposition:   FU with me in 12 months  Signed, Verne Carrow, MD 08/07/2017 8:48 AM    [facility] [address] [place], [place], [place]  [address] Phone: [phone]; Fax: [phone]

## 2017-08-07 NOTE — Patient Instructions (Signed)
Medication Instructions:  Your physician has recommended you make the following change in your medication:  Stop aspirin  Labwork: none  Testing/Procedures: none  Follow-Up: Your physician recommends that you schedule a follow-up appointment in: 12 months.  Please call our office in about 9 months to schedule this appointment    Any Other Special Instructions Will Be Listed Below (If Applicable).     If you need a refill on your cardiac medications before your next appointment, please call your pharmacy.   

## 2017-08-08 ENCOUNTER — Encounter (HOSPITAL_COMMUNITY): Payer: Self-pay

## 2017-08-11 ENCOUNTER — Encounter (HOSPITAL_COMMUNITY)
Admission: RE | Admit: 2017-08-11 | Discharge: 2017-08-11 | Disposition: A | Discharge status: Home / Self Care | Payer: Self-pay | Source: Ambulatory Visit | Attending: Cardiovascular Disease | Admitting: Cardiovascular Disease

## 2017-08-13 ENCOUNTER — Encounter (HOSPITAL_COMMUNITY)
Admission: RE | Admit: 2017-08-13 | Discharge: 2017-08-13 | Disposition: A | Discharge status: Home / Self Care | Payer: Self-pay | Source: Ambulatory Visit | Attending: Cardiovascular Disease | Admitting: Cardiovascular Disease

## 2017-08-15 ENCOUNTER — Encounter (HOSPITAL_COMMUNITY)
Admission: RE | Admit: 2017-08-15 | Discharge: 2017-08-15 | Disposition: A | Discharge status: Home / Self Care | Payer: Self-pay | Source: Ambulatory Visit | Attending: Cardiovascular Disease | Admitting: Cardiovascular Disease

## 2017-08-15 DIAGNOSIS — I2510 Atherosclerotic heart disease of native coronary artery without angina pectoris: Secondary | ICD-10-CM | POA: Insufficient documentation

## 2017-08-18 ENCOUNTER — Encounter (HOSPITAL_COMMUNITY): Payer: Self-pay

## 2017-08-20 ENCOUNTER — Encounter (HOSPITAL_COMMUNITY): Payer: Self-pay

## 2017-08-21 ENCOUNTER — Other Ambulatory Visit: Payer: Self-pay | Admitting: Cardiovascular Disease

## 2017-08-21 NOTE — Telephone Encounter (Signed)
Pt's medication was sent to pt's pharmacy as requested. Confirmation received.  °

## 2017-08-22 ENCOUNTER — Encounter (HOSPITAL_COMMUNITY): Payer: Self-pay

## 2017-08-25 ENCOUNTER — Encounter (HOSPITAL_COMMUNITY): Payer: Self-pay

## 2017-08-27 ENCOUNTER — Encounter (HOSPITAL_COMMUNITY): Payer: Self-pay

## 2017-08-29 ENCOUNTER — Encounter (HOSPITAL_COMMUNITY): Payer: Self-pay

## 2017-09-01 ENCOUNTER — Encounter (HOSPITAL_COMMUNITY)
Admission: RE | Admit: 2017-09-01 | Discharge: 2017-09-01 | Disposition: A | Discharge status: Home / Self Care | Payer: Medicare Other | Source: Ambulatory Visit | Attending: Cardiovascular Disease | Admitting: Cardiovascular Disease

## 2017-09-03 ENCOUNTER — Encounter (HOSPITAL_COMMUNITY): Payer: Self-pay

## 2017-09-08 ENCOUNTER — Encounter (HOSPITAL_COMMUNITY)
Admission: RE | Admit: 2017-09-08 | Discharge: 2017-09-08 | Disposition: A | Discharge status: Home / Self Care | Payer: Self-pay | Source: Ambulatory Visit | Attending: Cardiovascular Disease | Admitting: Cardiovascular Disease

## 2017-09-10 ENCOUNTER — Encounter (HOSPITAL_COMMUNITY): Payer: Self-pay

## 2017-09-12 ENCOUNTER — Encounter (HOSPITAL_COMMUNITY): Payer: Self-pay

## 2017-09-13 DIAGNOSIS — I480 Paroxysmal atrial fibrillation: Secondary | ICD-10-CM

## 2017-09-13 HISTORY — DX: Paroxysmal atrial fibrillation: I48.0

## 2017-09-15 ENCOUNTER — Encounter (HOSPITAL_COMMUNITY): Payer: Self-pay

## 2017-09-15 DIAGNOSIS — I2510 Atherosclerotic heart disease of native coronary artery without angina pectoris: Secondary | ICD-10-CM | POA: Insufficient documentation

## 2017-09-17 ENCOUNTER — Encounter (HOSPITAL_COMMUNITY)
Admission: RE | Admit: 2017-09-17 | Discharge: 2017-09-17 | Disposition: A | Discharge status: Home / Self Care | Payer: Medicare Other | Source: Ambulatory Visit | Attending: Cardiovascular Disease | Admitting: Cardiovascular Disease

## 2017-09-19 ENCOUNTER — Encounter (HOSPITAL_COMMUNITY): Payer: Self-pay

## 2017-09-22 ENCOUNTER — Encounter (HOSPITAL_COMMUNITY): Payer: Self-pay

## 2017-09-24 ENCOUNTER — Encounter (HOSPITAL_COMMUNITY): Payer: Self-pay

## 2017-09-26 ENCOUNTER — Encounter (HOSPITAL_COMMUNITY): Payer: Self-pay

## 2017-09-29 ENCOUNTER — Encounter (HOSPITAL_COMMUNITY)
Admission: RE | Admit: 2017-09-29 | Discharge: 2017-09-29 | Disposition: A | Discharge status: Home / Self Care | Payer: Medicare Other | Source: Ambulatory Visit | Attending: Cardiovascular Disease | Admitting: Cardiovascular Disease

## 2017-09-29 ENCOUNTER — Telehealth: Payer: Self-pay | Admitting: Cardiovascular Disease

## 2017-09-29 DIAGNOSIS — R002 Palpitations: Secondary | ICD-10-CM

## 2017-09-29 NOTE — Telephone Encounter (Signed)
I discussed with patient, he is aware he should get [facility] to [facility] appt time for 48 hour holter monitor this week.

## 2017-09-29 NOTE — Telephone Encounter (Signed)
Please see phone note below from cardiac rehab. Can we call him and have him come by this week to pick up a 48 hour cardiac monitor?   Thanks,  Thayer Ohmhris   Dear Dr. Clifton JamesMcAlhany,   Pt reported at cardiac rehab he experienced episodes of tachypalpitations at home. Per his Garmin heart rate monitor, max HR-151. Most recent episode occurred after shoveling snow. Episodes lasted several minutes off and on for a few hours.  Pt has also noticed episodes at work when long hours without breaks. Pt denies chest pain, dizziness or dyspnea.

## 2017-09-29 NOTE — Progress Notes (Signed)
OUTPATIENT CARDIAC REHAB MAINTENANCE   PMH: 02/2016 DES OM1  Primary Cardiologist:  Dr. Clifton JamesMcAlhany  Pt reported at cardiac rehab he experienced episodes of tachypalpitations at home.  Per his Garmin heart rate monitor, max HR-151.  Most recent episode occurred after shoveling snow.  Episodes lasted several minutes off and on for a few hours.   Pt has also noticed episodes at work when long hours without breaks.  Pt denies chest pain, dizziness or dyspnea.  Pt instructed to contact cardiologist to discuss.  Pt verbalized understanding.  Message sent to Dr. Clifton JamesMcAlhany.

## 2017-10-01 ENCOUNTER — Telehealth (HOSPITAL_COMMUNITY): Payer: Self-pay | Admitting: Cardiac Rehabilitation

## 2017-10-01 ENCOUNTER — Encounter (HOSPITAL_COMMUNITY)
Admission: RE | Admit: 2017-10-01 | Discharge: 2017-10-01 | Disposition: A | Discharge status: Home / Self Care | Payer: Medicare Other | Source: Ambulatory Visit | Attending: Cardiovascular Disease | Admitting: Cardiovascular Disease

## 2017-10-03 ENCOUNTER — Encounter (HOSPITAL_COMMUNITY): Payer: Self-pay

## 2017-10-08 ENCOUNTER — Encounter (HOSPITAL_COMMUNITY)
Admission: RE | Admit: 2017-10-08 | Discharge: 2017-10-08 | Disposition: A | Discharge status: Home / Self Care | Payer: Medicare Other | Source: Ambulatory Visit | Attending: Cardiovascular Disease | Admitting: Cardiovascular Disease

## 2017-10-10 ENCOUNTER — Encounter (HOSPITAL_COMMUNITY): Payer: Self-pay

## 2017-10-13 ENCOUNTER — Encounter (HOSPITAL_COMMUNITY): Payer: Self-pay

## 2017-10-15 ENCOUNTER — Encounter (HOSPITAL_COMMUNITY): Payer: Self-pay

## 2017-10-15 ENCOUNTER — Ambulatory Visit (INDEPENDENT_AMBULATORY_CARE_PROVIDER_SITE_OTHER): Payer: Medicare Other

## 2017-10-15 DIAGNOSIS — R002 Palpitations: Secondary | ICD-10-CM

## 2017-10-15 DIAGNOSIS — I2510 Atherosclerotic heart disease of native coronary artery without angina pectoris: Secondary | ICD-10-CM | POA: Insufficient documentation

## 2017-10-17 ENCOUNTER — Encounter (HOSPITAL_COMMUNITY): Payer: Self-pay

## 2017-10-20 ENCOUNTER — Encounter (HOSPITAL_COMMUNITY)
Admission: RE | Admit: 2017-10-20 | Discharge: 2017-10-20 | Disposition: A | Discharge status: Home / Self Care | Payer: Medicare Other | Source: Ambulatory Visit | Attending: Cardiovascular Disease | Admitting: Cardiovascular Disease

## 2017-10-22 ENCOUNTER — Encounter (HOSPITAL_COMMUNITY)
Admission: RE | Admit: 2017-10-22 | Discharge: 2017-10-22 | Disposition: A | Discharge status: Home / Self Care | Payer: Medicare Other | Source: Ambulatory Visit | Attending: Cardiovascular Disease | Admitting: Cardiovascular Disease

## 2017-10-23 ENCOUNTER — Ambulatory Visit (INDEPENDENT_AMBULATORY_CARE_PROVIDER_SITE_OTHER): Payer: Medicare Other | Admitting: Cardiovascular Disease

## 2017-10-23 ENCOUNTER — Encounter: Payer: Self-pay | Admitting: Cardiovascular Disease

## 2017-10-23 VITALS — BP 122/70 | HR 63 | Ht 71.0 in | Wt 174.6 lb

## 2017-10-23 DIAGNOSIS — I2510 Atherosclerotic heart disease of native coronary artery without angina pectoris: Secondary | ICD-10-CM

## 2017-10-23 DIAGNOSIS — I4891 Unspecified atrial fibrillation: Secondary | ICD-10-CM

## 2017-10-23 DIAGNOSIS — E7800 Pure hypercholesterolemia, unspecified: Secondary | ICD-10-CM

## 2017-10-23 MED ORDER — ASPIRIN EC 81 MG PO TBEC: 81.0000 mg | DELAYED_RELEASE_TABLET | Freq: Every day | ORAL | 3 refills | Status: AC

## 2017-10-23 MED ORDER — RIVAROXABAN 20 MG PO TABS: 20.0000 mg | ORAL_TABLET | Freq: Every day | ORAL | 6 refills | Status: DC

## 2017-10-23 NOTE — Progress Notes (Signed)
Chief Complaint  Patient presents with  . Follow-up    PVC's   History of Present Illness: 67 yo male with history of CAD, PVCs, HLD, GERD who is here today for cardiac follow up. He had a cardiac cath in May 2017 in the setting of unstable angina which showed a severe stenosis in the first obtuse marginal branch. A drug eluting stent was placed in the first OM. There was mild disease in the RCA and LAD. LV function was normal.  Frequent PVCs noted during his hospital stay. Cardiac monitor June 2017 with frequent PVCS and periods of sinus bradycardia. He was seen in the EP clinic by Dr. Graciela HusbandsKlein and he recommended the patient continue his beta blocker. He called our office complaining of palpitations at home mid December 2018 with HR up to the 150s. Cardiac monitor showed sinus bradycardia and episodes of atrial fibrillation/flutter. I reviewed his monitor with our EP team.   He is here today for follow up. The patient denies any chest pain, dyspnea, lower extremity edema, orthopnea, PND, dizziness, near syncope or syncope. He has had palpitations about once per week for the past month, each episode lasting several minutes. HR noted by pt to be over 150 when he feels the palpitations. Resting HR in the high 50s. No bleeding issues.   Primary Care Physician: Nila NephewGreen, Edwin, MD   Past Medical History:  Diagnosis Date  . CAD (coronary artery disease)    a. DES to OM1 02/2016.   . Cataract    bil cateracts  . Diverticulosis   . Esophageal stricture   . GERD (gastroesophageal reflux disease)   . Hemorrhoids   . Hiatal hernia   . Hyperlipidemia   . IBS (irritable bowel syndrome)   . Multiple food allergies    "lots of food allergies; beef, tuna, flounder, potatoes, garlic, tomatoes, apples, bananas, sesame seeds, shellfish  . Pre-diabetes   . PVC's (premature ventricular contractions)     Past Surgical History:  Procedure Laterality Date  . CARDIAC CATHETERIZATION N/A 02/28/2016   Procedure: Left Heart Cath and Coronary Angiography;  Surgeon: Iran OuchMuhammad A Arida, MD;  Location: [facility];  Service: Cardiovascular;  Laterality: N/A;  . CARDIAC CATHETERIZATION N/A 02/28/2016   Procedure: Coronary Stent Intervention;  Surgeon: Iran OuchMuhammad A Arida, MD;  Location: [facility];  Service: Cardiovascular;  Laterality: N/A;  . COLONOSCOPY    . CYSTOSCOPY WITH STENT PLACEMENT    . ESOPHAGOGASTRODUODENOSCOPY (EGD) WITH ESOPHAGEAL DILATION    . HEMORRHOID BANDING    . HERNIA REPAIR    . SURGERY SCROTAL / TESTICULAR Left ~ 1960   "hernia"  . TONSILLECTOMY    . UPPER GASTROINTESTINAL ENDOSCOPY      Current Outpatient Medications  Medication Sig Dispense Refill  . atorvastatin (LIPITOR) 40 MG tablet Take 1 tablet (40 mg total) by mouth daily. 30 tablet 5  . CIALIS 5 MG tablet Take 5 mg by mouth daily as needed for erectile dysfunction.   98  . halobetasol (ULTRAVATE) 0.05 % cream Apply 1 application topically daily.     . Magnesium 250 MG TABS Take 250 mg by mouth daily.    . metoprolol tartrate (LOPRESSOR) 25 MG tablet TAKE 1 TABLET(25 MG) BY MOUTH TWICE DAILY 180 tablet 3  . nitroGLYCERIN (NITROSTAT) 0.4 MG SL tablet PLACE 1 TABLET UNDER THE TONGUE EVERY 5 MINUTES AS NEEDED FOR CHEST PAIN. MAX 3 DOSES 75 tablet 0  . pantoprazole (PROTONIX) 40 MG tablet  TAKE 1 TABLET(40 MG) BY MOUTH DAILY 90 tablet 3  . Probiotic Product (PROBIOTIC DAILY PO) Take 1 capsule by mouth daily.     Marland Kitchen triamcinolone (NASACORT ALLERGY 24HR) 55 MCG/ACT AERO nasal inhaler Place 2 sprays into the nose daily as needed (allergies).     Marland Kitchen aspirin EC 81 MG tablet Take 1 tablet (81 mg total) by mouth daily. 90 tablet 3  . rivaroxaban (XARELTO) 20 MG TABS tablet Take 1 tablet (20 mg total) by mouth daily with supper. 30 tablet 6   No current facility-administered medications for this visit.     Allergies  Allergen Reactions  . Aleve [Naproxen Sodium]     Doesn't remember  . Apple Swelling  .  Banana Nausea And Vomiting  . Beef-Derived Products Nausea And Vomiting  . Erythromycin Swelling    Mouth swells  . Fish Allergy Swelling  . Garlic Swelling  . Sesame Seed (Diagnostic) Swelling  . Shellfish Allergy Other (See Comments)    Not sure of reaction found out about allergy through [place]  . Sulfonamide Derivatives Swelling    Mouth swelling  . Tomato Nausea And Vomiting  . Iodine Swelling    "I blow up like a balloon"  . Strawberry (Diagnostic) Rash    Social History   Socioeconomic History  . Marital status: Married    Spouse name: Not on file  . Number of children: Not on file  . Years of education: Not on file  . Highest education level: Not on file  Social Needs  . Financial resource strain: Not on file  . Food insecurity - worry: Not on file  . Food insecurity - inability: Not on file  . Transportation needs - medical: Not on file  . Transportation needs - non-medical: Not on file  Occupational History  . Not on file  Tobacco Use  . Smoking status: Former Smoker    Packs/day: 1.00    Years: 20.00    Pack years: 20.00    Types: Cigarettes  . Smokeless tobacco: Never Used  . Tobacco comment: "quit smoking cigarettes in the 1980s"  Substance and Sexual Activity  . Alcohol use: Yes    Alcohol/week: 0.0 oz    Comment: 02/27/2016 "nothing in the last few weeks; no regular pattern"  . Drug use: No  . Sexual activity: Yes  Other Topics Concern  . Not on file  Social History Narrative  . Not on file    Family History  Problem Relation Age of Onset  . Heart disease Mother   . Heart disease Father   . Colon cancer Neg Hx   . Esophageal cancer Neg Hx   . Rectal cancer Neg Hx   . Stomach cancer Neg Hx     Review of Systems:  As stated in the HPI and otherwise negative.   BP 122/70   Pulse 63   Ht 5\' 11"  (1.803 m)   Wt 174 lb 9.6 oz (79.2 kg)   SpO2 98%   BMI 24.35 kg/m   Physical Examination: General: Well developed, well nourished, NAD    HEENT: OP clear, mucus membranes moist  SKIN: warm, dry. No rashes. Neuro: No focal deficits  Musculoskeletal: Muscle strength 5/5 all ext  Psychiatric: Mood and affect normal  Neck: No JVD, no carotid bruits, no thyromegaly, no lymphadenopathy.  Lungs:Clear bilaterally, no wheezes, rhonci, crackles Cardiovascular: Regular rate and rhythm. No murmurs, gallops or rubs. Abdomen:Soft. Bowel sounds present. Non-tender.  Extremities: No lower extremity edema.  Pulses are 2 + in the bilateral DP/PT.  Cardiac cath 02/28/16:  Prox RCA lesion, 30% stenosed.  Mid RCA to Dist RCA lesion, 40% stenosed.  Prox LAD to Mid LAD lesion, 30% stenosed.  1st Mrg lesion, 99% stenosed. Post intervention, there is a 0% residual stenosis.  The left ventricular systolic function is normal.   1. Severe (99% ) one-vessel coronary artery disease involving proximal OM1 which is very large and supplies most of the left circumflex distribution. 2. Normal LV systolic function and mildly elevated left ventricular end-diastolic pressure. 3. Successful angioplasty and drug-eluting stent placement to OM 1.  Echo 01/15/17: - Left ventricle: The cavity size was normal. Wall thickness was   normal. Systolic function was normal. The estimated ejection   fraction was in the range of 60% to 65%. Wall motion was normal;   there were no regional wall motion abnormalities. Doppler   parameters are consistent with abnormal left ventricular   relaxation (grade 1 diastolic dysfunction). The E/e&' ratio is   between 8-15, suggesting indeterminate LV filling pressure. - Mitral valve: Mildly thickened leaflets . There was trivial   regurgitation. - Left atrium: The atrium was normal in size. - Atrial septum: No defect or patent foramen ovale was identified. - Tricuspid valve: There was mild regurgitation. - Pulmonary arteries: PA peak pressure: 38 mm Hg (S). - Inferior vena cava: The vessel was normal in size. The    respirophasic diameter changes were in the normal range (>= 50%),   consistent with normal central venous pressure.  Impressions:  - LVEF 60-65%, normal wall thickness, normal wall motion, grade 1   DD with indeterminate LV filling pressure, normal LA size, upper   normal RA size, trivial MR, mild TR, RVSP 38 mmHg, normal IVC.  EKG:  EKG is  ordered today. The ekg ordered today demonstrates Sinus, rate 63 bpm  Recent Labs: No results found for requested labs within last 8760 hours.   Lipid Panel    Component Value Date/Time   CHOL 124 (L) 04/24/2016 0847   TRIG 113 04/24/2016 0847   HDL 36 (L) 04/24/2016 0847   CHOLHDL 3.4 04/24/2016 0847   VLDL 23 04/24/2016 0847   LDLCALC 65 04/24/2016 0847     Wt Readings from Last 3 Encounters:  10/23/17 174 lb 9.6 oz (79.2 kg)  08/07/17 175 lb 9.6 oz (79.7 kg)  12/27/16 169 lb (76.7 kg)     Other studies Reviewed: Additional studies/ records that were reviewed today include: . Review of the above records demonstrates:   Assessment and Plan:   1. CAD without angina: He is having no chest pain suggestive of angina. He had a drug eluting stent placed in the obtuse marginal branch of the Circumflex in May 2017. Normal LV function by echo April 2018. Will continue statin and beta blocker. Now that we are starting Xarelto, will stop Plavix and start ASA 81 mg daily.   2. HLD: Lipids followed in primary care and well controlled per pt.   3. Atrial fibrillation, paroxysmal: Atrial fibrillation noted on monitor. He reports having several episodes over the past few weeks. Sinus today. CHADS VASC score of 1. I think he would benefit from anti-coagulation. Etiology and treatment of atrial fibrillatin reviewed in detail today with the patient.  -I will start Xarelto 20 mg daily -Check CBC and BMET today -Continue metoprolol 25 mg po BID. If he has recurrences of atrial fibrillation, will take an extra metoprolol. If his resting HR  does not  tolerate titration of meds, may need to send back to the EP clinic to see Dr. Graciela Husbands to discuss other options for medical therapy or ablation.   Current medicines are reviewed at length with the patient today.  The patient does not have concerns regarding medicines.  The following changes have been made:  no change  Labs/ tests ordered today include:   Orders Placed This Encounter  Procedures  . Basic Metabolic Panel (BMET)  . CBC  . EKG 12-Lead    Disposition:   FU with me in 1 months  Signed, Verne Carrow, MD 10/23/2017 4:25 PM    [facility] [address] [place], [place], [place]  [address] Phone: [phone]; Fax: [phone]

## 2017-10-23 NOTE — Patient Instructions (Addendum)
Medication Instructions:  Your physician has recommended you make the following change in your medication:  Start Xarelto 20 mg daily by mouth with the evening meal. Stop Clopidogrel. Start Aspirin 81 mg by mouth daily.    Labwork: none  Testing/Procedures: Lab work to be done today--BMP and CBC  Follow-Up: Your physician recommends that you schedule a follow-up appointment in: 6-8 weeks. Scheduled for March 4,2019 at 10:40    Any Other Special Instructions Will Be Listed Below (If Applicable).     If you need a refill on your cardiac medications before your next appointment, please call your pharmacy.

## 2017-10-24 ENCOUNTER — Encounter (HOSPITAL_COMMUNITY)
Admission: RE | Admit: 2017-10-24 | Discharge: 2017-10-24 | Disposition: A | Discharge status: Home / Self Care | Payer: Medicare Other | Source: Ambulatory Visit | Attending: Cardiovascular Disease | Admitting: Cardiovascular Disease

## 2017-10-24 LAB — CBC
HEMATOCRIT: 44.3 % (ref 37.5–51.0)
Hemoglobin: 14.5 g/dL (ref 13.0–17.7)
MCH: 29.2 pg (ref 26.6–33.0)
MCHC: 32.7 g/dL (ref 31.5–35.7)
MCV: 89 fL (ref 79–97)
PLATELETS: 256 10*3/uL (ref 150–379)
RBC: 4.96 x10E6/uL (ref 4.14–5.80)
RDW: 13.7 % (ref 12.3–15.4)
WBC: 8.9 10*3/uL (ref 3.4–10.8)

## 2017-10-24 LAB — BASIC METABOLIC PANEL
BUN / CREAT RATIO: 17 (ref 10–24)
BUN: 15 mg/dL (ref 8–27)
CO2: 25 mmol/L (ref 20–29)
CREATININE: 0.86 mg/dL (ref 0.76–1.27)
Calcium: 10.2 mg/dL (ref 8.6–10.2)
Chloride: 103 mmol/L (ref 96–106)
GFR calc non Af Amer: 90 mL/min/{1.73_m2} (ref >59)
GFR, EST AFRICAN AMERICAN: 104 mL/min/{1.73_m2} (ref >59)
GLUCOSE: 100 mg/dL — AB (ref 65–99)
Potassium: 5 mmol/L (ref 3.5–5.2)
SODIUM: 142 mmol/L (ref 134–144)

## 2017-10-27 ENCOUNTER — Encounter (HOSPITAL_COMMUNITY): Payer: Self-pay

## 2017-10-29 ENCOUNTER — Encounter (HOSPITAL_COMMUNITY)
Admission: RE | Admit: 2017-10-29 | Discharge: 2017-10-29 | Disposition: A | Discharge status: Home / Self Care | Payer: Medicare Other | Source: Ambulatory Visit | Attending: Cardiovascular Disease | Admitting: Cardiovascular Disease

## 2017-10-31 ENCOUNTER — Encounter (HOSPITAL_COMMUNITY)
Admission: RE | Admit: 2017-10-31 | Discharge: 2017-10-31 | Disposition: A | Discharge status: Home / Self Care | Payer: Self-pay | Source: Ambulatory Visit | Attending: Cardiovascular Disease | Admitting: Cardiovascular Disease

## 2017-11-03 ENCOUNTER — Encounter (HOSPITAL_COMMUNITY): Payer: Self-pay

## 2017-11-05 ENCOUNTER — Encounter (HOSPITAL_COMMUNITY): Payer: Self-pay

## 2017-11-07 ENCOUNTER — Encounter (HOSPITAL_COMMUNITY): Payer: Self-pay

## 2017-11-10 ENCOUNTER — Encounter (HOSPITAL_COMMUNITY)
Admission: RE | Admit: 2017-11-10 | Discharge: 2017-11-10 | Disposition: A | Discharge status: Home / Self Care | Payer: Self-pay | Source: Ambulatory Visit | Attending: Cardiovascular Disease | Admitting: Cardiovascular Disease

## 2017-11-12 ENCOUNTER — Encounter (HOSPITAL_COMMUNITY): Payer: Self-pay

## 2017-11-14 ENCOUNTER — Encounter (HOSPITAL_COMMUNITY): Payer: Self-pay

## 2017-11-14 DIAGNOSIS — I2510 Atherosclerotic heart disease of native coronary artery without angina pectoris: Secondary | ICD-10-CM | POA: Insufficient documentation

## 2017-11-17 ENCOUNTER — Encounter (HOSPITAL_COMMUNITY): Payer: Self-pay

## 2017-11-19 ENCOUNTER — Encounter (HOSPITAL_COMMUNITY): Payer: Self-pay

## 2017-11-21 ENCOUNTER — Encounter (HOSPITAL_COMMUNITY)
Admission: RE | Admit: 2017-11-21 | Discharge: 2017-11-21 | Disposition: A | Discharge status: Home / Self Care | Payer: Medicare Other | Source: Ambulatory Visit | Attending: Cardiovascular Disease | Admitting: Cardiovascular Disease

## 2017-11-24 ENCOUNTER — Encounter (HOSPITAL_COMMUNITY)
Admission: RE | Admit: 2017-11-24 | Discharge: 2017-11-24 | Disposition: A | Discharge status: Home / Self Care | Payer: Self-pay | Source: Ambulatory Visit | Attending: Cardiovascular Disease | Admitting: Cardiovascular Disease

## 2017-11-26 ENCOUNTER — Encounter (HOSPITAL_COMMUNITY)
Admission: RE | Admit: 2017-11-26 | Discharge: 2017-11-26 | Disposition: A | Discharge status: Home / Self Care | Payer: Medicare Other | Source: Ambulatory Visit | Attending: Cardiovascular Disease | Admitting: Cardiovascular Disease

## 2017-11-28 ENCOUNTER — Encounter (HOSPITAL_COMMUNITY): Payer: Self-pay

## 2017-12-01 ENCOUNTER — Encounter (HOSPITAL_COMMUNITY): Payer: Self-pay

## 2017-12-03 ENCOUNTER — Encounter (HOSPITAL_COMMUNITY): Payer: Self-pay

## 2017-12-05 ENCOUNTER — Encounter (HOSPITAL_COMMUNITY)
Admission: RE | Admit: 2017-12-05 | Discharge: 2017-12-05 | Disposition: A | Discharge status: Home / Self Care | Payer: Medicare Other | Source: Ambulatory Visit | Attending: Cardiovascular Disease | Admitting: Cardiovascular Disease

## 2017-12-08 ENCOUNTER — Encounter (HOSPITAL_COMMUNITY): Payer: Self-pay

## 2017-12-10 ENCOUNTER — Encounter (HOSPITAL_COMMUNITY): Payer: Self-pay

## 2017-12-12 ENCOUNTER — Encounter (HOSPITAL_COMMUNITY): Payer: Self-pay

## 2017-12-12 DIAGNOSIS — I2510 Atherosclerotic heart disease of native coronary artery without angina pectoris: Secondary | ICD-10-CM | POA: Insufficient documentation

## 2017-12-15 ENCOUNTER — Encounter (HOSPITAL_COMMUNITY): Payer: Self-pay

## 2017-12-15 ENCOUNTER — Encounter: Payer: Self-pay | Admitting: Cardiovascular Disease

## 2017-12-15 ENCOUNTER — Ambulatory Visit (INDEPENDENT_AMBULATORY_CARE_PROVIDER_SITE_OTHER): Payer: Medicare Other | Admitting: Cardiovascular Disease

## 2017-12-15 VITALS — BP 120/72 | HR 62 | Ht 71.0 in | Wt 173.8 lb

## 2017-12-15 DIAGNOSIS — I480 Paroxysmal atrial fibrillation: Secondary | ICD-10-CM

## 2017-12-15 DIAGNOSIS — E7800 Pure hypercholesterolemia, unspecified: Secondary | ICD-10-CM

## 2017-12-15 DIAGNOSIS — I2510 Atherosclerotic heart disease of native coronary artery without angina pectoris: Secondary | ICD-10-CM | POA: Diagnosis not present

## 2017-12-15 NOTE — Progress Notes (Signed)
Chief Complaint  Patient presents with  . Follow-up    atrial fib   History of Present Illness: 67 yo male with history of CAD, atrial fibrillation, PVCs, HLD, GERD who is here today for cardiac follow up. He had a cardiac cath in May 2017 in the setting of unstable angina which showed a severe stenosis in the first obtuse marginal branch. A drug eluting stent was placed in the first OM. There was mild disease in the RCA and LAD. LV function was normal.  Frequent PVCs noted during his hospital stay. Cardiac monitor June 2017 with frequent PVCS and periods of sinus bradycardia. He was seen in the EP clinic by Dr. Graciela Husbands and he recommended the patient continue his beta blocker. He called our office complaining of palpitations at home mid December 2018 with HR up to the 150s. Cardiac monitor showed sinus bradycardia and episodes of atrial fibrillation/flutter. He is now on a beta blocker and Xarelto.   He is here today for follow up. The patient denies any chest pain, dyspnea, palpitations, lower extremity edema, orthopnea, PND, dizziness, near syncope or syncope.   Primary Care Physician: Nila Nephew, MD   Past Medical History:  Diagnosis Date  . CAD (coronary artery disease)    a. DES to OM1 02/2016.   . Cataract    bil cateracts  . Diverticulosis   . Esophageal stricture   . GERD (gastroesophageal reflux disease)   . Hemorrhoids   . Hiatal hernia   . Hyperlipidemia   . IBS (irritable bowel syndrome)   . Multiple food allergies    "lots of food allergies; beef, tuna, flounder, potatoes, garlic, tomatoes, apples, bananas, sesame seeds, shellfish  . Pre-diabetes   . PVC's (premature ventricular contractions)     Past Surgical History:  Procedure Laterality Date  . CARDIAC CATHETERIZATION N/A 02/28/2016   Procedure: Left Heart Cath and Coronary Angiography;  Surgeon: Iran Ouch, MD;  [facility];  Service: Cardiovascular;  Laterality: N/A;  . CARDIAC  CATHETERIZATION N/A 02/28/2016   Procedure: Coronary Stent Intervention;  Surgeon: Iran Ouch, MD;  [facility];  Service: Cardiovascular;  Laterality: N/A;  . COLONOSCOPY    . CYSTOSCOPY WITH STENT PLACEMENT    . ESOPHAGOGASTRODUODENOSCOPY (EGD) WITH ESOPHAGEAL DILATION    . HEMORRHOID BANDING    . HERNIA REPAIR    . SURGERY SCROTAL / TESTICULAR Left ~ 1960   "hernia"  . TONSILLECTOMY    . UPPER GASTROINTESTINAL ENDOSCOPY      Current Outpatient Medications  Medication Sig Dispense Refill  . aspirin EC 81 MG tablet Take 1 tablet (81 mg total) by mouth daily. 90 tablet 3  . atorvastatin (LIPITOR) 40 MG tablet Take 1 tablet (40 mg total) by mouth daily. 30 tablet 5  . CIALIS 5 MG tablet Take 5 mg by mouth daily as needed for erectile dysfunction.   98  . halobetasol (ULTRAVATE) 0.05 % cream Apply 1 application topically daily.     . Magnesium 250 MG TABS Take 250 mg by mouth daily.    . metoprolol tartrate (LOPRESSOR) 25 MG tablet TAKE 1 TABLET(25 MG) BY MOUTH TWICE DAILY 180 tablet 3  . nitroGLYCERIN (NITROSTAT) 0.4 MG SL tablet PLACE 1 TABLET UNDER THE TONGUE EVERY 5 MINUTES AS NEEDED FOR CHEST PAIN. MAX 3 DOSES 75 tablet 0  . pantoprazole (PROTONIX) 40 MG tablet TAKE 1 TABLET(40 MG) BY MOUTH DAILY 90 tablet 3  . Probiotic Product (  PROBIOTIC DAILY PO) Take 1 capsule by mouth daily.     . rivaroxaban (XARELTO) 20 MG TABS tablet Take 1 tablet (20 mg total) by mouth daily with supper. 30 tablet 6  . triamcinolone (NASACORT ALLERGY 24HR) 55 MCG/ACT AERO nasal inhaler Place 2 sprays into the nose daily as needed (allergies).      No current facility-administered medications for this visit.     Allergies  Allergen Reactions  . Iodinated Diagnostic Agents Swelling  . Sulfa Antibiotics Swelling    Mouth swelling  . Aleve [Naproxen Sodium]     Doesn't remember  . Apple Swelling  . Banana Nausea And Vomiting  . Beef-Derived Products Nausea And Vomiting  .  Erythromycin Swelling    Mouth swells  . Fish Allergy Swelling  . Garlic Swelling  . Sesame Seed (Diagnostic) Swelling  . Shellfish Allergy Other (See Comments)    Not sure of reaction found out about allergy through [place]  . Sulfonamide Derivatives Swelling    Mouth swelling  . Tomato Nausea And Vomiting  . Iodine Swelling    "I blow up like a balloon"  . Strawberry (Diagnostic) Rash    Social History   Socioeconomic History  . Marital status: Married    Spouse name: Not on file  . Number of children: Not on file  . Years of education: Not on file  . Highest education level: Not on file  Social Needs  . Financial resource strain: Not on file  . Food insecurity - worry: Not on file  . Food insecurity - inability: Not on file  . Transportation needs - medical: Not on file  . Transportation needs - non-medical: Not on file  Occupational History  . Not on file  Tobacco Use  . Smoking status: Former Smoker    Packs/day: 1.00    Years: 20.00    Pack years: 20.00    Types: Cigarettes  . Smokeless tobacco: Never Used  . Tobacco comment: "quit smoking cigarettes in the 1980s"  Substance and Sexual Activity  . Alcohol use: Yes    Alcohol/week: 0.0 oz    Comment: 02/27/2016 "nothing in the last few weeks; no regular pattern"  . Drug use: No  . Sexual activity: Yes  Other Topics Concern  . Not on file  Social History Narrative  . Not on file    Family History  Problem Relation Age of Onset  . Heart disease Mother   . Heart disease Father   . Colon cancer Neg Hx   . Esophageal cancer Neg Hx   . Rectal cancer Neg Hx   . Stomach cancer Neg Hx     Review of Systems:  As stated in the HPI and otherwise negative.   BP 120/72   Pulse 62   Ht 5\' 11"  (1.803 m)   Wt 173 lb 12.8 oz (78.8 kg)   SpO2 98%   BMI 24.24 kg/m   Physical Examination: General: Well developed, well nourished, NAD  HEENT: OP clear, mucus membranes moist  SKIN: warm, dry. No rashes. Neuro:  No focal deficits  Musculoskeletal: Muscle strength 5/5 all ext  Psychiatric: Mood and affect normal  Neck: No JVD, no carotid bruits, no thyromegaly, no lymphadenopathy.  Lungs:Clear bilaterally, no wheezes, rhonci, crackles Cardiovascular: Regular rate and rhythm. No murmurs, gallops or rubs. Abdomen:Soft. Bowel sounds present. Non-tender.  Extremities: No lower extremity edema. Pulses are 2 + in the bilateral DP/PT.  Cardiac cath 02/28/16:  Prox RCA lesion, 30% stenosed.  Mid RCA to Dist RCA lesion, 40% stenosed.  Prox LAD to Mid LAD lesion, 30% stenosed.  1st Mrg lesion, 99% stenosed. Post intervention, there is a 0% residual stenosis.  The left ventricular systolic function is normal.   1. Severe (99% ) one-vessel coronary artery disease involving proximal OM1 which is very large and supplies most of the left circumflex distribution. 2. Normal LV systolic function and mildly elevated left ventricular end-diastolic pressure. 3. Successful angioplasty and drug-eluting stent placement to OM 1.  Echo 01/15/17: - Left ventricle: The cavity size was normal. Wall thickness was   normal. Systolic function was normal. The estimated ejection   fraction was in the range of 60% to 65%. Wall motion was normal;   there were no regional wall motion abnormalities. Doppler   parameters are consistent with abnormal left ventricular   relaxation (grade 1 diastolic dysfunction). The E/e&' ratio is   between 8-15, suggesting indeterminate LV filling pressure. - Mitral valve: Mildly thickened leaflets . There was trivial   regurgitation. - Left atrium: The atrium was normal in size. - Atrial septum: No defect or patent foramen ovale was identified. - Tricuspid valve: There was mild regurgitation. - Pulmonary arteries: PA peak pressure: 38 mm Hg (S). - Inferior vena cava: The vessel was normal in size. The   respirophasic diameter changes were in the normal range (>= 50%),   consistent with  normal central venous pressure.  Impressions:  - LVEF 60-65%, normal wall thickness, normal wall motion, grade 1   DD with indeterminate LV filling pressure, normal LA size, upper   normal RA size, trivial MR, mild TR, RVSP 38 mmHg, normal IVC.  EKG:  EKG is not ordered today. The ekg ordered today demonstrates   Recent Labs: 10/23/2017: BUN 15; Creatinine, Ser 0.86; Hemoglobin 14.5; Platelets 256; Potassium 5.0; Sodium 142   Lipid Panel    Component Value Date/Time   CHOL 124 (L) 04/24/2016 0847   TRIG 113 04/24/2016 0847   HDL 36 (L) 04/24/2016 0847   CHOLHDL 3.4 04/24/2016 0847   VLDL 23 04/24/2016 0847   LDLCALC 65 04/24/2016 0847     Wt Readings from Last 3 Encounters:  12/15/17 173 lb 12.8 oz (78.8 kg)  10/23/17 174 lb 9.6 oz (79.2 kg)  08/07/17 175 lb 9.6 oz (79.7 kg)     Other studies Reviewed: Additional studies/ records that were reviewed today include: . Review of the above records demonstrates:   Assessment and Plan:   1. CAD without angina: No chest pain suggestive of angina. He had a drug eluting stent placed in the obtuse marginal branch of the Circumflex in May 2017. Normal LV function by echo April 2018. Continue statin, beta blocker and ASA.   2. HLD: Lipids followed in primary care. Continue statin.   3. Atrial fibrillation, paroxysmal: He appears to be in sinus today. He is tolerating the beta blocker. No bleeding on Xarelto. (CHADS VASC score of 1). If he has recurrences of atrial fibrillation, will take an extra metoprolol. No changes today.   Current medicines are reviewed at length with the patient today.  The patient does not have concerns regarding medicines.  The following changes have been made:  no change  Labs/ tests ordered today include:   Orders Placed This Encounter  Procedures  . EKG 12-Lead    Disposition:   FU with me in 1 months  Signed, Verne Carrowhristopher Amada Hallisey, MD 12/15/2017 11:06 AM    [facility]  [place], [place], [place]  [address] Phone: [phone]; Fax: [phone]

## 2017-12-15 NOTE — Patient Instructions (Signed)

## 2017-12-17 ENCOUNTER — Encounter (HOSPITAL_COMMUNITY): Payer: Self-pay

## 2017-12-19 ENCOUNTER — Encounter (HOSPITAL_COMMUNITY)
Admission: RE | Admit: 2017-12-19 | Discharge: 2017-12-19 | Disposition: A | Discharge status: Home / Self Care | Payer: Medicare Other | Source: Ambulatory Visit | Attending: Cardiovascular Disease | Admitting: Cardiovascular Disease

## 2017-12-22 ENCOUNTER — Encounter (HOSPITAL_COMMUNITY): Payer: Self-pay

## 2017-12-24 ENCOUNTER — Encounter (HOSPITAL_COMMUNITY): Payer: Self-pay

## 2017-12-26 ENCOUNTER — Encounter (HOSPITAL_COMMUNITY): Payer: Self-pay

## 2017-12-29 ENCOUNTER — Encounter (HOSPITAL_COMMUNITY): Payer: Self-pay

## 2017-12-31 ENCOUNTER — Encounter (HOSPITAL_COMMUNITY): Payer: Self-pay

## 2018-01-02 ENCOUNTER — Encounter (HOSPITAL_COMMUNITY)
Admission: RE | Admit: 2018-01-02 | Discharge: 2018-01-02 | Disposition: A | Discharge status: Home / Self Care | Payer: Medicare Other | Source: Ambulatory Visit | Attending: Cardiovascular Disease | Admitting: Cardiovascular Disease

## 2018-01-05 ENCOUNTER — Encounter (HOSPITAL_COMMUNITY)
Admission: RE | Admit: 2018-01-05 | Discharge: 2018-01-05 | Disposition: A | Discharge status: Home / Self Care | Payer: Medicare Other | Source: Ambulatory Visit | Attending: Cardiovascular Disease | Admitting: Cardiovascular Disease

## 2018-01-05 NOTE — Progress Notes (Signed)
Nutrition Note Lab Results  Component Value Date   HGBA1C 6.1 (H) 02/27/2016   Pt concerned re: pre-diabetes. Per discussion, pt's A1c was elevated 1 year ago and pt has an extended family history of diabetes (first cousins). Pt concerned about pre-diabetes diet. Pre-diabetes discussed. Pt given a handout re: foods with carbohydrates. Pt expressed understanding of information reviewed. Continue client-centered nutrition education by RD as part of interdisciplinary care.  Monitor and evaluate progress toward nutrition goal with team.  Travis PlumbEdna Raydon Chappuis, M.Ed, RD, LDN, CDE 01/05/2018 8:29 AM

## 2018-01-07 ENCOUNTER — Encounter (HOSPITAL_COMMUNITY): Payer: Self-pay

## 2018-01-09 ENCOUNTER — Encounter (HOSPITAL_COMMUNITY)
Admission: RE | Admit: 2018-01-09 | Discharge: 2018-01-09 | Disposition: A | Discharge status: Home / Self Care | Payer: Medicare Other | Source: Ambulatory Visit | Attending: Cardiovascular Disease | Admitting: Cardiovascular Disease

## 2018-01-12 ENCOUNTER — Encounter (HOSPITAL_COMMUNITY)
Admission: RE | Admit: 2018-01-12 | Discharge: 2018-01-12 | Disposition: A | Discharge status: Home / Self Care | Payer: Medicare Other | Source: Ambulatory Visit | Attending: Cardiovascular Disease | Admitting: Cardiovascular Disease

## 2018-01-12 DIAGNOSIS — I2510 Atherosclerotic heart disease of native coronary artery without angina pectoris: Secondary | ICD-10-CM | POA: Insufficient documentation

## 2018-01-14 ENCOUNTER — Encounter (HOSPITAL_COMMUNITY)
Admission: RE | Admit: 2018-01-14 | Discharge: 2018-01-14 | Disposition: A | Discharge status: Home / Self Care | Payer: Medicare Other | Source: Ambulatory Visit | Attending: Cardiovascular Disease | Admitting: Cardiovascular Disease

## 2018-01-16 ENCOUNTER — Encounter (HOSPITAL_COMMUNITY): Payer: Self-pay

## 2018-01-19 ENCOUNTER — Encounter (HOSPITAL_COMMUNITY): Payer: Self-pay

## 2018-01-21 ENCOUNTER — Encounter (HOSPITAL_COMMUNITY)
Admission: RE | Admit: 2018-01-21 | Discharge: 2018-01-21 | Disposition: A | Discharge status: Home / Self Care | Payer: Self-pay | Source: Ambulatory Visit | Attending: Cardiovascular Disease | Admitting: Cardiovascular Disease

## 2018-01-23 ENCOUNTER — Encounter (HOSPITAL_COMMUNITY): Payer: Self-pay

## 2018-01-26 ENCOUNTER — Encounter (HOSPITAL_COMMUNITY): Payer: Self-pay

## 2018-01-28 ENCOUNTER — Encounter (HOSPITAL_COMMUNITY): Payer: Self-pay

## 2018-01-30 ENCOUNTER — Encounter (HOSPITAL_COMMUNITY): Payer: Self-pay

## 2018-02-02 ENCOUNTER — Encounter (HOSPITAL_COMMUNITY)
Admission: RE | Admit: 2018-02-02 | Discharge: 2018-02-02 | Disposition: A | Discharge status: Home / Self Care | Payer: Medicare Other | Source: Ambulatory Visit | Attending: Cardiovascular Disease | Admitting: Cardiovascular Disease

## 2018-02-04 ENCOUNTER — Encounter (HOSPITAL_COMMUNITY): Payer: Self-pay

## 2018-02-06 ENCOUNTER — Encounter (HOSPITAL_COMMUNITY)
Admission: RE | Admit: 2018-02-06 | Discharge: 2018-02-06 | Disposition: A | Discharge status: Home / Self Care | Payer: Medicare Other | Source: Ambulatory Visit | Attending: Cardiovascular Disease | Admitting: Cardiovascular Disease

## 2018-02-09 ENCOUNTER — Encounter (HOSPITAL_COMMUNITY): Payer: Self-pay

## 2018-02-11 ENCOUNTER — Encounter (HOSPITAL_COMMUNITY)
Admission: RE | Admit: 2018-02-11 | Discharge: 2018-02-11 | Disposition: A | Discharge status: Home / Self Care | Payer: Self-pay | Source: Ambulatory Visit | Attending: Cardiovascular Disease | Admitting: Cardiovascular Disease

## 2018-02-11 DIAGNOSIS — I2510 Atherosclerotic heart disease of native coronary artery without angina pectoris: Secondary | ICD-10-CM | POA: Insufficient documentation

## 2018-02-11 DIAGNOSIS — E7800 Pure hypercholesterolemia, unspecified: Secondary | ICD-10-CM | POA: Insufficient documentation

## 2018-02-11 DIAGNOSIS — I480 Paroxysmal atrial fibrillation: Secondary | ICD-10-CM | POA: Insufficient documentation

## 2018-02-13 ENCOUNTER — Encounter (HOSPITAL_COMMUNITY): Payer: Self-pay

## 2018-02-16 ENCOUNTER — Encounter (HOSPITAL_COMMUNITY): Payer: Self-pay

## 2018-02-18 ENCOUNTER — Encounter (HOSPITAL_COMMUNITY): Payer: Self-pay

## 2018-02-20 ENCOUNTER — Encounter (HOSPITAL_COMMUNITY): Payer: Self-pay

## 2018-02-23 ENCOUNTER — Encounter (HOSPITAL_COMMUNITY)
Admission: RE | Admit: 2018-02-23 | Discharge: 2018-02-23 | Disposition: A | Discharge status: Home / Self Care | Payer: Self-pay | Source: Ambulatory Visit | Attending: Cardiovascular Disease | Admitting: Cardiovascular Disease

## 2018-02-25 ENCOUNTER — Encounter (HOSPITAL_COMMUNITY)
Admission: RE | Admit: 2018-02-25 | Discharge: 2018-02-25 | Disposition: A | Discharge status: Home / Self Care | Payer: Self-pay | Source: Ambulatory Visit | Attending: Cardiovascular Disease | Admitting: Cardiovascular Disease

## 2018-02-27 ENCOUNTER — Encounter (HOSPITAL_COMMUNITY)
Admission: RE | Admit: 2018-02-27 | Discharge: 2018-02-27 | Disposition: A | Discharge status: Home / Self Care | Payer: Self-pay | Source: Ambulatory Visit | Attending: Cardiovascular Disease | Admitting: Cardiovascular Disease

## 2018-03-02 ENCOUNTER — Encounter (HOSPITAL_COMMUNITY)
Admission: RE | Admit: 2018-03-02 | Discharge: 2018-03-02 | Disposition: A | Discharge status: Home / Self Care | Payer: Self-pay | Source: Ambulatory Visit | Attending: Cardiovascular Disease | Admitting: Cardiovascular Disease

## 2018-03-04 ENCOUNTER — Encounter (HOSPITAL_COMMUNITY): Payer: Self-pay

## 2018-03-06 ENCOUNTER — Encounter (HOSPITAL_COMMUNITY): Payer: Self-pay

## 2018-03-11 ENCOUNTER — Encounter (HOSPITAL_COMMUNITY)
Admission: RE | Admit: 2018-03-11 | Discharge: 2018-03-11 | Disposition: A | Discharge status: Home / Self Care | Payer: Self-pay | Source: Ambulatory Visit | Attending: Cardiovascular Disease | Admitting: Cardiovascular Disease

## 2018-03-13 ENCOUNTER — Encounter (HOSPITAL_COMMUNITY)
Admission: RE | Admit: 2018-03-13 | Discharge: 2018-03-13 | Disposition: A | Discharge status: Home / Self Care | Payer: Self-pay | Source: Ambulatory Visit | Attending: Cardiovascular Disease | Admitting: Cardiovascular Disease

## 2018-03-16 ENCOUNTER — Encounter (HOSPITAL_COMMUNITY)
Admission: RE | Admit: 2018-03-16 | Discharge: 2018-03-16 | Disposition: A | Discharge status: Home / Self Care | Payer: Medicare Other | Source: Ambulatory Visit | Attending: Cardiovascular Disease | Admitting: Cardiovascular Disease

## 2018-03-16 DIAGNOSIS — I2510 Atherosclerotic heart disease of native coronary artery without angina pectoris: Secondary | ICD-10-CM | POA: Insufficient documentation

## 2018-03-16 DIAGNOSIS — E7800 Pure hypercholesterolemia, unspecified: Secondary | ICD-10-CM | POA: Insufficient documentation

## 2018-03-16 DIAGNOSIS — I480 Paroxysmal atrial fibrillation: Secondary | ICD-10-CM | POA: Insufficient documentation

## 2018-03-18 ENCOUNTER — Encounter (HOSPITAL_COMMUNITY): Payer: Self-pay

## 2018-03-20 ENCOUNTER — Encounter (HOSPITAL_COMMUNITY): Payer: Self-pay

## 2018-03-23 ENCOUNTER — Encounter (HOSPITAL_COMMUNITY)
Admission: RE | Admit: 2018-03-23 | Discharge: 2018-03-23 | Disposition: A | Discharge status: Home / Self Care | Payer: Medicare Other | Source: Ambulatory Visit | Attending: Cardiovascular Disease | Admitting: Cardiovascular Disease

## 2018-03-25 ENCOUNTER — Encounter (HOSPITAL_COMMUNITY): Payer: Self-pay

## 2018-03-27 ENCOUNTER — Encounter (HOSPITAL_COMMUNITY)
Admission: RE | Admit: 2018-03-27 | Discharge: 2018-03-27 | Disposition: A | Discharge status: Home / Self Care | Payer: Medicare Other | Source: Ambulatory Visit | Attending: Cardiovascular Disease | Admitting: Cardiovascular Disease

## 2018-03-30 ENCOUNTER — Encounter (HOSPITAL_COMMUNITY)
Admission: RE | Admit: 2018-03-30 | Discharge: 2018-03-30 | Disposition: A | Discharge status: Home / Self Care | Payer: Medicare Other | Source: Ambulatory Visit | Attending: Cardiovascular Disease | Admitting: Cardiovascular Disease

## 2018-04-01 ENCOUNTER — Encounter (HOSPITAL_COMMUNITY)
Admission: RE | Admit: 2018-04-01 | Discharge: 2018-04-01 | Disposition: A | Discharge status: Home / Self Care | Payer: Medicare Other | Source: Ambulatory Visit | Attending: Cardiovascular Disease | Admitting: Cardiovascular Disease

## 2018-04-03 ENCOUNTER — Encounter (HOSPITAL_COMMUNITY): Payer: Self-pay

## 2018-04-06 ENCOUNTER — Encounter (HOSPITAL_COMMUNITY): Payer: Self-pay

## 2018-04-06 ENCOUNTER — Other Ambulatory Visit: Payer: Self-pay | Admitting: Cardiovascular Disease

## 2018-04-06 NOTE — Telephone Encounter (Signed)
Pt is a 67 yr old male who saw Dr Clifton JamesMcAlhany on 12/15/17. Wt. at that visit was 78.8Kg. On 10/23/17 SCr was 0.86. CrCl is 7392mL/min. Will refill Xarelto 20mg  QD.

## 2018-04-08 ENCOUNTER — Encounter (HOSPITAL_COMMUNITY): Payer: Self-pay

## 2018-04-10 ENCOUNTER — Encounter (HOSPITAL_COMMUNITY): Payer: Self-pay

## 2018-04-13 ENCOUNTER — Encounter (HOSPITAL_COMMUNITY)
Admission: RE | Admit: 2018-04-13 | Discharge: 2018-04-13 | Disposition: A | Discharge status: Home / Self Care | Payer: Medicare Other | Source: Ambulatory Visit | Attending: Cardiovascular Disease | Admitting: Cardiovascular Disease

## 2018-04-13 DIAGNOSIS — I2510 Atherosclerotic heart disease of native coronary artery without angina pectoris: Secondary | ICD-10-CM | POA: Insufficient documentation

## 2018-04-13 DIAGNOSIS — E7800 Pure hypercholesterolemia, unspecified: Secondary | ICD-10-CM | POA: Insufficient documentation

## 2018-04-13 DIAGNOSIS — I480 Paroxysmal atrial fibrillation: Secondary | ICD-10-CM | POA: Insufficient documentation

## 2018-04-15 ENCOUNTER — Encounter (HOSPITAL_COMMUNITY): Payer: Self-pay

## 2018-04-17 ENCOUNTER — Encounter (HOSPITAL_COMMUNITY)
Admission: RE | Admit: 2018-04-17 | Discharge: 2018-04-17 | Disposition: A | Discharge status: Home / Self Care | Payer: Medicare Other | Source: Ambulatory Visit | Attending: Cardiovascular Disease | Admitting: Cardiovascular Disease

## 2018-04-20 ENCOUNTER — Encounter (HOSPITAL_COMMUNITY)
Admission: RE | Admit: 2018-04-20 | Discharge: 2018-04-20 | Disposition: A | Discharge status: Home / Self Care | Payer: Medicare Other | Source: Ambulatory Visit | Attending: Cardiovascular Disease | Admitting: Cardiovascular Disease

## 2018-04-22 ENCOUNTER — Encounter (HOSPITAL_COMMUNITY): Payer: Self-pay

## 2018-04-24 ENCOUNTER — Encounter (HOSPITAL_COMMUNITY)
Admission: RE | Admit: 2018-04-24 | Discharge: 2018-04-24 | Disposition: A | Discharge status: Home / Self Care | Payer: Medicare Other | Source: Ambulatory Visit | Attending: Cardiovascular Disease | Admitting: Cardiovascular Disease

## 2018-04-27 ENCOUNTER — Encounter (HOSPITAL_COMMUNITY): Payer: Self-pay

## 2018-04-29 ENCOUNTER — Encounter (HOSPITAL_COMMUNITY)
Admission: RE | Admit: 2018-04-29 | Discharge: 2018-04-29 | Disposition: A | Discharge status: Home / Self Care | Payer: Medicare Other | Source: Ambulatory Visit | Attending: Cardiovascular Disease | Admitting: Cardiovascular Disease

## 2018-05-01 ENCOUNTER — Encounter (HOSPITAL_COMMUNITY)
Admission: RE | Admit: 2018-05-01 | Discharge: 2018-05-01 | Disposition: A | Discharge status: Home / Self Care | Payer: Medicare Other | Source: Ambulatory Visit | Attending: Cardiovascular Disease | Admitting: Cardiovascular Disease

## 2018-05-04 ENCOUNTER — Encounter (HOSPITAL_COMMUNITY): Payer: Self-pay

## 2018-05-06 ENCOUNTER — Encounter (HOSPITAL_COMMUNITY): Payer: Self-pay

## 2018-05-08 ENCOUNTER — Encounter (HOSPITAL_COMMUNITY): Payer: Self-pay

## 2018-05-11 ENCOUNTER — Encounter (HOSPITAL_COMMUNITY)
Admission: RE | Admit: 2018-05-11 | Discharge: 2018-05-11 | Disposition: A | Discharge status: Home / Self Care | Payer: Medicare Other | Source: Ambulatory Visit | Attending: Cardiovascular Disease | Admitting: Cardiovascular Disease

## 2018-05-13 ENCOUNTER — Encounter (HOSPITAL_COMMUNITY)
Admission: RE | Admit: 2018-05-13 | Discharge: 2018-05-13 | Disposition: A | Discharge status: Home / Self Care | Payer: Medicare Other | Source: Ambulatory Visit | Attending: Cardiovascular Disease | Admitting: Cardiovascular Disease

## 2018-05-15 ENCOUNTER — Encounter (HOSPITAL_COMMUNITY): Payer: Self-pay

## 2018-05-15 DIAGNOSIS — E7800 Pure hypercholesterolemia, unspecified: Secondary | ICD-10-CM | POA: Insufficient documentation

## 2018-05-15 DIAGNOSIS — I2510 Atherosclerotic heart disease of native coronary artery without angina pectoris: Secondary | ICD-10-CM | POA: Insufficient documentation

## 2018-05-15 DIAGNOSIS — I480 Paroxysmal atrial fibrillation: Secondary | ICD-10-CM | POA: Insufficient documentation

## 2018-05-18 ENCOUNTER — Encounter (HOSPITAL_COMMUNITY): Payer: Self-pay

## 2018-05-20 ENCOUNTER — Encounter (HOSPITAL_COMMUNITY): Payer: Self-pay

## 2018-05-22 ENCOUNTER — Encounter (HOSPITAL_COMMUNITY): Payer: Self-pay

## 2018-05-25 ENCOUNTER — Encounter (HOSPITAL_COMMUNITY): Payer: Self-pay

## 2018-05-27 ENCOUNTER — Encounter (HOSPITAL_COMMUNITY): Payer: Self-pay

## 2018-05-29 ENCOUNTER — Encounter (HOSPITAL_COMMUNITY): Payer: Self-pay

## 2018-06-01 ENCOUNTER — Encounter (HOSPITAL_COMMUNITY)
Admission: RE | Admit: 2018-06-01 | Discharge: 2018-06-01 | Disposition: A | Discharge status: Home / Self Care | Payer: Medicare Other | Source: Ambulatory Visit | Attending: Cardiovascular Disease | Admitting: Cardiovascular Disease

## 2018-06-03 ENCOUNTER — Encounter (HOSPITAL_COMMUNITY)
Admission: RE | Admit: 2018-06-03 | Discharge: 2018-06-03 | Disposition: A | Discharge status: Home / Self Care | Payer: Medicare Other | Source: Ambulatory Visit | Attending: Cardiovascular Disease | Admitting: Cardiovascular Disease

## 2018-06-05 ENCOUNTER — Encounter (HOSPITAL_COMMUNITY): Payer: Self-pay

## 2018-06-08 ENCOUNTER — Encounter (HOSPITAL_COMMUNITY)
Admission: RE | Admit: 2018-06-08 | Discharge: 2018-06-08 | Disposition: A | Discharge status: Home / Self Care | Payer: Medicare Other | Source: Ambulatory Visit | Attending: Cardiovascular Disease | Admitting: Cardiovascular Disease

## 2018-06-10 ENCOUNTER — Encounter (HOSPITAL_COMMUNITY)
Admission: RE | Admit: 2018-06-10 | Discharge: 2018-06-10 | Disposition: A | Discharge status: Home / Self Care | Payer: Medicare Other | Source: Ambulatory Visit | Attending: Cardiovascular Disease | Admitting: Cardiovascular Disease

## 2018-06-12 ENCOUNTER — Encounter (HOSPITAL_COMMUNITY): Payer: Self-pay

## 2018-06-17 ENCOUNTER — Telehealth: Payer: Self-pay | Admitting: Cardiovascular Disease

## 2018-06-17 ENCOUNTER — Encounter (HOSPITAL_COMMUNITY): Payer: Self-pay

## 2018-06-17 DIAGNOSIS — E7800 Pure hypercholesterolemia, unspecified: Secondary | ICD-10-CM | POA: Insufficient documentation

## 2018-06-17 DIAGNOSIS — I2510 Atherosclerotic heart disease of native coronary artery without angina pectoris: Secondary | ICD-10-CM | POA: Insufficient documentation

## 2018-06-17 DIAGNOSIS — I480 Paroxysmal atrial fibrillation: Secondary | ICD-10-CM | POA: Insufficient documentation

## 2018-06-17 NOTE — Telephone Encounter (Signed)
Walk In pt Form-Sealed envelope dropped off. Placed in Dr.McAlhany doc box.

## 2018-06-19 ENCOUNTER — Encounter (HOSPITAL_COMMUNITY): Payer: Self-pay

## 2018-06-22 ENCOUNTER — Encounter (HOSPITAL_COMMUNITY)
Admission: RE | Admit: 2018-06-22 | Discharge: 2018-06-22 | Disposition: A | Discharge status: Home / Self Care | Payer: Self-pay | Source: Ambulatory Visit | Attending: Cardiovascular Disease | Admitting: Cardiovascular Disease

## 2018-06-24 ENCOUNTER — Encounter (HOSPITAL_COMMUNITY)
Admission: RE | Admit: 2018-06-24 | Discharge: 2018-06-24 | Disposition: A | Discharge status: Home / Self Care | Payer: Medicare Other | Source: Ambulatory Visit | Attending: Cardiovascular Disease | Admitting: Cardiovascular Disease

## 2018-06-26 ENCOUNTER — Encounter (HOSPITAL_COMMUNITY)
Admission: RE | Admit: 2018-06-26 | Discharge: 2018-06-26 | Disposition: A | Discharge status: Home / Self Care | Payer: Medicare Other | Source: Ambulatory Visit | Attending: Cardiovascular Disease | Admitting: Cardiovascular Disease

## 2018-06-29 ENCOUNTER — Encounter (HOSPITAL_COMMUNITY): Payer: Self-pay

## 2018-07-01 ENCOUNTER — Encounter (HOSPITAL_COMMUNITY)
Admission: RE | Admit: 2018-07-01 | Discharge: 2018-07-01 | Disposition: A | Discharge status: Home / Self Care | Payer: Medicare Other | Source: Ambulatory Visit | Attending: Cardiovascular Disease | Admitting: Cardiovascular Disease

## 2018-07-03 ENCOUNTER — Encounter (HOSPITAL_COMMUNITY)
Admission: RE | Admit: 2018-07-03 | Discharge: 2018-07-03 | Disposition: A | Discharge status: Home / Self Care | Payer: Self-pay | Source: Ambulatory Visit | Attending: Cardiovascular Disease | Admitting: Cardiovascular Disease

## 2018-07-06 ENCOUNTER — Encounter (HOSPITAL_COMMUNITY)
Admission: RE | Admit: 2018-07-06 | Discharge: 2018-07-06 | Disposition: A | Discharge status: Home / Self Care | Payer: Medicare Other | Source: Ambulatory Visit | Attending: Cardiovascular Disease | Admitting: Cardiovascular Disease

## 2018-07-08 ENCOUNTER — Encounter (HOSPITAL_COMMUNITY)
Admission: RE | Admit: 2018-07-08 | Discharge: 2018-07-08 | Disposition: A | Discharge status: Home / Self Care | Payer: Medicare Other | Source: Ambulatory Visit | Attending: Cardiovascular Disease | Admitting: Cardiovascular Disease

## 2018-07-10 ENCOUNTER — Encounter (HOSPITAL_COMMUNITY)
Admission: RE | Admit: 2018-07-10 | Discharge: 2018-07-10 | Disposition: A | Discharge status: Home / Self Care | Payer: Medicare Other | Source: Ambulatory Visit | Attending: Cardiovascular Disease | Admitting: Cardiovascular Disease

## 2018-07-13 ENCOUNTER — Encounter (HOSPITAL_COMMUNITY): Payer: Self-pay

## 2018-07-15 ENCOUNTER — Encounter (HOSPITAL_COMMUNITY): Payer: Self-pay | Attending: Cardiovascular Disease

## 2018-07-15 DIAGNOSIS — E7800 Pure hypercholesterolemia, unspecified: Secondary | ICD-10-CM | POA: Insufficient documentation

## 2018-07-15 DIAGNOSIS — I480 Paroxysmal atrial fibrillation: Secondary | ICD-10-CM | POA: Insufficient documentation

## 2018-07-15 DIAGNOSIS — I2510 Atherosclerotic heart disease of native coronary artery without angina pectoris: Secondary | ICD-10-CM | POA: Insufficient documentation

## 2018-07-17 ENCOUNTER — Encounter (HOSPITAL_COMMUNITY): Payer: Self-pay

## 2018-07-20 ENCOUNTER — Encounter (HOSPITAL_COMMUNITY): Payer: Self-pay

## 2018-07-22 ENCOUNTER — Encounter (HOSPITAL_COMMUNITY): Payer: Self-pay

## 2018-07-24 ENCOUNTER — Encounter (HOSPITAL_COMMUNITY): Payer: Self-pay

## 2018-07-27 ENCOUNTER — Encounter (HOSPITAL_COMMUNITY): Payer: Self-pay

## 2018-07-29 ENCOUNTER — Encounter (HOSPITAL_COMMUNITY): Payer: Self-pay

## 2018-07-31 ENCOUNTER — Encounter (HOSPITAL_COMMUNITY): Payer: Self-pay

## 2018-08-03 ENCOUNTER — Encounter (HOSPITAL_COMMUNITY): Payer: Self-pay

## 2018-08-05 ENCOUNTER — Encounter (HOSPITAL_COMMUNITY): Payer: Self-pay

## 2018-08-07 ENCOUNTER — Encounter (HOSPITAL_COMMUNITY): Payer: Self-pay

## 2018-08-10 ENCOUNTER — Encounter (HOSPITAL_COMMUNITY): Payer: Self-pay

## 2018-08-12 ENCOUNTER — Encounter (HOSPITAL_COMMUNITY): Payer: Self-pay

## 2018-08-14 ENCOUNTER — Encounter (HOSPITAL_COMMUNITY): Payer: Self-pay

## 2018-08-14 DIAGNOSIS — I2510 Atherosclerotic heart disease of native coronary artery without angina pectoris: Secondary | ICD-10-CM | POA: Insufficient documentation

## 2018-08-17 ENCOUNTER — Encounter (HOSPITAL_COMMUNITY): Payer: Self-pay

## 2018-08-19 ENCOUNTER — Encounter (HOSPITAL_COMMUNITY)
Admission: RE | Admit: 2018-08-19 | Discharge: 2018-08-19 | Disposition: A | Discharge status: Home / Self Care | Payer: Self-pay | Source: Ambulatory Visit | Attending: Cardiovascular Disease | Admitting: Cardiovascular Disease

## 2018-08-21 ENCOUNTER — Encounter (HOSPITAL_COMMUNITY): Payer: Self-pay

## 2018-08-24 ENCOUNTER — Encounter (HOSPITAL_COMMUNITY)
Admission: RE | Admit: 2018-08-24 | Discharge: 2018-08-24 | Disposition: A | Discharge status: Home / Self Care | Payer: Self-pay | Source: Ambulatory Visit | Attending: Cardiovascular Disease | Admitting: Cardiovascular Disease

## 2018-08-26 ENCOUNTER — Encounter (HOSPITAL_COMMUNITY)
Admission: RE | Admit: 2018-08-26 | Discharge: 2018-08-26 | Disposition: A | Discharge status: Home / Self Care | Payer: Self-pay | Source: Ambulatory Visit | Attending: Cardiovascular Disease | Admitting: Cardiovascular Disease

## 2018-08-28 ENCOUNTER — Encounter (HOSPITAL_COMMUNITY): Payer: Self-pay

## 2018-08-31 ENCOUNTER — Encounter (HOSPITAL_COMMUNITY): Payer: Self-pay

## 2018-09-02 ENCOUNTER — Encounter (HOSPITAL_COMMUNITY)
Admission: RE | Admit: 2018-09-02 | Discharge: 2018-09-02 | Disposition: A | Discharge status: Home / Self Care | Payer: Self-pay | Source: Ambulatory Visit | Attending: Cardiovascular Disease | Admitting: Cardiovascular Disease

## 2018-09-04 ENCOUNTER — Encounter (HOSPITAL_COMMUNITY)
Admission: RE | Admit: 2018-09-04 | Discharge: 2018-09-04 | Disposition: A | Discharge status: Home / Self Care | Payer: Self-pay | Source: Ambulatory Visit | Attending: Cardiovascular Disease | Admitting: Cardiovascular Disease

## 2018-09-07 ENCOUNTER — Encounter (HOSPITAL_COMMUNITY)
Admission: RE | Admit: 2018-09-07 | Discharge: 2018-09-07 | Disposition: A | Discharge status: Home / Self Care | Payer: Self-pay | Source: Ambulatory Visit | Attending: Cardiovascular Disease | Admitting: Cardiovascular Disease

## 2018-09-09 ENCOUNTER — Encounter (HOSPITAL_COMMUNITY)
Admission: RE | Admit: 2018-09-09 | Discharge: 2018-09-09 | Disposition: A | Discharge status: Home / Self Care | Payer: Self-pay | Source: Ambulatory Visit | Attending: Cardiovascular Disease | Admitting: Cardiovascular Disease

## 2018-09-11 ENCOUNTER — Encounter (HOSPITAL_COMMUNITY): Payer: Self-pay

## 2018-09-14 ENCOUNTER — Encounter (HOSPITAL_COMMUNITY)
Admission: RE | Admit: 2018-09-14 | Discharge: 2018-09-14 | Disposition: A | Discharge status: Home / Self Care | Payer: Medicare Other | Source: Ambulatory Visit | Attending: Cardiovascular Disease | Admitting: Cardiovascular Disease

## 2018-09-14 DIAGNOSIS — I2510 Atherosclerotic heart disease of native coronary artery without angina pectoris: Secondary | ICD-10-CM | POA: Insufficient documentation

## 2018-09-16 ENCOUNTER — Encounter (HOSPITAL_COMMUNITY): Payer: Self-pay

## 2018-09-18 ENCOUNTER — Encounter (HOSPITAL_COMMUNITY)
Admission: RE | Admit: 2018-09-18 | Discharge: 2018-09-18 | Disposition: A | Discharge status: Home / Self Care | Payer: Medicare Other | Source: Ambulatory Visit | Attending: Cardiovascular Disease | Admitting: Cardiovascular Disease

## 2018-09-21 ENCOUNTER — Encounter (HOSPITAL_COMMUNITY)
Admission: RE | Admit: 2018-09-21 | Discharge: 2018-09-21 | Disposition: A | Discharge status: Home / Self Care | Payer: Medicare Other | Source: Ambulatory Visit | Attending: Cardiovascular Disease | Admitting: Cardiovascular Disease

## 2018-09-23 ENCOUNTER — Encounter (HOSPITAL_COMMUNITY): Payer: Self-pay

## 2018-09-25 ENCOUNTER — Encounter (HOSPITAL_COMMUNITY): Payer: Self-pay

## 2018-09-28 ENCOUNTER — Encounter (HOSPITAL_COMMUNITY): Payer: Self-pay

## 2018-09-30 ENCOUNTER — Encounter (HOSPITAL_COMMUNITY): Payer: Self-pay

## 2018-10-02 ENCOUNTER — Encounter (HOSPITAL_COMMUNITY): Payer: Self-pay

## 2018-10-05 ENCOUNTER — Encounter (HOSPITAL_COMMUNITY): Payer: Self-pay

## 2018-10-09 ENCOUNTER — Encounter (HOSPITAL_COMMUNITY)
Admission: RE | Admit: 2018-10-09 | Discharge: 2018-10-09 | Disposition: A | Discharge status: Home / Self Care | Payer: Medicare Other | Source: Ambulatory Visit | Attending: Cardiovascular Disease | Admitting: Cardiovascular Disease

## 2018-10-12 ENCOUNTER — Encounter (HOSPITAL_COMMUNITY)
Admission: RE | Admit: 2018-10-12 | Discharge: 2018-10-12 | Disposition: A | Discharge status: Home / Self Care | Payer: Medicare Other | Source: Ambulatory Visit | Attending: Cardiovascular Disease | Admitting: Cardiovascular Disease

## 2018-10-14 DIAGNOSIS — R918 Other nonspecific abnormal finding of lung field: Secondary | ICD-10-CM

## 2018-10-14 HISTORY — DX: Other nonspecific abnormal finding of lung field: R91.8

## 2018-10-16 ENCOUNTER — Encounter (HOSPITAL_COMMUNITY): Payer: Self-pay

## 2018-10-16 DIAGNOSIS — I2510 Atherosclerotic heart disease of native coronary artery without angina pectoris: Secondary | ICD-10-CM | POA: Insufficient documentation

## 2018-10-19 ENCOUNTER — Encounter (HOSPITAL_COMMUNITY): Payer: Self-pay

## 2018-10-21 ENCOUNTER — Encounter (HOSPITAL_COMMUNITY): Payer: Self-pay

## 2018-10-23 ENCOUNTER — Encounter (HOSPITAL_COMMUNITY): Payer: Self-pay

## 2018-10-26 ENCOUNTER — Encounter (HOSPITAL_COMMUNITY)
Admission: RE | Admit: 2018-10-26 | Discharge: 2018-10-26 | Disposition: A | Discharge status: Home / Self Care | Payer: Medicare Other | Source: Ambulatory Visit | Attending: Cardiovascular Disease | Admitting: Cardiovascular Disease

## 2018-10-28 ENCOUNTER — Encounter (HOSPITAL_COMMUNITY): Payer: Self-pay

## 2018-10-30 ENCOUNTER — Encounter (HOSPITAL_COMMUNITY): Payer: Self-pay

## 2018-11-02 ENCOUNTER — Encounter (HOSPITAL_COMMUNITY)
Admission: RE | Admit: 2018-11-02 | Discharge: 2018-11-02 | Disposition: A | Discharge status: Home / Self Care | Payer: Medicare Other | Source: Ambulatory Visit | Attending: Cardiovascular Disease | Admitting: Cardiovascular Disease

## 2018-11-04 ENCOUNTER — Encounter (HOSPITAL_COMMUNITY)
Admission: RE | Admit: 2018-11-04 | Discharge: 2018-11-04 | Disposition: A | Discharge status: Home / Self Care | Payer: Medicare Other | Source: Ambulatory Visit | Attending: Cardiovascular Disease | Admitting: Cardiovascular Disease

## 2018-11-05 ENCOUNTER — Other Ambulatory Visit: Payer: Self-pay | Admitting: Otolaryngology

## 2018-11-05 DIAGNOSIS — H903 Sensorineural hearing loss, bilateral: Secondary | ICD-10-CM

## 2018-11-05 DIAGNOSIS — H9311 Tinnitus, right ear: Secondary | ICD-10-CM

## 2018-11-06 ENCOUNTER — Encounter (HOSPITAL_COMMUNITY)
Admission: RE | Admit: 2018-11-06 | Discharge: 2018-11-06 | Disposition: A | Discharge status: Home / Self Care | Payer: Medicare Other | Source: Ambulatory Visit | Attending: Cardiovascular Disease | Admitting: Cardiovascular Disease

## 2018-11-08 ENCOUNTER — Ambulatory Visit
Admission: RE | Admit: 2018-11-08 | Discharge: 2018-11-08 | Disposition: A | Discharge status: Home / Self Care | Payer: Medicare Other | Source: Ambulatory Visit | Attending: Otolaryngology | Admitting: Otolaryngology

## 2018-11-08 DIAGNOSIS — H903 Sensorineural hearing loss, bilateral: Secondary | ICD-10-CM

## 2018-11-08 DIAGNOSIS — H9311 Tinnitus, right ear: Secondary | ICD-10-CM

## 2018-11-08 MED ORDER — GADOBENATE DIMEGLUMINE 529 MG/ML IV SOLN
15.0000 mL | Freq: Once | INTRAVENOUS | Status: AC | PRN
  Administered 2018-11-08: 15 mL via INTRAVENOUS

## 2018-11-09 ENCOUNTER — Encounter (HOSPITAL_COMMUNITY): Payer: Self-pay

## 2018-11-11 ENCOUNTER — Encounter (HOSPITAL_COMMUNITY)
Admission: RE | Admit: 2018-11-11 | Discharge: 2018-11-11 | Disposition: A | Discharge status: Home / Self Care | Payer: Medicare Other | Source: Ambulatory Visit | Attending: Cardiovascular Disease | Admitting: Cardiovascular Disease

## 2018-11-13 ENCOUNTER — Encounter (HOSPITAL_COMMUNITY): Payer: Self-pay

## 2018-11-16 ENCOUNTER — Encounter (HOSPITAL_COMMUNITY): Payer: Self-pay

## 2018-11-16 DIAGNOSIS — I2510 Atherosclerotic heart disease of native coronary artery without angina pectoris: Secondary | ICD-10-CM | POA: Insufficient documentation

## 2018-11-18 ENCOUNTER — Encounter (HOSPITAL_COMMUNITY)
Admission: RE | Admit: 2018-11-18 | Discharge: 2018-11-18 | Disposition: A | Discharge status: Home / Self Care | Payer: Medicare Other | Source: Ambulatory Visit | Attending: Cardiovascular Disease | Admitting: Cardiovascular Disease

## 2018-11-18 ENCOUNTER — Other Ambulatory Visit: Payer: Self-pay | Admitting: Cardiovascular Disease

## 2018-11-20 ENCOUNTER — Encounter (HOSPITAL_COMMUNITY): Payer: Self-pay

## 2018-11-23 ENCOUNTER — Encounter (HOSPITAL_COMMUNITY): Payer: Self-pay

## 2018-11-25 ENCOUNTER — Encounter (HOSPITAL_COMMUNITY): Payer: Self-pay

## 2018-11-26 ENCOUNTER — Encounter: Payer: Self-pay | Admitting: Cardiovascular Disease

## 2018-11-27 ENCOUNTER — Encounter (HOSPITAL_COMMUNITY): Payer: Self-pay

## 2018-11-30 ENCOUNTER — Encounter (HOSPITAL_COMMUNITY): Payer: Self-pay

## 2018-12-02 ENCOUNTER — Encounter (HOSPITAL_COMMUNITY)
Admission: RE | Admit: 2018-12-02 | Discharge: 2018-12-02 | Disposition: A | Discharge status: Home / Self Care | Payer: Medicare Other | Source: Ambulatory Visit | Attending: Cardiovascular Disease | Admitting: Cardiovascular Disease

## 2018-12-04 ENCOUNTER — Encounter (HOSPITAL_COMMUNITY): Payer: Self-pay

## 2018-12-07 ENCOUNTER — Encounter (HOSPITAL_COMMUNITY): Payer: Self-pay

## 2018-12-09 ENCOUNTER — Encounter (HOSPITAL_COMMUNITY): Payer: Self-pay

## 2018-12-11 ENCOUNTER — Encounter (HOSPITAL_COMMUNITY): Payer: Self-pay

## 2018-12-14 ENCOUNTER — Encounter (HOSPITAL_COMMUNITY): Payer: Self-pay | Attending: Cardiovascular Disease

## 2018-12-14 ENCOUNTER — Encounter: Payer: Self-pay | Admitting: Cardiovascular Disease

## 2018-12-14 ENCOUNTER — Ambulatory Visit (INDEPENDENT_AMBULATORY_CARE_PROVIDER_SITE_OTHER): Payer: Medicare Other | Admitting: Cardiovascular Disease

## 2018-12-14 VITALS — BP 122/80 | HR 62 | Ht 71.0 in | Wt 180.2 lb

## 2018-12-14 DIAGNOSIS — E7800 Pure hypercholesterolemia, unspecified: Secondary | ICD-10-CM | POA: Diagnosis not present

## 2018-12-14 DIAGNOSIS — I2510 Atherosclerotic heart disease of native coronary artery without angina pectoris: Secondary | ICD-10-CM | POA: Insufficient documentation

## 2018-12-14 DIAGNOSIS — I480 Paroxysmal atrial fibrillation: Secondary | ICD-10-CM | POA: Diagnosis not present

## 2018-12-14 MED ORDER — NITROGLYCERIN 0.4 MG SL SUBL: SUBLINGUAL_TABLET | SUBLINGUAL | 2 refills | Status: DC

## 2018-12-14 MED ORDER — METOPROLOL TARTRATE 25 MG PO TABS: 25.0000 mg | ORAL_TABLET | Freq: Two times a day (BID) | ORAL | 3 refills | Status: DC

## 2018-12-14 NOTE — Patient Instructions (Signed)

## 2018-12-14 NOTE — Progress Notes (Signed)
Chief Complaint  Patient presents with  . Follow-up    CAD   History of Present Illness: 68 yo male with history of CAD, atrial fibrillation, PVCs, HLD and GERD who is here today for cardiac follow up. He had a cardiac cath in May 2017 in the setting of unstable angina which showed a severe stenosis in the first obtuse marginal branch. A drug eluting stent was placed in the first OM. There was mild disease in the RCA and LAD. LV function was normal.  Frequent PVCs noted during his hospital stay. Cardiac monitor June 2017 with frequent PVCS and periods of sinus bradycardia. He was seen in the EP clinic by Dr. Graciela Husbands and he recommended the patient continue his beta blocker. He called our office complaining of palpitations at home mid December 2018 with HR up to the 150s. Cardiac monitor December 2018 showed sinus bradycardia and episodes of atrial fibrillation/flutter. He is now on a beta blocker and Xarelto.   He is here today for follow up. The patient denies any chest pain, dyspnea, palpitations, lower extremity edema, orthopnea, PND, dizziness, near syncope or syncope. He has been going to cardiac rehab and having no issues. He has been having some bleeding form his hemorrhoids.   Primary Care Physician: Nila Nephew, MD   Past Medical History:  Diagnosis Date  . CAD (coronary artery disease)    a. DES to OM1 02/2016.   . Cataract    bil cateracts  . Diverticulosis   . Esophageal stricture   . GERD (gastroesophageal reflux disease)   . Hemorrhoids   . Hiatal hernia   . Hyperlipidemia   . IBS (irritable bowel syndrome)   . Multiple food allergies    "lots of food allergies; beef, tuna, flounder, potatoes, garlic, tomatoes, apples, bananas, sesame seeds, shellfish  . Pre-diabetes   . PVC's (premature ventricular contractions)     Past Surgical History:  Procedure Laterality Date  . CARDIAC CATHETERIZATION N/A 02/28/2016   Procedure: Left Heart Cath and Coronary Angiography;   Surgeon: Iran Ouch, MD;  [facility];  Service: Cardiovascular;  Laterality: N/A;  . CARDIAC CATHETERIZATION N/A 02/28/2016   Procedure: Coronary Stent Intervention;  Surgeon: Iran Ouch, MD;  [facility]: [facility];  Service: Cardiovascular;  Laterality: N/A;  . COLONOSCOPY    . CYSTOSCOPY WITH STENT PLACEMENT    . ESOPHAGOGASTRODUODENOSCOPY (EGD) WITH ESOPHAGEAL DILATION    . HEMORRHOID BANDING    . HERNIA REPAIR    . SURGERY SCROTAL / TESTICULAR Left ~ 1960   "hernia"  . TONSILLECTOMY    . UPPER GASTROINTESTINAL ENDOSCOPY      Current Outpatient Medications  Medication Sig Dispense Refill  . aspirin EC 81 MG tablet Take 1 tablet (81 mg total) by mouth daily. 90 tablet 3  . atorvastatin (LIPITOR) 40 MG tablet Take 1 tablet (40 mg total) by mouth daily. 30 tablet 5  . CIALIS 5 MG tablet Take 5 mg by mouth daily as needed for erectile dysfunction.   98  . halobetasol (ULTRAVATE) 0.05 % cream Apply 1 application topically daily.     . Magnesium 250 MG TABS Take 250 mg by mouth daily.    . metoprolol tartrate (LOPRESSOR) 25 MG tablet Take 1 tablet (25 mg total) by mouth 2 (two) times daily. 180 tablet 3  . nitroGLYCERIN (NITROSTAT) 0.4 MG SL tablet PLACE 1 TABLET UNDER THE TONGUE EVERY 5 MINUTES AS NEEDED FOR CHEST PAIN. MAX  3 DOSES 75 tablet 2  . pantoprazole (PROTONIX) 40 MG tablet TAKE 1 TABLET(40 MG) BY MOUTH DAILY 90 tablet 3  . Probiotic Product (PROBIOTIC DAILY PO) Take 1 capsule by mouth daily.     Marland Kitchen triamcinolone (NASACORT ALLERGY 24HR) 55 MCG/ACT AERO nasal inhaler Place 2 sprays into the nose daily as needed (allergies).     Carlena Hurl 20 MG TABS tablet TAKE 1 TABLET BY MOUTH EVERY DAY WITH SUPPER 90 tablet 2   No current facility-administered medications for this visit.     Allergies  Allergen Reactions  . Iodinated Diagnostic Agents Swelling  . Sulfa Antibiotics Swelling    Mouth swelling  . Aleve [Naproxen Sodium]     Doesn't remember   . Apple Swelling  . Banana Nausea And Vomiting  . Beef-Derived Products Nausea And Vomiting  . Erythromycin Swelling    Mouth swells  . Fish Allergy Swelling  . Garlic Swelling  . Sesame Seed (Diagnostic) Swelling  . Shellfish Allergy Other (See Comments)    Not sure of reaction found out about allergy through [place]  . Sulfonamide Derivatives Swelling    Mouth swelling  . Tomato Nausea And Vomiting  . Iodine Swelling    "I blow up like a balloon"  . Strawberry (Diagnostic) Rash    Social History   Socioeconomic History  . Marital status: Married    Spouse name: Not on file  . Number of children: Not on file  . Years of education: Not on file  . Highest education level: Not on file  Occupational History  . Not on file  Social Needs  . Financial resource strain: Not on file  . Food insecurity:    Worry: Not on file    Inability: Not on file  . Transportation needs:    Medical: Not on file    Non-medical: Not on file  Tobacco Use  . Smoking status: Former Smoker    Packs/day: 1.00    Years: 20.00    Pack years: 20.00    Types: Cigarettes  . Smokeless tobacco: Never Used  . Tobacco comment: "quit smoking cigarettes in the 1980s"  Substance and Sexual Activity  . Alcohol use: Yes    Alcohol/week: 0.0 standard drinks    Comment: 02/27/2016 "nothing in the last few weeks; no regular pattern"  . Drug use: No  . Sexual activity: Yes  Lifestyle  . Physical activity:    Days per week: Not on file    Minutes per session: Not on file  . Stress: Not on file  Relationships  . Social connections:    Talks on phone: Not on file    Gets together: Not on file    Attends religious service: Not on file    Active member of club or organization: Not on file    Attends meetings of clubs or organizations: Not on file    Relationship status: Not on file  . Intimate partner violence:    Fear of current or ex partner: Not on file    Emotionally abused: Not on file     Physically abused: Not on file    Forced sexual activity: Not on file  Other Topics Concern  . Not on file  Social History Narrative  . Not on file    Family History  Problem Relation Age of Onset  . Heart disease Mother   . Heart disease Father   . Colon cancer Neg Hx   . Esophageal cancer Neg Hx   .  Rectal cancer Neg Hx   . Stomach cancer Neg Hx     Review of Systems:  As stated in the HPI and otherwise negative.   BP 122/80   Pulse 62   Ht 5\' 11"  (1.803 m)   Wt 81.7 kg   SpO2 98%   BMI 25.13 kg/m   Physical Examination: General: Well developed, well nourished, NAD  HEENT: OP clear, mucus membranes moist  SKIN: warm, dry. No rashes. Neuro: No focal deficits  Musculoskeletal: Muscle strength 5/5 all ext  Psychiatric: Mood and affect normal  Neck: No JVD, no carotid bruits, no thyromegaly, no lymphadenopathy.  Lungs:Clear bilaterally, no wheezes, rhonci, crackles Cardiovascular: Regular rate and rhythm. No murmurs, gallops or rubs. Abdomen:Soft. Bowel sounds present. Non-tender.  Extremities: No lower extremity edema. Pulses are 2 + in the bilateral DP/PT.  Cardiac cath 02/28/16:  Prox RCA lesion, 30% stenosed.  Mid RCA to Dist RCA lesion, 40% stenosed.  Prox LAD to Mid LAD lesion, 30% stenosed.  1st Mrg lesion, 99% stenosed. Post intervention, there is a 0% residual stenosis.  The left ventricular systolic function is normal.   1. Severe (99% ) one-vessel coronary artery disease involving proximal OM1 which is very large and supplies most of the left circumflex distribution. 2. Normal LV systolic function and mildly elevated left ventricular end-diastolic pressure. 3. Successful angioplasty and drug-eluting stent placement to OM 1.  Echo 01/15/17: - Left ventricle: The cavity size was normal. Wall thickness was   normal. Systolic function was normal. The estimated ejection   fraction was in the range of 60% to 65%. Wall motion was normal;   there were no  regional wall motion abnormalities. Doppler   parameters are consistent with abnormal left ventricular   relaxation (grade 1 diastolic dysfunction). The E/e&' ratio is   between 8-15, suggesting indeterminate LV filling pressure. - Mitral valve: Mildly thickened leaflets . There was trivial   regurgitation. - Left atrium: The atrium was normal in size. - Atrial septum: No defect or patent foramen ovale was identified. - Tricuspid valve: There was mild regurgitation. - Pulmonary arteries: PA peak pressure: 38 mm Hg (S). - Inferior vena cava: The vessel was normal in size. The   respirophasic diameter changes were in the normal range (>= 50%),   consistent with normal central venous pressure.  Impressions:  - LVEF 60-65%, normal wall thickness, normal wall motion, grade 1   DD with indeterminate LV filling pressure, normal LA size, upper   normal RA size, trivial MR, mild TR, RVSP 38 mmHg, normal IVC.  EKG:  EKG is ordered today. The ekg ordered today demonstrates NSR, rate 62 bpm.   Recent Labs: No results found for requested labs within last 8760 hours.   Lipid Panel    Component Value Date/Time   CHOL 124 (L) 04/24/2016 0847   TRIG 113 04/24/2016 0847   HDL 36 (L) 04/24/2016 0847   CHOLHDL 3.4 04/24/2016 0847   VLDL 23 04/24/2016 0847   LDLCALC 65 04/24/2016 0847     Wt Readings from Last 3 Encounters:  12/14/18 81.7 kg  12/15/17 78.8 kg  10/23/17 79.2 kg     Other studies Reviewed: Additional studies/ records that were reviewed today include: . Review of the above records demonstrates:   Assessment and Plan:   1. CAD without angina: He had a drug eluting stent placed in the obtuse marginal branch of the Circumflex in May 2017. Normal LV function by echo April 2018.He has no  chest pain. Will continue ASA, statin and beta blocker.    2. HLD: Lipids followed in primary care. LDL at goal. Will continue statin  3. Atrial fibrillation, paroxysmal: Sinus today. CHADS  VASC score of 1. Continue beta blocker and Xarelto. He is having hemorrhoidal bleeding. If he needs to hold Xarelto short term I think this would be ok.   Current medicines are reviewed at length with the patient today.  The patient does not have concerns regarding medicines.  The following changes have been made:  no change  Labs/ tests ordered today include:   Orders Placed This Encounter  Procedures  . EKG 12-Lead    Disposition:   FU with me in 12 months  Signed, Verne Carrow, MD 12/14/2018 9:11 AM    [facility] [address] [place], [place], [place]  [address] Phone: [phone]; Fax: [phone]

## 2018-12-16 ENCOUNTER — Encounter (HOSPITAL_COMMUNITY): Payer: Self-pay

## 2018-12-18 ENCOUNTER — Encounter (HOSPITAL_COMMUNITY): Payer: Self-pay

## 2018-12-21 ENCOUNTER — Encounter (HOSPITAL_COMMUNITY): Payer: Self-pay

## 2018-12-23 ENCOUNTER — Encounter (HOSPITAL_COMMUNITY): Payer: Self-pay

## 2018-12-25 ENCOUNTER — Encounter (HOSPITAL_COMMUNITY): Payer: Self-pay

## 2018-12-28 ENCOUNTER — Encounter (HOSPITAL_COMMUNITY): Payer: Self-pay

## 2018-12-28 ENCOUNTER — Telehealth (HOSPITAL_COMMUNITY): Payer: Self-pay | Admitting: Cardiac Rehabilitation

## 2018-12-28 NOTE — Telephone Encounter (Signed)
Phone call to inform pt of Outpatient Cardiac Rehab department closure for 2 weeks.  Pt verbalized understanding.  Joann Rion, RN, BSN Cardiac Pulmonary Rehab  

## 2018-12-30 ENCOUNTER — Encounter (HOSPITAL_COMMUNITY): Payer: Self-pay

## 2019-01-01 ENCOUNTER — Encounter (HOSPITAL_COMMUNITY): Payer: Self-pay

## 2019-01-04 ENCOUNTER — Encounter (HOSPITAL_COMMUNITY): Payer: Self-pay

## 2019-01-05 ENCOUNTER — Other Ambulatory Visit: Payer: Self-pay | Admitting: Cardiovascular Disease

## 2019-01-05 ENCOUNTER — Telehealth (HOSPITAL_COMMUNITY): Payer: Self-pay | Admitting: *Deleted

## 2019-01-05 NOTE — Telephone Encounter (Signed)
68 yo, 180 lbs, Scr 0.86 from 10/23/17 (note added to recall to get labs) Crcl 26ml/min Last OV 10/23/17 recall entered for 12/2018 will refill given COVID

## 2019-01-06 ENCOUNTER — Encounter (HOSPITAL_COMMUNITY): Payer: Self-pay

## 2019-01-08 ENCOUNTER — Encounter (HOSPITAL_COMMUNITY): Payer: Self-pay

## 2019-01-11 ENCOUNTER — Encounter (HOSPITAL_COMMUNITY): Payer: Self-pay

## 2019-01-13 ENCOUNTER — Encounter (HOSPITAL_COMMUNITY): Payer: Self-pay

## 2019-01-15 ENCOUNTER — Encounter (HOSPITAL_COMMUNITY): Payer: Self-pay

## 2019-01-18 ENCOUNTER — Encounter (HOSPITAL_COMMUNITY): Payer: Self-pay

## 2019-01-20 ENCOUNTER — Encounter (HOSPITAL_COMMUNITY): Payer: Self-pay

## 2019-01-22 ENCOUNTER — Encounter (HOSPITAL_COMMUNITY): Payer: Self-pay

## 2019-01-25 ENCOUNTER — Encounter (HOSPITAL_COMMUNITY): Payer: Self-pay

## 2019-01-27 ENCOUNTER — Encounter (HOSPITAL_COMMUNITY): Payer: Self-pay

## 2019-01-29 ENCOUNTER — Encounter (HOSPITAL_COMMUNITY): Payer: Self-pay

## 2019-02-01 ENCOUNTER — Encounter (HOSPITAL_COMMUNITY): Payer: Self-pay

## 2019-02-03 ENCOUNTER — Encounter (HOSPITAL_COMMUNITY): Payer: Self-pay

## 2019-02-04 ENCOUNTER — Other Ambulatory Visit: Payer: Self-pay

## 2019-02-04 ENCOUNTER — Ambulatory Visit
Admission: RE | Admit: 2019-02-04 | Discharge: 2019-02-04 | Disposition: A | Discharge status: Home / Self Care | Payer: Medicare Other | Source: Ambulatory Visit | Attending: Internal Medicine | Admitting: Internal Medicine

## 2019-02-04 ENCOUNTER — Other Ambulatory Visit: Payer: Self-pay | Admitting: Internal Medicine

## 2019-02-04 DIAGNOSIS — R042 Hemoptysis: Secondary | ICD-10-CM

## 2019-02-05 ENCOUNTER — Encounter (HOSPITAL_COMMUNITY): Payer: Self-pay

## 2019-02-05 ENCOUNTER — Other Ambulatory Visit: Payer: Self-pay | Admitting: Internal Medicine

## 2019-02-05 DIAGNOSIS — R222 Localized swelling, mass and lump, trunk: Secondary | ICD-10-CM

## 2019-02-08 ENCOUNTER — Encounter (HOSPITAL_COMMUNITY): Payer: Self-pay

## 2019-02-09 ENCOUNTER — Other Ambulatory Visit: Payer: Self-pay

## 2019-02-09 ENCOUNTER — Ambulatory Visit
Admission: RE | Admit: 2019-02-09 | Discharge: 2019-02-09 | Disposition: A | Discharge status: Home / Self Care | Payer: Medicare Other | Source: Ambulatory Visit | Attending: Internal Medicine | Admitting: Internal Medicine

## 2019-02-09 DIAGNOSIS — R222 Localized swelling, mass and lump, trunk: Secondary | ICD-10-CM

## 2019-02-10 ENCOUNTER — Encounter (HOSPITAL_COMMUNITY): Payer: Self-pay

## 2019-04-13 ENCOUNTER — Telehealth (HOSPITAL_COMMUNITY): Payer: Self-pay

## 2019-07-09 ENCOUNTER — Other Ambulatory Visit: Payer: Self-pay | Admitting: Internal Medicine

## 2019-07-09 DIAGNOSIS — J984 Other disorders of lung: Secondary | ICD-10-CM

## 2019-08-11 ENCOUNTER — Other Ambulatory Visit: Payer: Self-pay

## 2019-08-11 ENCOUNTER — Ambulatory Visit
Admission: RE | Admit: 2019-08-11 | Discharge: 2019-08-11 | Disposition: A | Discharge status: Home / Self Care | Payer: Medicare Other | Source: Ambulatory Visit | Attending: Internal Medicine | Admitting: Internal Medicine

## 2019-08-11 DIAGNOSIS — J984 Other disorders of lung: Secondary | ICD-10-CM

## 2019-08-27 ENCOUNTER — Other Ambulatory Visit: Payer: Self-pay

## 2019-08-27 DIAGNOSIS — Z20822 Contact with and (suspected) exposure to covid-19: Secondary | ICD-10-CM

## 2019-08-30 LAB — NOVEL CORONAVIRUS, NAA: SARS-CoV-2, NAA: NOT DETECTED

## 2019-09-17 ENCOUNTER — Other Ambulatory Visit: Payer: Self-pay

## 2019-09-17 DIAGNOSIS — Z20822 Contact with and (suspected) exposure to covid-19: Secondary | ICD-10-CM

## 2019-09-19 LAB — NOVEL CORONAVIRUS, NAA: SARS-CoV-2, NAA: NOT DETECTED

## 2019-09-28 ENCOUNTER — Other Ambulatory Visit: Payer: Self-pay

## 2019-09-28 ENCOUNTER — Other Ambulatory Visit: Payer: Medicare Other

## 2019-09-28 ENCOUNTER — Other Ambulatory Visit: Payer: Self-pay | Admitting: Cardiovascular Disease

## 2019-09-28 DIAGNOSIS — I480 Paroxysmal atrial fibrillation: Secondary | ICD-10-CM

## 2019-09-28 LAB — CBC
Hematocrit: 45.5 % (ref 37.5–51.0)
Hemoglobin: 14.6 g/dL (ref 13.0–17.7)
MCH: 26.5 pg — ABNORMAL LOW (ref 26.6–33.0)
MCHC: 32.1 g/dL (ref 31.5–35.7)
MCV: 83 fL (ref 79–97)
Platelets: 278 10*3/uL (ref 150–450)
RBC: 5.51 x10E6/uL (ref 4.14–5.80)
RDW: 15.2 % (ref 11.6–15.4)
WBC: 9.6 10*3/uL (ref 3.4–10.8)

## 2019-09-28 LAB — BASIC METABOLIC PANEL
BUN/Creatinine Ratio: 18 (ref 10–24)
BUN: 14 mg/dL (ref 8–27)
CO2: 25 mmol/L (ref 20–29)
Calcium: 10.2 mg/dL (ref 8.6–10.2)
Chloride: 100 mmol/L (ref 96–106)
Creatinine, Ser: 0.8 mg/dL (ref 0.76–1.27)
GFR calc Af Amer: 105 mL/min/{1.73_m2} (ref >59)
GFR calc non Af Amer: 91 mL/min/{1.73_m2} (ref >59)
Glucose: 95 mg/dL (ref 65–99)
Potassium: 5.1 mmol/L (ref 3.5–5.2)
Sodium: 138 mmol/L (ref 134–144)

## 2019-09-28 NOTE — Telephone Encounter (Signed)
Prescription refill request for Xarelto received.   Last office visit: 12/14/2018, Mcalhany Weight: 81.7 kg Age: 68 y.o. Scr:  0.86, 10/23/2017 CrCl: 94 ml/min  Pt is overdue for blood work. Called and spoke to pt. Pt is able to come in today and get blood work.

## 2019-09-29 NOTE — Telephone Encounter (Signed)
Pt has labwork on 09/28/19, Creat 0.8, age 67, weight 81.7kg, CrCl 100.71, based on CrCl pt is on appropriate dosage of Xarelto 20mg  QD.  Will refill rx.

## 2019-11-10 ENCOUNTER — Ambulatory Visit: Payer: Medicare Other

## 2019-11-11 ENCOUNTER — Ambulatory Visit: Payer: Medicare Other | Attending: Internal Medicine

## 2019-11-11 ENCOUNTER — Telehealth: Payer: Self-pay | Admitting: Cardiovascular Disease

## 2019-11-11 DIAGNOSIS — Z20822 Contact with and (suspected) exposure to covid-19: Secondary | ICD-10-CM

## 2019-11-11 NOTE — Telephone Encounter (Signed)
Informed patient that he can wait until his next appointment and if further labs are needed he will come fasting that morning. His PCP usually manages but is retiring and they will be changing practices so he might decide he wants cardiology to manage lipids.

## 2019-11-11 NOTE — Telephone Encounter (Signed)
Patient wanted to know if the labs he had done on 12-15 would be recent enough for his upcoming appointment in April.   He also wanted to know how he would get re-registered for his COVID Vaccine. He and his Wife were some of the patients whose vaccines were cancelled because of the shortage. If the office would prefer, a response can be sent to him via MyChart

## 2019-11-12 LAB — NOVEL CORONAVIRUS, NAA: SARS-CoV-2, NAA: NOT DETECTED

## 2019-11-19 ENCOUNTER — Ambulatory Visit: Payer: Medicare Other

## 2019-11-21 ENCOUNTER — Ambulatory Visit: Payer: Medicare Other

## 2019-12-06 ENCOUNTER — Ambulatory Visit
Admission: RE | Admit: 2019-12-06 | Discharge: 2019-12-06 | Disposition: A | Discharge status: Home / Self Care | Payer: Medicare Other | Source: Ambulatory Visit | Attending: Internal Medicine | Admitting: Internal Medicine

## 2019-12-06 ENCOUNTER — Other Ambulatory Visit: Payer: Self-pay

## 2019-12-06 ENCOUNTER — Other Ambulatory Visit: Payer: Self-pay | Admitting: Internal Medicine

## 2019-12-06 DIAGNOSIS — R042 Hemoptysis: Secondary | ICD-10-CM

## 2019-12-29 ENCOUNTER — Encounter: Payer: Self-pay | Admitting: *Deleted

## 2019-12-30 ENCOUNTER — Other Ambulatory Visit: Payer: Self-pay

## 2019-12-30 ENCOUNTER — Encounter: Payer: Self-pay | Admitting: Emergency Medicine

## 2019-12-30 ENCOUNTER — Ambulatory Visit (INDEPENDENT_AMBULATORY_CARE_PROVIDER_SITE_OTHER): Payer: Medicare Other | Admitting: Emergency Medicine

## 2019-12-30 VITALS — BP 128/66 | HR 64 | Temp 97.1°F | Ht 70.0 in | Wt 179.2 lb

## 2019-12-30 DIAGNOSIS — R042 Hemoptysis: Secondary | ICD-10-CM

## 2019-12-30 DIAGNOSIS — R918 Other nonspecific abnormal finding of lung field: Secondary | ICD-10-CM

## 2019-12-30 NOTE — Assessment & Plan Note (Signed)
3 discrete episodes of unclear cause, not necessarily connected with bronchitic symptoms, pneumonia.  He does not cough frequently.  May be related to the pulmonary nodular disease but unclear, could be a separate issue.  He has occasional epistaxis and also has a history of significant sinusitis (not currently active) which could be contributors.  I believe he needs inspection bronchoscopy, BAL to initiate work-up.  I will also send QuantiFERON gold although my pretest suspicion for TB is low.

## 2019-12-30 NOTE — Patient Instructions (Signed)
We will arrange for bronchoscopy in the near future to evaluate for possible sources of bleeding We will repeat your CT scan of the chest in August 2021 to follow your pulmonary nodules Blood work today Follow with Dr Delton Coombes in 1 month

## 2019-12-30 NOTE — Assessment & Plan Note (Signed)
Migratory pulmonary nodules with a tree-in-bud component, suspicious for mycobacterial colonization.  The resolution of some of the nodules is reassuring, argues against malignancy.  They will need to be followed with serial imaging, next in August 2021.  There may be a unifying diagnosis here, mycobacterial disease may explain episodic cough and hemoptysis as well as pulmonary nodules.  I will arrange for bronchoscopy as above in addition to CTs to follow the nodular disease.

## 2019-12-30 NOTE — Progress Notes (Signed)
Subjective:    Patient ID: Travis Jacobs, male    DOB: 1951-02-06, 69 y.o.   MRN: [id number]  HPI 69 year old former smoker (20 pack years) with a history of CAD/PTCI, GERD with hiatal hernia and esophageal strictures (dilated Dr. Henrene Pastor), IBS, paroxysmal atrial fibrillation on Xarelto.  He is referred today for evaluation hemoptysis and pulmonary nodular disease on CT scan of the chest.  He underwent CT chest 02/09/2019, again 08/11/2019 and then most recently 12/06/2019.  I have reviewed all of these.  The original scan identified some apical scarring 9 mm right upper lobe density associated with linear scar.  This area was actually less prominent on scan 08/11/2019, but a new 8 mm nodule was noted in the superior segment of the right lower lobe.  Finally the scan from 12/06/2019 shows stable right upper lobe nodule associated with scar, resolution of 1 right lower lobe nodule but appearance of 6.5 mm hazy groundglass opacity, new 8 mm right lower lobe nodule with some surrounding tree-in-bud changes.  First saw scant blood w mucous in 01/2019, wasn't associated with any other symptoms. He has had two other episodes, both without other sx.  He describes some epistaxis occasionally, but didn't necessarily correlate with the blood. Otherwise minimal cough. Denies fevers or constitutional sx. Has had sinusitis remotely but not recently and not associated with these episodes.    Review of Systems  Constitutional: Negative for fever and unexpected weight change.  HENT: Positive for congestion. Negative for dental problem, ear pain, nosebleeds, postnasal drip, rhinorrhea, sinus pressure, sneezing, sore throat and trouble swallowing.   Eyes: Positive for itching. Negative for redness.  Respiratory: Positive for cough and shortness of breath. Negative for chest tightness and wheezing.   Cardiovascular: Positive for palpitations. Negative for leg swelling.  Gastrointestinal: Negative for nausea and  vomiting.  Genitourinary: Negative for dysuria.  Musculoskeletal: Negative for joint swelling.  Skin: Negative for rash.  Allergic/Immunologic: Positive for environmental allergies and food allergies. Negative for immunocompromised state.  Neurological: Negative for headaches.  Hematological: Does not bruise/bleed easily.  Psychiatric/Behavioral: Negative for dysphoric mood. The patient is not nervous/anxious.     Past Medical History:  Diagnosis Date  . CAD (coronary artery disease)    a. DES to OM1 02/2016.   . Cataract    bil cateracts  . Diverticulosis   . Esophageal stricture   . GERD (gastroesophageal reflux disease)   . Hemorrhoids   . Hiatal hernia   . Hyperlipidemia   . IBS (irritable bowel syndrome)   . Multiple food allergies    "lots of food allergies; beef, tuna, flounder, potatoes, garlic, tomatoes, apples, bananas, sesame seeds, shellfish  . Pre-diabetes   . PVC's (premature ventricular contractions)      Family History  Problem Relation Age of Onset  . Heart disease Mother   . Heart disease Father   . Colon cancer Neg Hx   . Esophageal cancer Neg Hx   . Rectal cancer Neg Hx   . Stomach cancer Neg Hx      Social History   Socioeconomic History  . Marital status: Married    Spouse name: Not on file  . Number of children: Not on file  . Years of education: Not on file  . Highest education level: Not on file  Occupational History  . Not on file  Tobacco Use  . Smoking status: Former Smoker    Packs/day: 1.00    Years: 20.00    Pack years:  20.00    Types: Cigarettes    Quit date: 10/14/1988    Years since quitting: 31.2  . Smokeless tobacco: Never Used  . Tobacco comment: "quit smoking cigarettes in the 1980s"  Substance and Sexual Activity  . Alcohol use: Yes    Alcohol/week: 0.0 standard drinks    Comment: 02/27/2016 "nothing in the last few weeks; no regular pattern"  . Drug use: No  . Sexual activity: Yes  Other Topics Concern  . Not on  file  Social History Narrative  . Not on file   Social Determinants of Health   Financial Resource Strain:   . Difficulty of Paying Living Expenses:   Food Insecurity:   . Worried About Charity fundraiser in the Last Year:   . Arboriculturist in the Last Year:   Transportation Needs:   . Film/video editor (Medical):   Marland Kitchen Lack of Transportation (Non-Medical):   Physical Activity:   . Days of Exercise per Week:   . Minutes of Exercise per Session:   Stress:   . Feeling of Stress :   Social Connections:   . Frequency of Communication with Friends and Family:   . Frequency of Social Gatherings with Friends and Family:   . Attends Religious Services:   . Active Member of Clubs or Organizations:   . Attends Archivist Meetings:   Marland Kitchen Marital Status:   Intimate Partner Violence:   . Fear of Current or Ex-Partner:   . Emotionally Abused:   Marland Kitchen Physically Abused:   . 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  Possible chemical exposures, worked in [place] travelled the world but has not lived anywhere for an extended time.   Allergies  Allergen Reactions  . Iodinated Diagnostic Agents Swelling  . Sulfa Antibiotics Swelling    Mouth swelling  . Aleve [Naproxen Sodium]     Doesn't remember  . Apple Swelling  . Banana Nausea And Vomiting  . Beef-Derived Products Nausea And Vomiting  . Erythromycin Swelling    Mouth swells  . Fish Allergy Swelling  . Garlic Swelling  . Sesame Seed (Diagnostic) Swelling  . Shellfish Allergy Other (See Comments)    Not sure of reaction found out about allergy through [place]  . Sulfonamide Derivatives Swelling    Mouth swelling  . Tomato Nausea And Vomiting  . Iodine Swelling    "I blow up like a balloon"  . Strawberry (Diagnostic) Rash  . Sweet Potato Other (See Comments)    Sweet and Regular Potatoes, GI symptoms     Outpatient Medications Prior to Visit    Medication Sig Dispense Refill  . aspirin EC 81 MG tablet Take 1 tablet (81 mg total) by mouth daily. 90 tablet 3  . atorvastatin (LIPITOR) 40 MG tablet Take 1 tablet (40 mg total) by mouth daily. 30 tablet 5  . CIALIS 5 MG tablet Take 5 mg by mouth daily as needed for erectile dysfunction.   98  . fexofenadine (ALLEGRA) 60 MG tablet Take 60 mg by mouth daily.    . halobetasol (ULTRAVATE) 0.05 % cream Apply 1 application topically daily.     . Magnesium 250 MG TABS Take 250 mg by mouth daily.    . metoprolol tartrate (LOPRESSOR) 25 MG tablet Take 1 tablet (25 mg total) by mouth 2 (two) times daily. 180 tablet 3  . nitroGLYCERIN (NITROSTAT) 0.4 MG SL tablet PLACE  1 TABLET UNDER THE TONGUE EVERY 5 MINUTES AS NEEDED FOR CHEST PAIN. MAX 3 DOSES 75 tablet 2  . pantoprazole (PROTONIX) 40 MG tablet TAKE 1 TABLET(40 MG) BY MOUTH DAILY 90 tablet 3  . Probiotic Product (PROBIOTIC DAILY PO) Take 1 capsule by mouth daily.     Alveda Reasons 20 MG TABS tablet TAKE 1 TABLET BY MOUTH EVERY DAY WITH SUPPER 90 tablet 1  . triamcinolone (NASACORT ALLERGY 24HR) 55 MCG/ACT AERO nasal inhaler Place 2 sprays into the nose daily as needed (allergies).      No facility-administered medications prior to visit.        Objective:   Physical Exam Vitals:   12/30/19 1621  BP: 128/66  Pulse: 64  Temp: (!) 97.1 F (36.2 C)  TempSrc: Temporal  SpO2: 100%  Weight: 179 lb 3.2 oz (81.3 kg)  Height: _0  (1.778 m)   Gen: Pleasant, well-nourished, in no distress,  normal affect  ENT: No lesions,  mouth clear,  oropharynx clear, no postnasal drip or blood seen  Neck: No JVD, no stridor  Lungs: No use of accessory muscles, no crackles or wheezing on normal respiration, no wheeze on forced expiration  Cardiovascular: RRR, heart sounds normal, no murmur or gallops, no peripheral edema  Musculoskeletal: No deformities, no cyanosis or clubbing  Neuro: alert, awake, non focal  Skin: Warm, no lesions or rash       Assessment & Plan:  Hemoptysis 3 discrete episodes of unclear cause, not necessarily connected with bronchitic symptoms, pneumonia.  He does not cough frequently.  May be related to the pulmonary nodular disease but unclear, could be a separate issue.  He has occasional epistaxis and also has a history of significant sinusitis (not currently active) which could be contributors.  I believe he needs inspection bronchoscopy, BAL to initiate work-up.  I will also send QuantiFERON gold although my pretest suspicion for TB is low.  Pulmonary nodules/lesions, multiple Migratory pulmonary nodules with a tree-in-bud component, suspicious for mycobacterial colonization.  The resolution of some of the nodules is reassuring, argues against malignancy.  They will need to be followed with serial imaging, next in August 2021.  There may be a unifying diagnosis here, mycobacterial disease may explain episodic cough and hemoptysis as well as pulmonary nodules.  I will arrange for bronchoscopy as above in addition to CTs to follow the nodular disease.    Baltazar Apo, MD, PhD 12/30/2019, 5:13 PM [facility] [phone] or if no answer [phone]

## 2019-12-30 NOTE — H&P (View-Only) (Signed)
Subjective:    Patient ID: Travis Jacobs, male    DOB: 06-Jul-1951, 69 y.o.   MRN: [id number]  HPI 69 year old former smoker (20 pack years) with a history of CAD/PTCI, GERD with hiatal hernia and esophageal strictures (dilated Dr. Henrene Pastor), IBS, paroxysmal atrial fibrillation on Xarelto.  He is referred today for evaluation hemoptysis and pulmonary nodular disease on CT scan of the chest.  He underwent CT chest 02/09/2019, again 08/11/2019 and then most recently 12/06/2019.  I have reviewed all of these.  The original scan identified some apical scarring 9 mm right upper lobe density associated with linear scar.  This area was actually less prominent on scan 08/11/2019, but a new 8 mm nodule was noted in the superior segment of the right lower lobe.  Finally the scan from 12/06/2019 shows stable right upper lobe nodule associated with scar, resolution of 1 right lower lobe nodule but appearance of 6.5 mm hazy groundglass opacity, new 8 mm right lower lobe nodule with some surrounding tree-in-bud changes.  First saw scant blood w mucous in 01/2019, wasn't associated with any other symptoms. He has had two other episodes, both without other sx.  He describes some epistaxis occasionally, but didn't necessarily correlate with the blood. Otherwise minimal cough. Denies fevers or constitutional sx. Has had sinusitis remotely but not recently and not associated with these episodes.    Review of Systems  Constitutional: Negative for fever and unexpected weight change.  HENT: Positive for congestion. Negative for dental problem, ear pain, nosebleeds, postnasal drip, rhinorrhea, sinus pressure, sneezing, sore throat and trouble swallowing.   Eyes: Positive for itching. Negative for redness.  Respiratory: Positive for cough and shortness of breath. Negative for chest tightness and wheezing.   Cardiovascular: Positive for palpitations. Negative for leg swelling.  Gastrointestinal: Negative for nausea and  vomiting.  Genitourinary: Negative for dysuria.  Musculoskeletal: Negative for joint swelling.  Skin: Negative for rash.  Allergic/Immunologic: Positive for environmental allergies and food allergies. Negative for immunocompromised state.  Neurological: Negative for headaches.  Hematological: Does not bruise/bleed easily.  Psychiatric/Behavioral: Negative for dysphoric mood. The patient is not nervous/anxious.     Past Medical History:  Diagnosis Date  . CAD (coronary artery disease)    a. DES to OM1 02/2016.   . Cataract    bil cateracts  . Diverticulosis   . Esophageal stricture   . GERD (gastroesophageal reflux disease)   . Hemorrhoids   . Hiatal hernia   . Hyperlipidemia   . IBS (irritable bowel syndrome)   . Multiple food allergies    "lots of food allergies; beef, tuna, flounder, potatoes, garlic, tomatoes, apples, bananas, sesame seeds, shellfish  . Pre-diabetes   . PVC's (premature ventricular contractions)      Family History  Problem Relation Age of Onset  . Heart disease Mother   . Heart disease Father   . Colon cancer Neg Hx   . Esophageal cancer Neg Hx   . Rectal cancer Neg Hx   . Stomach cancer Neg Hx      Social History   Socioeconomic History  . Marital status: Married    Spouse name: Not on file  . Number of children: Not on file  . Years of education: Not on file  . Highest education level: Not on file  Occupational History  . Not on file  Tobacco Use  . Smoking status: Former Smoker    Packs/day: 1.00    Years: 20.00    Pack years:  20.00    Types: Cigarettes    Quit date: 10/14/1988    Years since quitting: 31.2  . Smokeless tobacco: Never Used  . Tobacco comment: "quit smoking cigarettes in the 1980s"  Substance and Sexual Activity  . Alcohol use: Yes    Alcohol/week: 0.0 standard drinks    Comment: 02/27/2016 "nothing in the last few weeks; no regular pattern"  . Drug use: No  . Sexual activity: Yes  Other Topics Concern  . Not on  file  Social History Narrative  . Not on file   Social Determinants of Health   Financial Resource Strain:   . Difficulty of Paying Living Expenses:   Food Insecurity:   . Worried About Charity fundraiser in the Last Year:   . Arboriculturist in the Last Year:   Transportation Needs:   . Film/video editor (Medical):   Marland Kitchen Lack of Transportation (Non-Medical):   Physical Activity:   . Days of Exercise per Week:   . Minutes of Exercise per Session:   Stress:   . Feeling of Stress :   Social Connections:   . Frequency of Communication with Friends and Family:   . Frequency of Social Gatherings with Friends and Family:   . Attends Religious Services:   . Active Member of Clubs or Organizations:   . Attends Archivist Meetings:   Marland Kitchen Marital Status:   Intimate Partner Violence:   . Fear of Current or Ex-Partner:   . Emotionally Abused:   Marland Kitchen Physically Abused:   . 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  Possible chemical exposures, worked in [place] travelled the world but has not lived anywhere for an extended time.   Allergies  Allergen Reactions  . Iodinated Diagnostic Agents Swelling  . Sulfa Antibiotics Swelling    Mouth swelling  . Aleve [Naproxen Sodium]     Doesn't remember  . Apple Swelling  . Banana Nausea And Vomiting  . Beef-Derived Products Nausea And Vomiting  . Erythromycin Swelling    Mouth swells  . Fish Allergy Swelling  . Garlic Swelling  . Sesame Seed (Diagnostic) Swelling  . Shellfish Allergy Other (See Comments)    Not sure of reaction found out about allergy through [place]  . Sulfonamide Derivatives Swelling    Mouth swelling  . Tomato Nausea And Vomiting  . Iodine Swelling    "I blow up like a balloon"  . Strawberry (Diagnostic) Rash  . Sweet Potato Other (See Comments)    Sweet and Regular Potatoes, GI symptoms     Outpatient Medications Prior to Visit    Medication Sig Dispense Refill  . aspirin EC 81 MG tablet Take 1 tablet (81 mg total) by mouth daily. 90 tablet 3  . atorvastatin (LIPITOR) 40 MG tablet Take 1 tablet (40 mg total) by mouth daily. 30 tablet 5  . CIALIS 5 MG tablet Take 5 mg by mouth daily as needed for erectile dysfunction.   98  . fexofenadine (ALLEGRA) 60 MG tablet Take 60 mg by mouth daily.    . halobetasol (ULTRAVATE) 0.05 % cream Apply 1 application topically daily.     . Magnesium 250 MG TABS Take 250 mg by mouth daily.    . metoprolol tartrate (LOPRESSOR) 25 MG tablet Take 1 tablet (25 mg total) by mouth 2 (two) times daily. 180 tablet 3  . nitroGLYCERIN (NITROSTAT) 0.4 MG SL tablet PLACE  1 TABLET UNDER THE TONGUE EVERY 5 MINUTES AS NEEDED FOR CHEST PAIN. MAX 3 DOSES 75 tablet 2  . pantoprazole (PROTONIX) 40 MG tablet TAKE 1 TABLET(40 MG) BY MOUTH DAILY 90 tablet 3  . Probiotic Product (PROBIOTIC DAILY PO) Take 1 capsule by mouth daily.     Alveda Reasons 20 MG TABS tablet TAKE 1 TABLET BY MOUTH EVERY DAY WITH SUPPER 90 tablet 1  . triamcinolone (NASACORT ALLERGY 24HR) 55 MCG/ACT AERO nasal inhaler Place 2 sprays into the nose daily as needed (allergies).      No facility-administered medications prior to visit.        Objective:   Physical Exam Vitals:   12/30/19 1621  BP: 128/66  Pulse: 64  Temp: (!) 97.1 F (36.2 C)  TempSrc: Temporal  SpO2: 100%  Weight: 179 lb 3.2 oz (81.3 kg)  Height: _0  (1.778 m)   Gen: Pleasant, well-nourished, in no distress,  normal affect  ENT: No lesions,  mouth clear,  oropharynx clear, no postnasal drip or blood seen  Neck: No JVD, no stridor  Lungs: No use of accessory muscles, no crackles or wheezing on normal respiration, no wheeze on forced expiration  Cardiovascular: RRR, heart sounds normal, no murmur or gallops, no peripheral edema  Musculoskeletal: No deformities, no cyanosis or clubbing  Neuro: alert, awake, non focal  Skin: Warm, no lesions or rash       Assessment & Plan:  Hemoptysis 3 discrete episodes of unclear cause, not necessarily connected with bronchitic symptoms, pneumonia.  He does not cough frequently.  May be related to the pulmonary nodular disease but unclear, could be a separate issue.  He has occasional epistaxis and also has a history of significant sinusitis (not currently active) which could be contributors.  I believe he needs inspection bronchoscopy, BAL to initiate work-up.  I will also send QuantiFERON gold although my pretest suspicion for TB is low.  Pulmonary nodules/lesions, multiple Migratory pulmonary nodules with a tree-in-bud component, suspicious for mycobacterial colonization.  The resolution of some of the nodules is reassuring, argues against malignancy.  They will need to be followed with serial imaging, next in August 2021.  There may be a unifying diagnosis here, mycobacterial disease may explain episodic cough and hemoptysis as well as pulmonary nodules.  I will arrange for bronchoscopy as above in addition to CTs to follow the nodular disease.    Baltazar Apo, MD, PhD 12/30/2019, 5:13 PM [facility] [phone] or if no answer [phone]

## 2019-12-31 ENCOUNTER — Telehealth: Payer: Self-pay | Admitting: *Deleted

## 2019-12-31 NOTE — Telephone Encounter (Signed)
-----   Message from Antionette Fairy sent at 12/31/2019  9:36 AM EDT ----- Regarding: bronch Please schedule the following:  Diagnosis: hemoptysis Procedure: FOB without fluoro, no TB risk  Date: 01/12/20 Alternate Date: 01/11/20  Time: PM Location: [facility] Medication Restriction: stop xarelto 2 days before procedure Anticoagulate/Antiplatelet: xarelto as above   Please coordinate Pre-op COVID Testing

## 2019-12-31 NOTE — Telephone Encounter (Signed)
Called and spoke with patient. Let them know their Bronch is scheduled for January 11, 2020 with Dr. Delton Coombes at 1300 at [facility].  Patient was instructed to arrive at hospital at 1130. They were instructed to bring someone with them as they will not be able to drive home from procedure. Patient instructed not to have anything to eat or drink after midnight. Patient needs to hold their blood thinner Xarelto , 2 days prior to procedure.   Patient's covid screening is scheduled at [facility] for January 08, 2020 at 0900.  Patient voiced understanding, nothing further needed  Routing to Dr. Delton Coombes as Lorain Childes

## 2020-01-01 LAB — QUANTIFERON-TB GOLD PLUS
Mitogen-NIL: >10 IU/mL
NIL: 0.03 IU/mL
QuantiFERON-TB Gold Plus: NEGATIVE
TB1-NIL: 0 IU/mL
TB2-NIL: <0 IU/mL

## 2020-01-07 ENCOUNTER — Other Ambulatory Visit: Payer: Self-pay

## 2020-01-07 ENCOUNTER — Encounter (HOSPITAL_COMMUNITY): Payer: Self-pay | Admitting: Emergency Medicine

## 2020-01-07 NOTE — Progress Notes (Signed)
Pre-op phone call complete.  Cardio: Dr. Clifton James, sees yearly, has next appt "soon" PCP Dr. Charlane Ferretti, [facility]--new primary since former PCP just retired  NPO after MN, instructed to hold Xarelto 2 days per MD, no NSAIDs/vitamins/supplements, no lotions/deodorant/jewelry/valuables  Denies any fever, CP, SOB, denies any COVID exposure.  Going for COVID test at [facility] 01/08/20  Chart forwarded to anesthesia: Hx CAD

## 2020-01-08 ENCOUNTER — Other Ambulatory Visit (HOSPITAL_COMMUNITY)
Admission: RE | Admit: 2020-01-08 | Discharge: 2020-01-08 | Disposition: A | Discharge status: Home / Self Care | Payer: Medicare Other | Source: Ambulatory Visit | Attending: Emergency Medicine | Admitting: Emergency Medicine

## 2020-01-08 DIAGNOSIS — Z20822 Contact with and (suspected) exposure to covid-19: Secondary | ICD-10-CM | POA: Diagnosis not present

## 2020-01-08 DIAGNOSIS — Z01812 Encounter for preprocedural laboratory examination: Secondary | ICD-10-CM | POA: Insufficient documentation

## 2020-01-08 LAB — SARS CORONAVIRUS 2 (TAT 6-24 HRS): SARS Coronavirus 2: NEGATIVE

## 2020-01-10 NOTE — Progress Notes (Signed)
Anesthesia Chart Review: Same day workup  Follows with cardiology for hx of CAD (s/p DES to obtuse marginal branch of the Circumflex in May 2017), paroxysmal afib on Xarelto.. Normal LV function by echo April 2018. Last seen by Dr. Julianne Handler 12/14/18, doing well, advised 24yrfollowup.   History of hemoptysis, has been followed with serial CT scans. Per Dr. BAgustina Carolinote 12/30/19 "Migratory pulmonary nodules with a tree-in-bud component, suspicious for mycobacterial colonization. The resolution of some of the nodules is reassuring, argues against malignancy. "  Will need DOS labs, eval, and EKG.  EKG 12/14/18: NSR, Rate 62.  CT chest 12/06/19: IMPRESSION: 1. Stable right upper lobe pulmonary nodule. 2. New superior segment right lower lobe pulmonary nodule measuring 6.5 mm. 3. Stable and new areas of peripheral tree-in-bud type changes and hazy ground-glass attenuation. This is most likely secondary to chronic inflammatory process or atypical infection such as MAC. This would also explain the waxing and waning airspace nodules. Recommend clinical correlation. Recommend follow-up noncontrast chest CT in 4-6 months. 4. Stable emphysematous changes and pulmonary scarring. 5. No mediastinal or hilar mass or adenopathy. 6. Stable advanced three-vessel coronary artery calcifications.  TTE 01/15/17: - Left ventricle: The cavity size was normal. Wall thickness was  normal. Systolic function was normal. The estimated ejection  fraction was in the range of 60% to 65%. Wall motion was normal;  there were no regional wall motion abnormalities. Doppler  parameters are consistent with abnormal left ventricular  relaxation (grade 1 diastolic dysfunction). The E/e&' ratio is  between 8-15, suggesting indeterminate LV filling pressure.  - Mitral valve: Mildly thickened leaflets . There was trivial  regurgitation.  - Left atrium: The atrium was normal in size.  - Atrial septum: No defect or  patent foramen ovale was identified.  - Tricuspid valve: There was mild regurgitation.  - Pulmonary arteries: PA peak pressure: 38 mm Hg (S).  - Inferior vena cava: The vessel was normal in size. The  respirophasic diameter changes were in the normal range (>= 50%),  consistent with normal central venous pressure.   Impressions:   - LVEF 60-65%, normal wall thickness, normal wall motion, grade 1  DD with indeterminate LV filling pressure, normal LA size, upper  normal RA size, trivial MR, mild TR, RVSP 38 mmHg, normal IVC.     JWynonia MustyMSpokane Va Medical CenterShort Stay Center/Anesthesiology Phone ([phone]/29/2021 10:12 AM

## 2020-01-10 NOTE — Anesthesia Preprocedure Evaluation (Addendum)
Anesthesia Evaluation  Patient identified by MRN, date of birth, ID band Patient awake    Reviewed: Allergy & Precautions, NPO status , Patient's Chart, lab work & pertinent test results  Airway Mallampati: I  TM Distance: >3 FB Neck ROM: Full    Dental no notable dental hx. (+) Teeth Intact, Dental Advisory Given, Caps,    Pulmonary neg pulmonary ROS, former smoker,    Pulmonary exam normal breath sounds clear to auscultation       Cardiovascular + angina + CAD  Normal cardiovascular exam+ dysrhythmias Atrial Fibrillation  Rhythm:Regular Rate:Normal  CAD- s/p PCI +DES to OM1 02/28/16   Neuro/Psych negative neurological ROS  negative psych ROS   GI/Hepatic Neg liver ROS, hiatal hernia, GERD  ,  Endo/Other  negative endocrine ROS  Renal/GU negative Renal ROS  negative genitourinary   Musculoskeletal negative musculoskeletal ROS (+)   Abdominal   Peds negative pediatric ROS (+)  Hematology negative hematology ROS (+)   Anesthesia Other Findings   Reproductive/Obstetrics negative OB ROS                           Anesthesia Physical Anesthesia Plan  ASA: II  Anesthesia Plan: MAC   Post-op Pain Management:    Induction: Intravenous  PONV Risk Score and Plan:   Airway Management Planned: Mask, Natural Airway and Nasal Cannula  Additional Equipment:   Intra-op Plan:   Post-operative Plan:   Informed Consent:   Plan Discussed with: Anesthesiologist and CRNA  Anesthesia Plan Comments: (Follows with cardiology for hx of CAD (s/p DES to obtuse marginal branch of the Circumflex in May 2017), paroxysmal afib on Xarelto.. Normal LV function by echo April 2018. Last seen by Dr. Julianne Handler 12/14/18, doing well, advised 41yrfollowup.   History of hemoptysis, has been followed with serial CT scans. Per Dr. BAgustina Carolinote 12/30/19 "Migratory pulmonary nodules with a tree-in-bud component,  suspicious for mycobacterial colonization. The resolution of some of the nodules is reassuring, argues against malignancy. "  Will need DOS labs, eval, and EKG.  EKG 12/14/18: NSR, Rate 62.  CT chest 12/06/19: IMPRESSION: 1. Stable right upper lobe pulmonary nodule. 2. New superior segment right lower lobe pulmonary nodule measuring 6.5 mm. 3. Stable and new areas of peripheral tree-in-bud type changes and hazy ground-glass attenuation. This is most likely secondary to chronic inflammatory process or atypical infection such as MAC. This would also explain the waxing and waning airspace nodules. Recommend clinical correlation. Recommend follow-up noncontrast chest CT in 4-6 months. 4. Stable emphysematous changes and pulmonary scarring. 5. No mediastinal or hilar mass or adenopathy. 6. Stable advanced three-vessel coronary artery calcifications.  TTE 01/15/17: - Left ventricle: The cavity size was normal. Wall thickness was  normal. Systolic function was normal. The estimated ejection  fraction was in the range of 60% to 65%. Wall motion was normal;  there were no regional wall motion abnormalities. Doppler  parameters are consistent with abnormal left ventricular  relaxation (grade 1 diastolic dysfunction). The E/e&' ratio is  between 8-15, suggesting indeterminate LV filling pressure.  - Mitral valve: Mildly thickened leaflets . There was trivial  regurgitation.  - Left atrium: The atrium was normal in size.  - Atrial septum: No defect or patent foramen ovale was identified.  - Tricuspid valve: There was mild regurgitation.  - Pulmonary arteries: PA peak pressure: 38 mm Hg (S).  - Inferior vena cava: The vessel was normal in size. The  respirophasic diameter changes were in the normal range (>= 50%),  consistent with normal central venous pressure.   Impressions:   - LVEF 60-65%, normal wall thickness, normal wall motion, grade 1  DD with indeterminate LV  filling pressure, normal LA size, upper  normal RA size, trivial MR, mild TR, RVSP 38 mmHg, normal IVC.  )      Anesthesia Quick Evaluation

## 2020-01-11 ENCOUNTER — Ambulatory Visit (HOSPITAL_COMMUNITY): Payer: Medicare Other | Admitting: Physician Assistant

## 2020-01-11 ENCOUNTER — Ambulatory Visit (HOSPITAL_COMMUNITY)
Admission: RE | Admit: 2020-01-11 | Discharge: 2020-01-11 | Disposition: A | Discharge status: Home / Self Care | Payer: Medicare Other | Attending: Emergency Medicine | Admitting: Emergency Medicine

## 2020-01-11 ENCOUNTER — Encounter (HOSPITAL_COMMUNITY)
Admission: RE | Disposition: A | Discharge status: Home / Self Care | Payer: Self-pay | Source: Home / Self Care | Attending: Emergency Medicine

## 2020-01-11 ENCOUNTER — Encounter (HOSPITAL_COMMUNITY): Payer: Self-pay | Admitting: Emergency Medicine

## 2020-01-11 ENCOUNTER — Other Ambulatory Visit: Payer: Self-pay

## 2020-01-11 DIAGNOSIS — R042 Hemoptysis: Secondary | ICD-10-CM | POA: Insufficient documentation

## 2020-01-11 DIAGNOSIS — Z7901 Long term (current) use of anticoagulants: Secondary | ICD-10-CM | POA: Insufficient documentation

## 2020-01-11 DIAGNOSIS — E785 Hyperlipidemia, unspecified: Secondary | ICD-10-CM | POA: Insufficient documentation

## 2020-01-11 DIAGNOSIS — Z87891 Personal history of nicotine dependence: Secondary | ICD-10-CM | POA: Insufficient documentation

## 2020-01-11 DIAGNOSIS — Z955 Presence of coronary angioplasty implant and graft: Secondary | ICD-10-CM | POA: Insufficient documentation

## 2020-01-11 DIAGNOSIS — R918 Other nonspecific abnormal finding of lung field: Secondary | ICD-10-CM | POA: Diagnosis not present

## 2020-01-11 DIAGNOSIS — I2510 Atherosclerotic heart disease of native coronary artery without angina pectoris: Secondary | ICD-10-CM | POA: Diagnosis not present

## 2020-01-11 DIAGNOSIS — K219 Gastro-esophageal reflux disease without esophagitis: Secondary | ICD-10-CM | POA: Insufficient documentation

## 2020-01-11 DIAGNOSIS — K5790 Diverticulosis of intestine, part unspecified, without perforation or abscess without bleeding: Secondary | ICD-10-CM | POA: Diagnosis not present

## 2020-01-11 DIAGNOSIS — Z7982 Long term (current) use of aspirin: Secondary | ICD-10-CM | POA: Insufficient documentation

## 2020-01-11 DIAGNOSIS — Z79899 Other long term (current) drug therapy: Secondary | ICD-10-CM | POA: Insufficient documentation

## 2020-01-11 DIAGNOSIS — K589 Irritable bowel syndrome without diarrhea: Secondary | ICD-10-CM | POA: Diagnosis not present

## 2020-01-11 DIAGNOSIS — I480 Paroxysmal atrial fibrillation: Secondary | ICD-10-CM | POA: Insufficient documentation

## 2020-01-11 HISTORY — PX: BRONCHIAL WASHINGS: SHX5105

## 2020-01-11 HISTORY — DX: Unspecified atrial fibrillation: I48.91

## 2020-01-11 HISTORY — PX: VIDEO BRONCHOSCOPY: SHX5072

## 2020-01-11 LAB — BASIC METABOLIC PANEL
Anion gap: 10 (ref 5–15)
BUN: 16 mg/dL (ref 8–23)
CO2: 24 mmol/L (ref 22–32)
Calcium: 9.4 mg/dL (ref 8.9–10.3)
Chloride: 104 mmol/L (ref 98–111)
Creatinine, Ser: 0.87 mg/dL (ref 0.61–1.24)
GFR calc Af Amer: >60 mL/min (ref >60)
GFR calc non Af Amer: >60 mL/min (ref >60)
Glucose, Bld: 106 mg/dL — ABNORMAL HIGH (ref 70–99)
Potassium: 4.1 mmol/L (ref 3.5–5.1)
Sodium: 138 mmol/L (ref 135–145)

## 2020-01-11 LAB — BODY FLUID CELL COUNT WITH DIFFERENTIAL
Eos, Fluid: 0 %
Lymphs, Fluid: 43 %
Monocyte-Macrophage-Serous Fluid: 24 % — ABNORMAL LOW (ref 50–90)
Neutrophil Count, Fluid: 33 % — ABNORMAL HIGH (ref 0–25)
Total Nucleated Cell Count, Fluid: 44 cu mm (ref 0–1000)

## 2020-01-11 LAB — CBC
HCT: 40.1 % (ref 39.0–52.0)
Hemoglobin: 12.4 g/dL — ABNORMAL LOW (ref 13.0–17.0)
MCH: 26.1 pg (ref 26.0–34.0)
MCHC: 30.9 g/dL (ref 30.0–36.0)
MCV: 84.4 fL (ref 80.0–100.0)
Platelets: 292 10*3/uL (ref 150–400)
RBC: 4.75 MIL/uL (ref 4.22–5.81)
RDW: 13.2 % (ref 11.5–15.5)
WBC: 8.8 10*3/uL (ref 4.0–10.5)
nRBC: 0 % (ref 0.0–0.2)

## 2020-01-11 LAB — PROTIME-INR
INR: 1.2 (ref 0.8–1.2)
Prothrombin Time: 14.7 seconds (ref 11.4–15.2)

## 2020-01-11 SURGERY — VIDEO BRONCHOSCOPY WITHOUT FLUORO
Anesthesia: Monitor Anesthesia Care

## 2020-01-11 MED ORDER — ATORVASTATIN CALCIUM 40 MG PO TABS: 40.0000 mg | ORAL_TABLET | Freq: Every evening | ORAL | Status: DC

## 2020-01-11 MED ORDER — RIVAROXABAN 20 MG PO TABS: 20.0000 mg | ORAL_TABLET | Freq: Every evening | ORAL | Status: DC

## 2020-01-11 MED ORDER — BUTAMBEN-TETRACAINE-BENZOCAINE 2-2-14 % EX AERO
INHALATION_SPRAY | CUTANEOUS | Status: DC | PRN
  Administered 2020-01-11: 2 via TOPICAL

## 2020-01-11 MED ORDER — NITROGLYCERIN 0.4 MG SL SUBL: 0.4000 mg | SUBLINGUAL_TABLET | SUBLINGUAL | Status: DC | PRN

## 2020-01-11 MED ORDER — LIDOCAINE HCL 1 % IJ SOLN
INTRAMUSCULAR | Status: DC | PRN
  Administered 2020-01-11: 10 mL

## 2020-01-11 MED ORDER — LACTATED RINGERS IV SOLN: INTRAVENOUS | Status: DC

## 2020-01-11 MED ORDER — PROPOFOL 10 MG/ML IV BOLUS
INTRAVENOUS | Status: DC | PRN
  Administered 2020-01-11: 100 ug/kg/min via INTRAVENOUS

## 2020-01-11 MED ORDER — FENTANYL CITRATE (PF) 250 MCG/5ML IJ SOLN
INTRAMUSCULAR | Status: DC | PRN
  Administered 2020-01-11: 50 ug via INTRAVENOUS
  Administered 2020-01-11: 25 ug via INTRAVENOUS

## 2020-01-11 MED ORDER — PANTOPRAZOLE SODIUM 40 MG PO TBEC: 40.0000 mg | DELAYED_RELEASE_TABLET | Freq: Every day | ORAL | Status: AC

## 2020-01-11 MED ORDER — MIDAZOLAM HCL 5 MG/5ML IJ SOLN
INTRAMUSCULAR | Status: DC | PRN
  Administered 2020-01-11: 2 mg via INTRAVENOUS

## 2020-01-11 NOTE — Discharge Instructions (Signed)
Flexible Bronchoscopy, Care After This sheet gives you information about how to care for yourself after your test. Your doctor may also give you more specific instructions. If you have problems or questions, contact your doctor. Follow these instructions at home: Eating and drinking  Do not eat or drink anything (not even water) for 2 hours after your test, or until your numbing medicine (local anesthetic) wears off.  When your numbness is gone and your cough and gag reflexes have come back, you may: ? Eat only soft foods. ? Slowly drink liquids.  The day after the test, go back to your normal diet. Driving  Do not drive for 24 hours if you were given a medicine to help you relax (sedative).  Do not drive or use heavy machinery while taking prescription pain medicine. General instructions   Take over-the-counter and prescription medicines only as told by your doctor.  Return to your normal activities as told. Ask what activities are safe for you.  Do not use any products that have nicotine or tobacco in them. This includes cigarettes and e-cigarettes. If you need help quitting, ask your doctor.  Keep all follow-up visits as told by your doctor. This is important. It is very important if you had a tissue sample (biopsy) taken. Get help right away if:  You have shortness of breath that gets worse.  You get light-headed.  You feel like you are going to pass out (faint).  You have chest pain.  You cough up: ? More than a little blood. ? More blood than before. Summary  Do not eat or drink anything (not even water) for 2 hours after your test, or until your numbing medicine wears off.  Do not use cigarettes. Do not use e-cigarettes.  Get help right away if you have chest pain.   Please call our office for any questions or concerns.  [phone].  This information is not intended to replace advice given to you by your health care provider. Make sure you discuss any  questions you have with your health care provider. Document Revised: 09/12/2017 Document Reviewed: 10/18/2016 Elsevier Patient Education  2020 [employer].

## 2020-01-11 NOTE — Transfer of Care (Signed)
Immediate Anesthesia Transfer of Care Note  Patient: Travis Jacobs  Procedure(s) Performed: VIDEO BRONCHOSCOPY WITHOUT FLUORO (N/A ) BRONCHIAL WASHINGS  Patient Location: PACU  Anesthesia Type:MAC  Level of Consciousness: awake, alert  and oriented  Airway & Oxygen Therapy: Patient Spontanous Breathing and Patient connected to nasal cannula oxygen  Post-op Assessment: Report given to RN and Post -op Vital signs reviewed and stable  Post vital signs: Reviewed and stable  Last Vitals:  Vitals Value Taken Time  BP 113/73 01/11/20 1254  Temp    Pulse 58 01/11/20 1255  Resp 17 01/11/20 1255  SpO2 99 % 01/11/20 1255  Vitals shown include unvalidated device data.  Last Pain:  Vitals:   01/11/20 1112  TempSrc:   PainSc: 0-No pain      Patients Stated Pain Goal: 6 (50/53/97 6734)  Complications: No apparent anesthesia complications

## 2020-01-11 NOTE — Op Note (Signed)
[facility] Cardiopulmonary Patient Name: Travis Jacobs Date: 01/11/2020 MRN: [id number] Attending MD: Collene Gobble , MD Date of Birth: [birth year] CSN: Finalized Age: 69 Admit Type: Inpatient Gender: Male Procedure:             Bronchoscopy Indications:           Hemoptysis with normal CXR, Multiple pulmonary nodules Providers:             Collene Gobble, MD, Vista Lawman, RN, Lina Sar,                         Technician, Merrilyn Puma, CRNA Referring MD:           Medicines:             Monitored Anesthesia Care Complications:         No immediate complications Estimated Blood Loss:  Estimated blood loss: none. Procedure:             Pre-Anesthesia Assessment:                        - A History and Physical has been performed. Patient                         meds and allergies have been reviewed. The risks and                         benefits of the procedure and the sedation options and                         risks were discussed with the patient. All questions                         were answered and informed consent was obtained.                         Patient identification and proposed procedure were                         verified prior to the procedure by the physician in                         the pre-procedure area. Mental Status Examination:                         alert and oriented. Airway Examination: normal                         oropharyngeal airway. Respiratory Examination: clear                         to auscultation. CV Examination: RRR, no murmurs, no                         S3 or S4. Prior Anticoagulants: The patient has taken                         Eliquis (apixaban), last dose was 2 days prior to  procedure. ASA Grade Assessment: I - A normal healthy                         patient. After reviewing the risks and benefits, the                         patient was deemed in satisfactory condition to                          undergo the procedure. The anesthesia plan was to use                         deep sedation / analgesia. Immediately prior to                         administration of medications, the patient was                         re-assessed for adequacy to receive sedatives. The                         heart rate, respiratory rate, oxygen saturations,                         blood pressure, adequacy of pulmonary ventilation, and                         response to care were monitored throughout the                         procedure. The physical status of the patient was                         re-assessed after the procedure.                        After obtaining informed consent, the bronchoscope was                         passed under direct vision. Throughout the procedure,                         the patient's blood pressure, pulse, and oxygen                         saturations were monitored continuously. the [id number]                         ([id number]) Olympus Therapeutic Bronchoscope was                         introduced through the mouth and advanced to the                         tracheobronchial tree. The procedure was accomplished                         without difficulty. The patient tolerated the  procedure well. Scope In: Scope Out: Findings:      The oropharynx appears normal. The larynx appears normal. The vocal       cords appear normal. The subglottic space is normal. The trachea is of       normal caliber. The carina is sharp. The tracheobronchial tree was       examined to at least the first subsegmental level. Bronchial mucosa and       anatomy are normal; there are no endobronchial lesions, and no       secretions.      Bronchoalveolar lavage was performed in the RLL superior segment (B6) of       the lung and sent for cell count, bacterial culture, viral smears &       culture, and fungal & AFB analysis and cytology. 120  mL of fluid were       instilled. 25 mL were returned. The return was clear. There were no       mucoid plugs in the return fluid. Multiple specimens were obtained and       pooled into one specimen, which was sent for analysis. Impression:            - Hemoptysis with normal CXR                        - Multiple pulmonary nodules                        - The airway examination was normal.                        - Bronchoalveolar lavage was performed. Moderate Sedation:      Performed under MAC Recommendation:        - Await BAL results. Procedure Code(s):     --- Professional ---                        [phone], Bronchoscopy, rigid or flexible, including                         fluoroscopic guidance, when performed; with bronchial                         alveolar lavage Diagnosis Code(s):     --- Professional ---                        R04.2, Hemoptysis                        R91.8, Other nonspecific abnormal finding of lung field CPT copyright 2019 [employer]. All rights reserved. The codes documented in this report are preliminary and upon coder review may  be revised to meet current compliance requirements. Collene Gobble, MD Collene Gobble, MD 01/11/2020 12:57:41 PM Number of Addenda: 0

## 2020-01-11 NOTE — Interval H&P Note (Signed)
PCCM Interval Note  Pt presents today for further eval of hemoptysis and micronodular disease.  No significant change since our original visit. He has not seen any hemoptysis since then, no epistaxis.   Planning for FOB with inspection and BAL.   He understands the procedure, all questions answered. No barriers to proceeding.   Baltazar Apo, MD, PhD 01/11/2020, 12:28 PM [facility] [phone] or if no answer [phone]

## 2020-01-11 NOTE — Anesthesia Postprocedure Evaluation (Signed)
Anesthesia Post Note  Patient: Travis Jacobs  Procedure(s) Performed: VIDEO BRONCHOSCOPY WITHOUT FLUORO (N/A ) BRONCHIAL WASHINGS     Patient location during evaluation: Endoscopy Anesthesia Type: MAC Level of consciousness: awake and alert Pain management: pain level controlled Vital Signs Assessment: post-procedure vital signs reviewed and stable Respiratory status: spontaneous breathing, nonlabored ventilation, respiratory function stable and patient connected to nasal cannula oxygen Cardiovascular status: stable and blood pressure returned to baseline Postop Assessment: no apparent nausea or vomiting Anesthetic complications: no    Last Vitals:  Vitals:   01/11/20 1308 01/11/20 1324  BP: 123/72 114/70  Pulse: (!) 57 (!) 50  Resp: 17 17  Temp:    SpO2: 100% 99%    Last Pain:  Vitals:   01/11/20 1324  TempSrc:   PainSc: 0-No pain                 Catalina Gravel

## 2020-01-12 LAB — ACID FAST SMEAR (AFB, MYCOBACTERIA): Acid Fast Smear: NEGATIVE

## 2020-01-12 LAB — CYTOLOGY - NON PAP

## 2020-01-13 LAB — CULTURE, RESPIRATORY W GRAM STAIN

## 2020-01-13 NOTE — Telephone Encounter (Signed)
RB- pt asking about bronch results, specifically "do I have ecoli" please advise thanks

## 2020-01-13 NOTE — Telephone Encounter (Signed)
Please let the patient know I have reviewed his labs so far. His cytology and cells counts are normal.   The bacterial culture shows few e coli mixed with normal mouth flora. This is almost certainly a contaminant. It is would be very unusual to have an e coli bronchitis, for example. The areas that we have discussed on his CT chest are inconsistent with E coli.   We can review in more detail going forward, but this test result is probably a false positive. Thanks

## 2020-01-24 ENCOUNTER — Ambulatory Visit: Payer: Medicare Other | Admitting: Cardiovascular Disease

## 2020-01-27 NOTE — Progress Notes (Signed)
Chief Complaint  Patient presents with  . Follow-up    CAD   History of Present Illness: 69 yo male with history of CAD, paroxysmal atrial fibrillation, PVCs, HLD and GERD who is here today for cardiac follow up. He had a cardiac cath in May 2017 in the setting of unstable angina which showed a severe stenosis in the first obtuse marginal branch. A drug eluting stent was placed in the first OM. There was mild disease in the RCA and LAD. LV function was normal.  Frequent PVCs noted during his hospital stay. Cardiac monitor June 2017 with frequent PVCS and periods of sinus bradycardia. He was seen in the EP clinic by Dr. Graciela Husbands and he recommended the patient continue his beta blocker. He called our office complaining of palpitations at home mid December 2018 with HR up to the 150s. Cardiac monitor December 2018 showed sinus bradycardia and episodes of atrial fibrillation/flutter. He is now on a beta blocker and Xarelto. He has been followed in pulmonary after mild hemoptysis.   He is here today for follow up. The patient denies any chest pain, dyspnea, palpitations, lower extremity edema, orthopnea, PND, dizziness, near syncope or syncope.   Primary Care Physician: Charlane Ferretti, DO  Past Medical History:  Diagnosis Date  . A-fib (HCC)   . CAD (coronary artery disease)    a. DES to OM1 02/2016.   . Cataract    bil cateracts  . Diverticulosis   . Esophageal stricture   . GERD (gastroesophageal reflux disease)   . Hemorrhoids   . Hiatal hernia   . Hyperlipidemia   . IBS (irritable bowel syndrome)   . Multiple food allergies    "lots of food allergies; beef, tuna, flounder, potatoes, garlic, tomatoes, apples, bananas, sesame seeds, shellfish  . Pre-diabetes   . PVC's (premature ventricular contractions)     Past Surgical History:  Procedure Laterality Date  . BRONCHIAL WASHINGS  01/11/2020   Procedure: BRONCHIAL WASHINGS;  Surgeon: Leslye Peer, MD;  Location: [facility] ENDOSCOPY;   Service: Cardiopulmonary;;  . CARDIAC CATHETERIZATION N/A 02/28/2016   Procedure: Left Heart Cath and Coronary Angiography;  Surgeon: Iran Ouch, MD;  Location: [facility] INVASIVE CV LAB;  Service: Cardiovascular;  Laterality: N/A;  . CARDIAC CATHETERIZATION N/A 02/28/2016   Procedure: Coronary Stent Intervention;  Surgeon: Iran Ouch, MD;  Location: [facility] INVASIVE CV LAB;  Service: Cardiovascular;  Laterality: N/A;  . COLONOSCOPY    . CYSTOSCOPY WITH STENT PLACEMENT    . ESOPHAGOGASTRODUODENOSCOPY (EGD) WITH ESOPHAGEAL DILATION    . HEMORRHOID BANDING    . HERNIA REPAIR    . SURGERY SCROTAL / TESTICULAR Left ~ 1960   "hernia"  . TONSILLECTOMY    . UPPER GASTROINTESTINAL ENDOSCOPY    . VIDEO BRONCHOSCOPY N/A 01/11/2020   Procedure: VIDEO BRONCHOSCOPY WITHOUT FLUORO;  Surgeon: Leslye Peer, MD;  Location: [facility] ENDOSCOPY;  Service: Cardiopulmonary;  Laterality: N/A;    Current Outpatient Medications  Medication Sig Dispense Refill  . acetaminophen (TYLENOL) 500 MG tablet Take 500 mg by mouth every 8 (eight) hours as needed for moderate pain or headache.    . Apoaequorin (PREVAGEN EXTRA STRENGTH) 20 MG CAPS Take 20 mg by mouth daily.    Marland Kitchen aspirin EC 81 MG tablet Take 1 tablet (81 mg total) by mouth daily. 90 tablet 3  . atorvastatin (LIPITOR) 40 MG tablet Take 1 tablet (40 mg total) by mouth every evening.    . Calcium Polycarbophil (FIBER-CAPS PO)  Take 1 capsule by mouth every evening.    Marland Kitchen CIALIS 5 MG tablet Take 5 mg by mouth daily as needed for erectile dysfunction.   98  . docusate sodium (COLACE) 100 MG capsule Take 300 mg by mouth 2 (two) times daily.    Marland Kitchen ELDERBERRY PO Take 1 capsule by mouth daily.    . fexofenadine (ALLEGRA) 180 MG tablet Take 180 mg by mouth daily.    . halobetasol (ULTRAVATE) 0.05 % cream Apply 1 application topically daily as needed (hemorrhoids).     Marland Kitchen ketotifen (ZADITOR) 0.025 % ophthalmic solution Place 1 drop into both eyes 2 (two) times daily.    .  Magnesium 250 MG TABS Take 250 mg by mouth in the morning and at bedtime.     . metoprolol tartrate (LOPRESSOR) 25 MG tablet Take 1 tablet (25 mg total) by mouth 2 (two) times daily. 180 tablet 3  . Multiple Vitamin (MULTIVITAMIN WITH MINERALS) TABS tablet Take 1 tablet by mouth at bedtime.    . nitroGLYCERIN (NITROSTAT) 0.4 MG SL tablet Place 1 tablet (0.4 mg total) under the tongue every 5 (five) minutes x 3 doses as needed for chest pain.    . pantoprazole (PROTONIX) 40 MG tablet Take 1 tablet (40 mg total) by mouth daily.    . Probiotic Product (PROBIOTIC DAILY PO) Take 1 capsule by mouth daily.     . rivaroxaban (XARELTO) 20 MG TABS tablet Take 1 tablet (20 mg total) by mouth every evening. Okay to restart on 01/11/2020.     No current facility-administered medications for this visit.    Allergies  Allergen Reactions  . Iodinated Diagnostic Agents Swelling  . Sulfa Antibiotics Swelling    Mouth swelling  . Aleve [Naproxen Sodium]     Doesn't remember  . Apple Swelling  . Banana Nausea And Vomiting  . Beef-Derived Products Nausea And Vomiting  . Erythromycin Swelling    Mouth swells  . Fish Allergy Swelling  . Garlic Swelling  . Sesame Seed (Diagnostic) Swelling  . Shellfish Allergy Other (See Comments)    Not sure of reaction found out about allergy through [place]  . Sulfonamide Derivatives Swelling    Mouth swelling  . Tomato Nausea And Vomiting    welts  . Iodine Swelling    "I blow up like a balloon"  . Strawberry (Diagnostic) Rash  . Sweet Potato Other (See Comments)    Sweet and Regular Potatoes, GI symptoms    Social History   Socioeconomic History  . Marital status: Married    Spouse name: Not on file  . Number of children: Not on file  . Years of education: Not on file  . Highest education level: Not on file  Occupational History  . Not on file  Tobacco Use  . Smoking status: Former Smoker    Packs/day: 1.00    Years: 20.00    Pack years: 20.00     Types: Cigarettes    Quit date: 10/14/1988    Years since quitting: 31.3  . Smokeless tobacco: Never Used  . Tobacco comment: "quit smoking cigarettes in the 1980s"  Substance and Sexual Activity  . Alcohol use: Yes    Alcohol/week: 0.0 standard drinks    Comment: 02/27/2016 "nothing in the last few weeks; no regular pattern"  . Drug use: No  . Sexual activity: Yes  Other Topics Concern  . Not on file  Social History Narrative  . Not on file   Social Determinants  of Health   Financial Resource Strain:   . Difficulty of Paying Living Expenses:   Food Insecurity:   . Worried About Charity fundraiser in the Last Year:   . Arboriculturist in the Last Year:   Transportation Needs:   . Film/video editor (Medical):   Marland Kitchen Lack of Transportation (Non-Medical):   Physical Activity:   . Days of Exercise per Week:   . Minutes of Exercise per Session:   Stress:   . Feeling of Stress :   Social Connections:   . Frequency of Communication with Friends and Family:   . Frequency of Social Gatherings with Friends and Family:   . Attends Religious Services:   . Active Member of Clubs or Organizations:   . Attends Archivist Meetings:   Marland Kitchen Marital Status:   Intimate Partner Violence:   . Fear of Current or Ex-Partner:   . Emotionally Abused:   Marland Kitchen Physically Abused:   . Sexually Abused:     Family History  Problem Relation Age of Onset  . Heart disease Mother   . Heart disease Father   . Colon cancer Neg Hx   . Esophageal cancer Neg Hx   . Rectal cancer Neg Hx   . Stomach cancer Neg Hx     Review of Systems:  As stated in the HPI and otherwise negative.   BP 122/70   Pulse 63   Ht 5\' 10"  (1.778 m)   Wt 178 lb (80.7 kg)   SpO2 99%   BMI 25.54 kg/m   Physical Examination: General: Well developed, well nourished, NAD  HEENT: OP clear, mucus membranes moist  SKIN: warm, dry. No rashes. Neuro: No focal deficits  Musculoskeletal: Muscle strength 5/5 all ext   Psychiatric: Mood and affect normal  Neck: No JVD, no carotid bruits, no thyromegaly, no lymphadenopathy.  Lungs:Clear bilaterally, no wheezes, rhonci, crackles Cardiovascular: Regular rate and rhythm. No murmurs, gallops or rubs. Abdomen:Soft. Bowel sounds present. Non-tender.  Extremities: No lower extremity edema. Pulses are 2 + in the bilateral DP/PT.  Cardiac cath 02/28/16:  Prox RCA lesion, 30% stenosed.  Mid RCA to Dist RCA lesion, 40% stenosed.  Prox LAD to Mid LAD lesion, 30% stenosed.  1st Mrg lesion, 99% stenosed. Post intervention, there is a 0% residual stenosis.  The left ventricular systolic function is normal.   1. Severe (99% ) one-vessel coronary artery disease involving proximal OM1 which is very large and supplies most of the left circumflex distribution. 2. Normal LV systolic function and mildly elevated left ventricular end-diastolic pressure. 3. Successful angioplasty and drug-eluting stent placement to OM 1.  Echo 01/15/17: - Left ventricle: The cavity size was normal. Wall thickness was   normal. Systolic function was normal. The estimated ejection   fraction was in the range of 60% to 65%. Wall motion was normal;   there were no regional wall motion abnormalities. Doppler   parameters are consistent with abnormal left ventricular   relaxation (grade 1 diastolic dysfunction). The E/e&' ratio is   between 8-15, suggesting indeterminate LV filling pressure. - Mitral valve: Mildly thickened leaflets . There was trivial   regurgitation. - Left atrium: The atrium was normal in size. - Atrial septum: No defect or patent foramen ovale was identified. - Tricuspid valve: There was mild regurgitation. - Pulmonary arteries: PA peak pressure: 38 mm Hg (S). - Inferior vena cava: The vessel was normal in size. The   respirophasic diameter changes were  in the normal range (>= 50%),   consistent with normal central venous pressure.  Impressions:  - LVEF 60-65%,  normal wall thickness, normal wall motion, grade 1   DD with indeterminate LV filling pressure, normal LA size, upper   normal RA size, trivial MR, mild TR, RVSP 38 mmHg, normal IVC.  EKG:  EKG is not ordered today. The ekg ordered today demonstrates   Recent Labs: 01/11/2020: BUN 16; Creatinine, Ser 0.87; Hemoglobin 12.4; Platelets 292; Potassium 4.1; Sodium 138   Lipid Panel Followed in primary care   Wt Readings from Last 3 Encounters:  01/28/20 178 lb (80.7 kg)  12/30/19 179 lb 3.2 oz (81.3 kg)  12/14/18 180 lb 3.2 oz (81.7 kg)     Other studies Reviewed: Additional studies/ records that were reviewed today include: . Review of the above records demonstrates:   Assessment and Plan:   1. CAD without angina: He had a drug eluting stent placed in the obtuse marginal branch of the Circumflex in May 2017. Normal LV function by echo April 2018. No chest pain. Continue ASA, beta blocker and statin.     2. HLD: Lipids followed in primary care. He is having labs next week in primary care. Continue statin.   3. Atrial fibrillation, paroxysmal: He appears to be in sinus today. Will continue Xarelto and beta blocker.    Current medicines are reviewed at length with the patient today.  The patient does not have concerns regarding medicines.  The following changes have been made:  no change  Labs/ tests ordered today include:   No orders of the defined types were placed in this encounter.   Disposition:   FU with me in 12 months  Signed, Verne Carrow, MD 01/28/2020 9:55 AM    [facility] [address] [place], [place], [place]  [address] Phone: [phone]; Fax: [phone]

## 2020-01-28 ENCOUNTER — Encounter: Payer: Self-pay | Admitting: Cardiovascular Disease

## 2020-01-28 ENCOUNTER — Ambulatory Visit (INDEPENDENT_AMBULATORY_CARE_PROVIDER_SITE_OTHER): Payer: Medicare Other | Admitting: Cardiovascular Disease

## 2020-01-28 ENCOUNTER — Other Ambulatory Visit: Payer: Self-pay

## 2020-01-28 VITALS — BP 122/70 | HR 63 | Ht 70.0 in | Wt 178.0 lb

## 2020-01-28 DIAGNOSIS — E7800 Pure hypercholesterolemia, unspecified: Secondary | ICD-10-CM

## 2020-01-28 DIAGNOSIS — I480 Paroxysmal atrial fibrillation: Secondary | ICD-10-CM | POA: Diagnosis not present

## 2020-01-28 DIAGNOSIS — I2510 Atherosclerotic heart disease of native coronary artery without angina pectoris: Secondary | ICD-10-CM

## 2020-01-28 MED ORDER — METOPROLOL TARTRATE 25 MG PO TABS: 25.0000 mg | ORAL_TABLET | Freq: Two times a day (BID) | ORAL | 3 refills | Status: DC

## 2020-01-28 NOTE — Patient Instructions (Signed)

## 2020-02-09 ENCOUNTER — Ambulatory Visit (INDEPENDENT_AMBULATORY_CARE_PROVIDER_SITE_OTHER): Payer: Medicare Other | Admitting: Emergency Medicine

## 2020-02-09 ENCOUNTER — Other Ambulatory Visit: Payer: Self-pay

## 2020-02-09 ENCOUNTER — Encounter: Payer: Self-pay | Admitting: Emergency Medicine

## 2020-02-09 DIAGNOSIS — R918 Other nonspecific abnormal finding of lung field: Secondary | ICD-10-CM | POA: Diagnosis not present

## 2020-02-09 DIAGNOSIS — R042 Hemoptysis: Secondary | ICD-10-CM

## 2020-02-09 DIAGNOSIS — I2510 Atherosclerotic heart disease of native coronary artery without angina pectoris: Secondary | ICD-10-CM

## 2020-02-09 NOTE — Assessment & Plan Note (Signed)
Migratory, waxing and waning micronodular disease with some groundglass component.  Bronchoscopy was reassuring so far, AFB culture results have not yet been finalized.  Quantified on gold is negative.  Plan for surveillance, repeat CT scan of the chest in August to follow the nodules.

## 2020-02-09 NOTE — Assessment & Plan Note (Addendum)
Resolved.  No evidence of endobronchial lesion on bronchoscopy.  He is back on Xarelto

## 2020-02-09 NOTE — Progress Notes (Signed)
Subjective:    Patient ID: Travis Jacobs, male    DOB: Feb 24, 1951, 69 y.o.   MRN: [id number]  HPI 69 year old former smoker (20 pack years) with a history of CAD/PTCI, GERD with hiatal hernia and esophageal strictures (dilated Dr. Marina Goodell), IBS, paroxysmal atrial fibrillation on Xarelto.  He is referred today for evaluation hemoptysis and pulmonary nodular disease on CT scan of the chest.  He underwent CT chest 02/09/2019, again 08/11/2019 and then most recently 12/06/2019.  I have reviewed all of these.  The original scan identified some apical scarring 9 mm right upper lobe density associated with linear scar.  This area was actually less prominent on scan 08/11/2019, but a new 8 mm nodule was noted in the superior segment of the right lower lobe.  Finally the scan from 12/06/2019 shows stable right upper lobe nodule associated with scar, resolution of 1 right lower lobe nodule but appearance of 6.5 mm hazy groundglass opacity, new 8 mm right lower lobe nodule with some surrounding tree-in-bud changes.  First saw scant blood w mucous in 01/2019, wasn't associated with any other symptoms. He has had two other episodes, both without other sx.  He describes some epistaxis occasionally, but didn't necessarily correlate with the blood. Otherwise minimal cough. Denies fevers or constitutional sx. Has had sinusitis remotely but not recently and not associated with these episodes.   ROV 02/09/2020  -- 69 year old former smoker who follows up today for his pulmonary nodular disease, scant hemoptysis.  Serial CT scans of the chest have identified some waxing and waning tree-in-bud nodular disease.  He underwent bronchoscopy 01/11/2020 to evaluate for possible opportunistic infection, mycobacterial disease.  No blood was seen.  No endobronchial lesions.  Cultures performed. QuantiFERON gold 12/30/2019 negative  AFB smear 01/11/2020 negative, final culture data pending Fungal culture 01/11/2020  negative MDM: Cardiology note 01/28/2020 He is still having some cough, no hemoptysis. He is on fexofenadine and is having some daily allergies.   Review of Systems  Constitutional: Negative for fever and unexpected weight change.  HENT: Positive for congestion. Negative for dental problem, ear pain, nosebleeds, postnasal drip, rhinorrhea, sinus pressure, sneezing, sore throat and trouble swallowing.   Eyes: Positive for itching. Negative for redness.  Respiratory: Positive for cough and shortness of breath. Negative for chest tightness and wheezing.   Cardiovascular: Positive for palpitations. Negative for leg swelling.  Gastrointestinal: Negative for nausea and vomiting.  Genitourinary: Negative for dysuria.  Musculoskeletal: Negative for joint swelling.  Skin: Negative for rash.  Allergic/Immunologic: Positive for environmental allergies and food allergies. Negative for immunocompromised state.  Neurological: Negative for headaches.  Hematological: Does not bruise/bleed easily.  Psychiatric/Behavioral: Negative for dysphoric mood. The patient is not nervous/anxious.     Past Medical History:  Diagnosis Date  . A-fib (HCC)   . CAD (coronary artery disease)    a. DES to OM1 02/2016.   . Cataract    bil cateracts  . Diverticulosis   . Esophageal stricture   . GERD (gastroesophageal reflux disease)   . Hemorrhoids   . Hiatal hernia   . Hyperlipidemia   . IBS (irritable bowel syndrome)   . Multiple food allergies    "lots of food allergies; beef, tuna, flounder, potatoes, garlic, tomatoes, apples, bananas, sesame seeds, shellfish  . Pre-diabetes   . PVC's (premature ventricular contractions)      Family History  Problem Relation Age of Onset  . Heart disease Mother   . Heart disease Father   . Colon  cancer Neg Hx   . Esophageal cancer Neg Hx   . Rectal cancer Neg Hx   . Stomach cancer Neg Hx      Social History   Socioeconomic History  . Marital status: Married     Spouse name: Not on file  . Number of children: Not on file  . Years of education: Not on file  . Highest education level: Not on file  Occupational History  . Not on file  Tobacco Use  . Smoking status: Former Smoker    Packs/day: 1.00    Years: 20.00    Pack years: 20.00    Types: Cigarettes    Quit date: 10/14/1988    Years since quitting: 31.3  . Smokeless tobacco: Never Used  . Tobacco comment: "quit smoking cigarettes in the 1980s"  Substance and Sexual Activity  . Alcohol use: Yes    Alcohol/week: 0.0 standard drinks    Comment: 02/27/2016 "nothing in the last few weeks; no regular pattern"  . Drug use: No  . Sexual activity: Yes  Other Topics Concern  . Not on file  Social History Narrative  . Not on file   Social Determinants of Health   Financial Resource Strain:   . Difficulty of Paying Living Expenses:   Food Insecurity:   . Worried About Programme researcher, broadcasting/film/video in the Last Year:   . Barista in the Last Year:   Transportation Needs:   . Freight forwarder (Medical):   Marland Kitchen Lack of Transportation (Non-Medical):   Physical Activity:   . Days of Exercise per Week:   . Minutes of Exercise per Session:   Stress:   . Feeling of Stress :   Social Connections:   . Frequency of Communication with Friends and Family:   . Frequency of Social Gatherings with Friends and Family:   . Attends Religious Services:   . Active Member of Clubs or Organizations:   . Attends Banker Meetings:   Marland Kitchen Marital Status:   Intimate Partner Violence:   . Fear of Current or Ex-Partner:   . Emotionally Abused:   Marland Kitchen Physically Abused:   . 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  Possible chemical exposures, worked in [employer] Has travelled the world but has not lived anywhere for an extended time.   Allergies  Allergen Reactions  . Iodinated Diagnostic Agents Swelling  . Sulfa Antibiotics Swelling     Mouth swelling  . Aleve [Naproxen Sodium]     Doesn't remember  . Apple Swelling  . Banana Nausea And Vomiting  . Beef-Derived Products Nausea And Vomiting  . Erythromycin Swelling    Mouth swells  . Fish Allergy Swelling  . Garlic Swelling  . Sesame Seed (Diagnostic) Swelling  . Shellfish Allergy Other (See Comments)    Not sure of reaction found out about allergy through [place]  . Sulfonamide Derivatives Swelling    Mouth swelling  . Tomato Nausea And Vomiting    welts  . Iodine Swelling    "I blow up like a balloon"  . Strawberry (Diagnostic) Rash  . Sweet Potato Other (See Comments)    Sweet and Regular Potatoes, GI symptoms     Outpatient Medications Prior to Visit  Medication Sig Dispense Refill  . acetaminophen (TYLENOL) 500 MG tablet Take 500 mg by mouth every 8 (eight) hours as needed for moderate pain or headache.    Marland Kitchen  Apoaequorin (PREVAGEN EXTRA STRENGTH) 20 MG CAPS Take 20 mg by mouth daily.    Marland Kitchen aspirin EC 81 MG tablet Take 1 tablet (81 mg total) by mouth daily. 90 tablet 3  . atorvastatin (LIPITOR) 40 MG tablet Take 1 tablet (40 mg total) by mouth every evening.    . Calcium Polycarbophil (FIBER-CAPS PO) Take 1 capsule by mouth every evening.    Marland Kitchen CIALIS 5 MG tablet Take 5 mg by mouth daily as needed for erectile dysfunction.   98  . docusate sodium (COLACE) 100 MG capsule Take 300 mg by mouth 2 (two) times daily.    Marland Kitchen ELDERBERRY PO Take 1 capsule by mouth daily.    . fexofenadine (ALLEGRA) 180 MG tablet Take 180 mg by mouth daily.    . halobetasol (ULTRAVATE) 0.05 % cream Apply 1 application topically daily as needed (hemorrhoids).     Marland Kitchen ketotifen (ZADITOR) 0.025 % ophthalmic solution Place 1 drop into both eyes 2 (two) times daily.    . Magnesium 250 MG TABS Take 250 mg by mouth in the morning and at bedtime.     . metoprolol tartrate (LOPRESSOR) 25 MG tablet Take 1 tablet (25 mg total) by mouth 2 (two) times daily. 180 tablet 3  . Multiple Vitamin  (MULTIVITAMIN WITH MINERALS) TABS tablet Take 1 tablet by mouth at bedtime.    . nitroGLYCERIN (NITROSTAT) 0.4 MG SL tablet Place 1 tablet (0.4 mg total) under the tongue every 5 (five) minutes x 3 doses as needed for chest pain.    . pantoprazole (PROTONIX) 40 MG tablet Take 1 tablet (40 mg total) by mouth daily.    . Probiotic Product (PROBIOTIC DAILY PO) Take 1 capsule by mouth daily.     . rivaroxaban (XARELTO) 20 MG TABS tablet Take 1 tablet (20 mg total) by mouth every evening. Okay to restart on 01/11/2020.     No facility-administered medications prior to visit.        Objective:   Physical Exam Vitals:   02/09/20 0858  BP: (!) 108/58  Pulse: 67  Temp: 98 F (36.7 C)  TempSrc: Temporal  SpO2: 98%  Weight: 180 lb (81.6 kg)  Height: 5\' 10"  (1.778 m)   Gen: Pleasant, well-nourished, in no distress,  normal affect  ENT: No lesions,  mouth clear,  oropharynx clear, no postnasal drip or blood seen  Neck: No JVD, no stridor  Lungs: No use of accessory muscles, no crackles or wheezing on normal respiration, no wheeze on forced expiration  Cardiovascular: RRR, heart sounds normal, no murmur or gallops, no peripheral edema  Musculoskeletal: No deformities, no cyanosis or clubbing  Neuro: alert, awake, non focal  Skin: Warm, no lesions or rash      Assessment & Plan:  Pulmonary nodules/lesions, multiple Migratory, waxing and waning micronodular disease with some groundglass component.  Bronchoscopy was reassuring so far, AFB culture results have not yet been finalized.  Quantified on gold is negative.  Plan for surveillance, repeat CT scan of the chest in August to follow the nodules.  Hemoptysis Resolved.  No evidence of endobronchial lesion on bronchoscopy.  He is back on Xarelto    Baltazar Apo, MD, PhD 02/09/2020, 9:21 AM [facility] [phone] or if no answer [phone]

## 2020-02-09 NOTE — Patient Instructions (Signed)
Your bronchoscopy culture results are all negative so far.  Your mycobacterial culture should be finalized in the next 2 weeks We will plan to repeat your CT scan of the chest without contrast to follow pulmonary nodular disease in August 2021 Continue fexofenadine (Allegra) once daily Please follow with Dr. Delton Coombes in August after your CT scan to review the results together.  Call to be seen sooner if you have any problems.

## 2020-02-09 NOTE — Addendum Note (Signed)
Addended by: Ahmad Vanwey R on: 02/09/2020 09:47 AM   Modules accepted: Orders  

## 2020-02-10 LAB — FUNGUS CULTURE WITH STAIN

## 2020-02-10 LAB — FUNGAL ORGANISM REFLEX

## 2020-02-10 LAB — FUNGUS CULTURE RESULT

## 2020-02-16 ENCOUNTER — Telehealth (HOSPITAL_COMMUNITY): Payer: Self-pay | Admitting: Internal Medicine

## 2020-02-18 ENCOUNTER — Encounter: Payer: Self-pay | Admitting: Internal Medicine

## 2020-02-18 ENCOUNTER — Telehealth (HOSPITAL_COMMUNITY): Payer: Self-pay | Admitting: *Deleted

## 2020-02-24 LAB — ACID FAST CULTURE WITH REFLEXED SENSITIVITIES (MYCOBACTERIA): Acid Fast Culture: NEGATIVE

## 2020-03-01 ENCOUNTER — Telehealth (HOSPITAL_COMMUNITY): Payer: Self-pay | Admitting: *Deleted

## 2020-03-22 ENCOUNTER — Encounter: Payer: Self-pay | Admitting: Internal Medicine

## 2020-03-22 ENCOUNTER — Ambulatory Visit (INDEPENDENT_AMBULATORY_CARE_PROVIDER_SITE_OTHER): Payer: Medicare Other | Admitting: Internal Medicine

## 2020-03-22 VITALS — BP 128/77 | HR 55 | Ht 70.0 in | Wt 176.6 lb

## 2020-03-22 DIAGNOSIS — K648 Other hemorrhoids: Secondary | ICD-10-CM

## 2020-03-22 DIAGNOSIS — K222 Esophageal obstruction: Secondary | ICD-10-CM | POA: Diagnosis not present

## 2020-03-22 DIAGNOSIS — K625 Hemorrhage of anus and rectum: Secondary | ICD-10-CM

## 2020-03-22 DIAGNOSIS — I2510 Atherosclerotic heart disease of native coronary artery without angina pectoris: Secondary | ICD-10-CM | POA: Diagnosis not present

## 2020-03-22 DIAGNOSIS — Z7901 Long term (current) use of anticoagulants: Secondary | ICD-10-CM

## 2020-03-22 DIAGNOSIS — K2100 Gastro-esophageal reflux disease with esophagitis, without bleeding: Secondary | ICD-10-CM | POA: Diagnosis not present

## 2020-03-22 NOTE — Progress Notes (Signed)
HISTORY OF PRESENT ILLNESS:  Travis Jacobs is a 69 y.o. male with coronary artery disease and atrial fibrillation on Xarelto he presents today with chief complaint of symptomatic hemorrhoids.  He wonders about an office banding procedure.  Patient is followed in this office for GERD complicated by peptic stricture which has required esophageal dilation as well as routine colon cancer screening.  He was last seen April 11, 2015 when he underwent screening colonoscopy therapeutic upper endoscopy.  Colonoscopy revealed mild diverticulosis sigmoid colon.  As well he was found to have grade 4 prolapsed hemorrhoids.  He was initiated on Metamucil, prescribed Anusol, and referred to general surgery for his symptomatic hemorrhoids.  Patient tells me that he saw Dr. Rosendo Gros has who performed 1 session of an office banding.  This was not effective.  He tells me that he was referred to colorectal surgeon Dr. Marcello Moores who advised medical therapy.  He has continued to have problems with itching and raw feeling hemorrhoids with intermittent significant bleeding.  Bleeding has been worse since he has been on Xarelto for the past few years.  He has tried a number of topical agents with variable relief, including halobetasol.  In terms of GERD, symptoms are controlled currently with pantoprazole 40 daily.  He does have occasional dysphagia to items such as sponge cake or biscuits.  Not as severe as previous when he underwent esophageal dilation to 83 [place] in June 2016.  Noted to have esophagitis at that time.  GI review of systems is negative.  Review of outside blood work from January 11, 2020 finds unremarkable comprehensive metabolic panel.  CBC with mild anemia, hemoglobin 12.4.  Previously 14.19 September 2019.   REVIEW OF SYSTEMS:  All non-GI ROS negative unless otherwise stated in the HPI except for allergies  Past Medical History:  Diagnosis Date  . A-fib ([place])   . CAD (coronary artery disease)    a. DES to OM1  02/2016.   . Cataract    bil cateracts  . Diverticulosis   . Esophageal stricture   . GERD (gastroesophageal reflux disease)   . Hemorrhoids   . Hiatal hernia   . Hyperlipidemia   . IBS (irritable bowel syndrome)   . Multiple food allergies    "lots of food allergies; beef, tuna, flounder, potatoes, garlic, tomatoes, apples, bananas, sesame seeds, shellfish  . Pre-diabetes   . PVC's (premature ventricular contractions)     Past Surgical History:  Procedure Laterality Date  . BRONCHIAL WASHINGS  01/11/2020   Procedure: BRONCHIAL WASHINGS;  Surgeon: Collene Gobble, MD;  Location[facility] ENDOSCOPY;  Service: Cardiopulmonary;;  . CARDIAC CATHETERIZATION N/A 02/28/2016   Procedure: Left Heart Cath and Coronary Angiography;  Surgeon: Wellington Hampshire, MD;  Location: [place] CV LAB;  Service: Cardiovascular;  Laterality: N/A;  . CARDIAC CATHETERIZATION N/A 02/28/2016   Procedure: Coronary Stent Intervention;  Surgeon: Wellington Hampshire, MD;  Location: [place] [facility] LAB;  Service: Cardiovascular;  Laterality: N/A;  . COLONOSCOPY    . CYSTOSCOPY WITH STENT PLACEMENT    . ESOPHAGOGASTRODUODENOSCOPY (EGD) WITH ESOPHAGEAL DILATION    . HEMORRHOID BANDING    . HERNIA REPAIR    . SURGERY SCROTAL / TESTICULAR Left ~ 1960   "hernia"  . TONSILLECTOMY    . UPPER GASTROINTESTINAL ENDOSCOPY    . VIDEO BRONCHOSCOPY N/A 01/11/2020   Procedure: VIDEO BRONCHOSCOPY WITHOUT FLUORO;  Surgeon: Collene Gobble, MD;  Location: [facility] ENDOSCOPY;  Service: Cardiopulmonary;  Laterality: N/A;  Social History Travis Jacobs  reports that he quit smoking about 31 years ago. His smoking use included cigarettes. He has a 20.00 pack-year smoking history. He has never used smokeless tobacco. He reports current alcohol use. He reports that he does not use drugs.  family history includes Heart disease in his father and mother.  Allergies  Allergen Reactions  . Iodinated Diagnostic Agents Swelling  . Sulfa  Antibiotics Swelling    Mouth swelling  . Aleve [Naproxen Sodium]     Doesn't remember  . Apple Swelling  . Banana Nausea And Vomiting  . Beef-Derived Products Nausea And Vomiting  . Erythromycin Swelling    Mouth swells  . Fish Allergy Swelling  . Garlic Swelling  . Sesame Seed (Diagnostic) Swelling  . Shellfish Allergy Other (See Comments)    Not sure of reaction found out about allergy through [facility]  . Sulfonamide Derivatives Swelling    Mouth swelling  . Tomato Nausea And Vomiting    welts  . Iodine Swelling    "I blow up like a balloon"  . Strawberry (Diagnostic) Rash  . Sweet Potato Other (See Comments)    Sweet and Regular Potatoes, GI symptoms       PHYSICAL EXAMINATION: Vital signs: BP 128/77   Pulse (!) 55   Ht 5\' 10"  (1.778 m)   Wt 176 lb 9.6 oz (80.1 kg)   SpO2 97%   BMI 25.34 kg/m   Constitutional: generally well-appearing, no acute distress Psychiatric: alert and oriented x3, cooperative Eyes: extraocular movements intact, anicteric, conjunctiva pink Mouth: oral pharynx moist, no lesions Neck: supple no lymphadenopathy Cardiovascular: heart regular rate and rhythm, no murmur Lungs: clear to auscultation bilaterally Abdomen: soft, nontender, nondistended, no obvious ascites, no peritoneal signs, normal bowel sounds, no organomegaly Rectal: Multiple large prolapsed purpleish hemorrhoids without active bleeding Extremities: no clubbing, cyanosis, or lower extremity edema bilaterally Skin: no lesions on visible extremities Neuro: No focal deficits.  Cranial nerves intact  ASSESSMENT:  1.  Grade 4 (nonreducible prolapsed large) internal hemorrhoids with discomfort and intermittent bleeding.  Significant bleeding at times on Xarelto.  No bleeding recently.  Drifting hemoglobin likely related to the same, as noted above. 2.  Colonoscopy 2016 with mild diverticulosis and grade 4 internal hemorrhoids.  No neoplasia 3.  GERD complicated by esophagitis and  peptic stricture.  Classic reflux symptoms controlled with PPI.  Some mild dysphagia, though he does not feel repeat dilation is warranted at this time 4.  History of coronary artery disease and atrial fibrillation on Xarelto   PLAN:  1.  The patient's hemorrhoids are not appropriate for an office banding given their grade.  Believe he would benefit most from traditional surgical hemorrhoidectomy.  Would like a different opinion in this regard.  As such, I will refer him to colorectal specialist Dr. 2017 at [facility].  He is agreeable. 2.  Continue with symptomatic therapies in the interim 3.  Reflux precautions 4.  Continue PPI 5.  Contact this office should dysphagia worsen.  I warned him against the prospects of possible food impaction.  He understands 6.  Ongoing general medical care with PCP and other specialists 7.  Due for routine screening colonoscopy around June 2026.

## 2020-03-22 NOTE — Patient Instructions (Signed)
I will call you after I have contacted Dr. Alpha Gula office

## 2020-03-24 ENCOUNTER — Telehealth: Payer: Self-pay

## 2020-03-24 NOTE — Addendum Note (Signed)
Addended by: Alonna Buckler K on: 03/24/2020 10:02 AM   Modules accepted: Orders

## 2020-03-24 NOTE — Telephone Encounter (Signed)
Faxed referral to Dr. Drue Dun; they will call patient to schedule appointment

## 2020-03-28 ENCOUNTER — Other Ambulatory Visit: Payer: Self-pay | Admitting: Cardiovascular Disease

## 2020-03-28 NOTE — Telephone Encounter (Signed)
Prescription refill request for Xarelto received.   Last office visit: [place], 01/28/2020 Weight: 80.1 kg Age: 68 y.o. Scr: 0.87, 01/11/2020 CrCl: 89 ml/min   Prescription refill sent.

## 2020-05-03 ENCOUNTER — Telehealth: Payer: Self-pay | Admitting: Internal Medicine

## 2020-05-03 NOTE — Telephone Encounter (Signed)
[facility] office called in reference to the referral that we sent. They still need Colon and EGD report and associated path results faxed to [phone].

## 2020-05-04 NOTE — Telephone Encounter (Signed)
Faxed most recent endoscopy and colonoscopy - no path associated with either.

## 2020-06-07 ENCOUNTER — Other Ambulatory Visit: Payer: Self-pay

## 2020-06-07 ENCOUNTER — Ambulatory Visit
Admission: RE | Admit: 2020-06-07 | Discharge: 2020-06-07 | Disposition: A | Discharge status: Home / Self Care | Payer: Medicare Other | Source: Ambulatory Visit | Attending: Emergency Medicine | Admitting: Emergency Medicine

## 2020-06-07 DIAGNOSIS — R918 Other nonspecific abnormal finding of lung field: Secondary | ICD-10-CM

## 2020-07-03 ENCOUNTER — Encounter: Payer: Self-pay | Admitting: Emergency Medicine

## 2020-07-03 ENCOUNTER — Other Ambulatory Visit: Payer: Self-pay

## 2020-07-03 ENCOUNTER — Ambulatory Visit (INDEPENDENT_AMBULATORY_CARE_PROVIDER_SITE_OTHER): Payer: Medicare Other | Admitting: Emergency Medicine

## 2020-07-03 DIAGNOSIS — R0600 Dyspnea, unspecified: Secondary | ICD-10-CM | POA: Insufficient documentation

## 2020-07-03 DIAGNOSIS — R911 Solitary pulmonary nodule: Secondary | ICD-10-CM

## 2020-07-03 DIAGNOSIS — I2510 Atherosclerotic heart disease of native coronary artery without angina pectoris: Secondary | ICD-10-CM | POA: Diagnosis not present

## 2020-07-03 DIAGNOSIS — R918 Other nonspecific abnormal finding of lung field: Secondary | ICD-10-CM | POA: Diagnosis not present

## 2020-07-03 DIAGNOSIS — R0602 Shortness of breath: Secondary | ICD-10-CM

## 2020-07-03 NOTE — Addendum Note (Signed)
Addended by: Dorisann Frames R on: 07/03/2020 09:55 AM   Modules accepted: Orders

## 2020-07-03 NOTE — Patient Instructions (Addendum)
We will plan to repeat your CT scan of the chest without contrast in August 2022 Please call if you would like to investigate your shortness of breath further, consider pulmonary function testing.  If so then we will arrange Otherwise follow-up with Dr. Delton Coombes in August 2022 to review your CT scan together, sooner if you have any new problems.

## 2020-07-03 NOTE — Assessment & Plan Note (Signed)
Waxing and waning pulmonary nodular disease.  Some of his micronodular disease is resolved on the most recent CT, he has a new peripheral 4 mm right lower lobe nodular area, persistent inflammatory change.  AFB, fungal cultures are negative, cytology negative bronchoscopy 12/2019.  We agreed to repeat his CT scan of the chest in 1 year to follow-up for interval stability.

## 2020-07-03 NOTE — Assessment & Plan Note (Addendum)
Longstanding, symptoms associated with supine position, not usually associated with exertion.  Suspect restriction although he was a smoker.  Offered pulmonary function testing and further investigation if he would like to do so.  Has is going to think about this and get back to me.

## 2020-07-03 NOTE — Progress Notes (Signed)
Subjective:    Patient ID: Travis Jacobs, male    DOB: 1950/12/04, 69 y.o.   MRN: [id number]  HPI 69 year old former smoker (20 pack years) with a history of CAD/PTCI, GERD with hiatal hernia and esophageal strictures (dilated Dr. Marina Goodell), IBS, paroxysmal atrial fibrillation on Xarelto.  He is referred today for evaluation hemoptysis and pulmonary nodular disease on CT scan of the chest.  He underwent CT chest 02/09/2019, again 08/11/2019 and then most recently 12/06/2019.  I have reviewed all of these.  The original scan identified some apical scarring 9 mm right upper lobe density associated with linear scar.  This area was actually less prominent on scan 08/11/2019, but a new 8 mm nodule was noted in the superior segment of the right lower lobe.  Finally the scan from 12/06/2019 shows stable right upper lobe nodule associated with scar, resolution of 1 right lower lobe nodule but appearance of 6.5 mm hazy groundglass opacity, new 8 mm right lower lobe nodule with some surrounding tree-in-bud changes.  First saw scant blood w mucous in 01/2019, wasn't associated with any other symptoms. He has had two other episodes, both without other sx.  He describes some epistaxis occasionally, but didn't necessarily correlate with the blood. Otherwise minimal cough. Denies fevers or constitutional sx. Has had sinusitis remotely but not recently and not associated with these episodes.   ROV 02/09/2020  -- 69 year old former smoker who follows up today for his pulmonary nodular disease, scant hemoptysis.  Serial CT scans of the chest have identified some waxing and waning tree-in-bud nodular disease.  He underwent bronchoscopy 01/11/2020 to evaluate for possible opportunistic infection, mycobacterial disease.  No blood was seen.  No endobronchial lesions.  Cultures performed. QuantiFERON gold 12/30/2019 negative  AFB smear 01/11/2020 negative, final culture data pending Fungal culture 01/11/2020  negative MDM: Cardiology note 01/28/2020 He is still having some cough, no hemoptysis. He is on fexofenadine and is having some daily allergies.   ROV 07/03/20 --this is a follow-up visit for 69 year old former smoker with a history of waxing waning tree-in-bud micronodular disease.  He had bronchoscopy in 12/2019 with a negative fungal, AFB, cytology.  Repeat CT scan of chest on 06/07/2020 reviewed by me, shows waxing and waning micronodular changes: Resolution of his right lower lobe nodules, stable 4 mm right upper lobe nodule, new observation of a 4 mm lateral right lower lobe nodule, bilateral parenchymal scarring.  He has some cough daily, some congestion after eating. No more hemoptysis. He has longstanding SOB that he notices more when he is sedentary, when he lays flat. Seems to be able to exert ok. Minimal cough  Review of Systems  Constitutional: Negative for fever and unexpected weight change.  HENT: Positive for congestion. Negative for dental problem, ear pain, nosebleeds, postnasal drip, rhinorrhea, sinus pressure, sneezing, sore throat and trouble swallowing.   Eyes: Positive for itching. Negative for redness.  Respiratory: Positive for cough and shortness of breath. Negative for chest tightness and wheezing.   Cardiovascular: Positive for palpitations. Negative for leg swelling.  Gastrointestinal: Negative for nausea and vomiting.  Genitourinary: Negative for dysuria.  Musculoskeletal: Negative for joint swelling.  Skin: Negative for rash.  Allergic/Immunologic: Positive for environmental allergies and food allergies. Negative for immunocompromised state.  Neurological: Negative for headaches.  Hematological: Does not bruise/bleed easily.  Psychiatric/Behavioral: Negative for dysphoric mood. The patient is not nervous/anxious.     Past Medical History:  Diagnosis Date   A-fib [facility])    CAD (coronary  artery disease)    a. DES to OM1 02/2016.    Cataract    bil cateracts    Diverticulosis    Esophageal stricture    GERD (gastroesophageal reflux disease)    Hemorrhoids    Hiatal hernia    Hyperlipidemia    IBS (irritable bowel syndrome)    Multiple food allergies    "lots of food allergies; beef, tuna, flounder, potatoes, garlic, tomatoes, apples, bananas, sesame seeds, shellfish   Pre-diabetes    PVC's (premature ventricular contractions)      Family History  Problem Relation Age of Onset   Heart disease Mother    Heart disease Father    Colon cancer Neg Hx    Esophageal cancer Neg Hx    Rectal cancer Neg Hx    Stomach cancer Neg Hx      Social History   Socioeconomic History   Marital status: Married    Spouse name: Not on file   Number of children: Not on file   Years of education: Not on file   Highest education level: Not on file  Occupational History   Not on file  Tobacco Use   Smoking status: Former Smoker    Packs/day: 1.00    Years: 20.00    Pack years: 20.00    Types: Cigarettes    Quit date: 10/14/1988    Years since quitting: 31.7   Smokeless tobacco: Never Used   Tobacco comment: "quit smoking cigarettes in the 1980s"  Vaping Use   Vaping Use: Never used  Substance and Sexual Activity   Alcohol use: Yes    Alcohol/week: 0.0 standard drinks    Comment: 02/27/2016 "nothing in the last few weeks; no regular pattern"   Drug use: No   Sexual activity: Yes  Other Topics Concern   Not on file  Social History Narrative   Not on file   Social Determinants of Health   Financial Resource Strain:    Difficulty of Paying Living Expenses: Not on file  Food Insecurity:    Worried About Running Out of Food in the Last Year: Not on file   Ran Out of Food in the Last Year: Not on file  Transportation Needs:    Lack of Transportation (Medical): Not on file   Lack of Transportation (Non-Medical): Not on file  Physical Activity:    Days of Exercise per Week: Not on file   Minutes of Exercise  per Session: Not on file  Stress:    Feeling of Stress : Not on file  Social Connections:    Frequency of Communication with Friends and Family: Not on file   Frequency of Social Gatherings with Friends and Family: Not on file   Attends Religious Services: Not on file   Active Member of Clubs or Organizations: Not on file   Attends Banker Meetings: Not on file   Marital Status: Not on file  Intimate Partner Violence:    Fear of Current or Ex-Partner: Not on file   Emotionally Abused: Not on file   Physically Abused: Not on file   Sexually Abused: Not on file    Remotely worked Haematologist / brake work, 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  Possible chemical exposures, worked in [employer] Has travelled the world but has not lived anywhere for an extended time.   Allergies  Allergen Reactions   Iodinated Diagnostic Agents Swelling   Sulfa Antibiotics Swelling    Mouth swelling   Aleve [Naproxen Sodium]  Doesn't remember   Apple Swelling   Banana Nausea And Vomiting   Beef-Derived Products Nausea And Vomiting   Erythromycin Swelling    Mouth swells   Fish Allergy Swelling   Garlic Swelling   Sesame Seed (Diagnostic) Swelling   Shellfish Allergy Other (See Comments)    Not sure of reaction found out about allergy through [facility]   Sulfonamide Derivatives Swelling    Mouth swelling   Tomato Nausea And Vomiting    welts   Iodine Swelling    "I blow up like a balloon"   Strawberry (Diagnostic) Rash   Sweet Potato Other (See Comments)    Sweet and Regular Potatoes, GI symptoms     Outpatient Medications Prior to Visit  Medication Sig Dispense Refill   acetaminophen (TYLENOL) 500 MG tablet Take 500 mg by mouth every 8 (eight) hours as needed for moderate pain or headache.     Apoaequorin (PREVAGEN EXTRA STRENGTH) 20 MG CAPS Take 20 mg by mouth daily.     aspirin EC 81 MG tablet Take 1 tablet (81 mg total) by mouth daily. 90  tablet 3   atorvastatin (LIPITOR) 40 MG tablet Take 1 tablet (40 mg total) by mouth every evening.     Calcium Polycarbophil (FIBER-CAPS PO) Take 1 capsule by mouth every evening.     CIALIS 5 MG tablet Take 5 mg by mouth daily as needed for erectile dysfunction.   98   docusate sodium (COLACE) 100 MG capsule Take 300 mg by mouth 2 (two) times daily.     ELDERBERRY PO Take 1 capsule by mouth daily.     fexofenadine (ALLEGRA) 180 MG tablet Take 180 mg by mouth daily.     halobetasol (ULTRAVATE) 0.05 % cream Apply 1 application topically daily as needed (hemorrhoids).      ketotifen (ZADITOR) 0.025 % ophthalmic solution Place 1 drop into both eyes 2 (two) times daily.     Magnesium 250 MG TABS Take 250 mg by mouth in the morning and at bedtime.      metoprolol tartrate (LOPRESSOR) 25 MG tablet Take 1 tablet (25 mg total) by mouth 2 (two) times daily. 180 tablet 3   Multiple Vitamin (MULTIVITAMIN WITH MINERALS) TABS tablet Take 1 tablet by mouth at bedtime.     nitroGLYCERIN (NITROSTAT) 0.4 MG SL tablet Place 1 tablet (0.4 mg total) under the tongue every 5 (five) minutes x 3 doses as needed for chest pain.     pantoprazole (PROTONIX) 40 MG tablet Take 1 tablet (40 mg total) by mouth daily.     Probiotic Product (PROBIOTIC DAILY PO) Take 1 capsule by mouth daily.      XARELTO 20 MG TABS tablet TAKE 1 TABLET BY MOUTH EVERY DAY WITH SUPPER 90 tablet 1   No facility-administered medications prior to visit.        Objective:   Physical Exam Vitals:   07/03/20 0853  BP: 130/74  Pulse: 60  Temp: 97.7 F (36.5 C)  TempSrc: Temporal  SpO2: 99%  Weight: 182 lb (82.6 kg)  Height: 5\' 10"  (1.778 m)   Gen: Pleasant, well-nourished, in no distress,  normal affect  ENT: No lesions,  mouth clear,  oropharynx clear, no postnasal drip or blood seen  Neck: No JVD, no stridor  Lungs: No use of accessory muscles, no crackles or wheezing on normal respiration, no wheeze on forced  expiration  Cardiovascular: RRR, heart sounds normal, no murmur or gallops, no peripheral edema  Musculoskeletal: No  deformities, no cyanosis or clubbing  Neuro: alert, awake, non focal  Skin: Warm, no lesions or rash      Assessment & Plan:  Pulmonary nodules/lesions, multiple Waxing and waning pulmonary nodular disease.  Some of his micronodular disease is resolved on the most recent CT, he has a new peripheral 4 mm right lower lobe nodular area, persistent inflammatory change.  AFB, fungal cultures are negative, cytology negative bronchoscopy 12/2019.  We agreed to repeat his CT scan of the chest in 1 year to follow-up for interval stability.  Dyspnea Longstanding, symptoms associated with supine position, not usually associated with exertion.  Suspect restriction although he was a smoker.  Offered pulmonary function testing and further investigation if he would like to do so.  Has is going to think about this and get back to me.    Levy Pupa, MD, PhD 07/03/2020, 9:28 AM [facility] [phone] or if no answer [phone]

## 2020-07-21 ENCOUNTER — Telehealth: Payer: Self-pay | Admitting: *Deleted

## 2020-07-21 ENCOUNTER — Encounter (HOSPITAL_COMMUNITY): Payer: Self-pay

## 2020-07-21 ENCOUNTER — Emergency Department (HOSPITAL_COMMUNITY)
Admission: EM | Admit: 2020-07-21 | Discharge: 2020-07-22 | Disposition: A | Discharge status: Home / Self Care | Payer: Medicare Other | Attending: Emergency Medicine | Admitting: Emergency Medicine

## 2020-07-21 DIAGNOSIS — R042 Hemoptysis: Secondary | ICD-10-CM | POA: Diagnosis not present

## 2020-07-21 DIAGNOSIS — Z7982 Long term (current) use of aspirin: Secondary | ICD-10-CM | POA: Diagnosis not present

## 2020-07-21 DIAGNOSIS — I2510 Atherosclerotic heart disease of native coronary artery without angina pectoris: Secondary | ICD-10-CM | POA: Diagnosis not present

## 2020-07-21 DIAGNOSIS — Z87891 Personal history of nicotine dependence: Secondary | ICD-10-CM | POA: Insufficient documentation

## 2020-07-21 DIAGNOSIS — Z79899 Other long term (current) drug therapy: Secondary | ICD-10-CM | POA: Insufficient documentation

## 2020-07-21 DIAGNOSIS — Z7901 Long term (current) use of anticoagulants: Secondary | ICD-10-CM | POA: Insufficient documentation

## 2020-07-21 DIAGNOSIS — R059 Cough, unspecified: Secondary | ICD-10-CM | POA: Diagnosis present

## 2020-07-21 LAB — CBC
HCT: 39.5 % (ref 39.0–52.0)
Hemoglobin: 11.7 g/dL — ABNORMAL LOW (ref 13.0–17.0)
MCH: 23.3 pg — ABNORMAL LOW (ref 26.0–34.0)
MCHC: 29.6 g/dL — ABNORMAL LOW (ref 30.0–36.0)
MCV: 78.7 fL — ABNORMAL LOW (ref 80.0–100.0)
Platelets: 315 10*3/uL (ref 150–400)
RBC: 5.02 MIL/uL (ref 4.22–5.81)
RDW: 15.9 % — ABNORMAL HIGH (ref 11.5–15.5)
WBC: 10.7 10*3/uL — ABNORMAL HIGH (ref 4.0–10.5)
nRBC: 0 % (ref 0.0–0.2)

## 2020-07-21 LAB — BASIC METABOLIC PANEL
Anion gap: 10 (ref 5–15)
BUN: 14 mg/dL (ref 8–23)
CO2: 25 mmol/L (ref 22–32)
Calcium: 9.8 mg/dL (ref 8.9–10.3)
Chloride: 105 mmol/L (ref 98–111)
Creatinine, Ser: 0.9 mg/dL (ref 0.61–1.24)
GFR, Estimated: >60 mL/min (ref >60)
Glucose, Bld: 99 mg/dL (ref 70–99)
Potassium: 4 mmol/L (ref 3.5–5.1)
Sodium: 140 mmol/L (ref 135–145)

## 2020-07-21 NOTE — ED Triage Notes (Signed)
Pt arrives to ED w/ c/o coughing up blood last night. Pt seen in 01/2019 for similar symptoms and has multiple CT scans and bronchoscopy over the past year d/t lung nodules. Pt denies chest pain and sob.

## 2020-07-21 NOTE — Telephone Encounter (Signed)
I have adv patient in separate telephone note to follow the recommendations from his lung doctor's office.  He wants to know what to do as far as what Dr. Clifton James recommends.  He is adv I will forward the message but may not hear back by end of today. See telephone encounter for complete details.

## 2020-07-21 NOTE — Telephone Encounter (Signed)
Called patient in response to MyChart message and photo He was adv by pulmonary to go to ER but does not wish to wait in the ED until midnight on Friday night.  States he was also adv not to take any more Xarelto until he is evaluated and now wants to know what he should do.  He is also on aspirin and wants to know if he should stop this.  I adv that he should follow pulmonary recommendations.  I adv of risk of stopping Xarelto and adv of risk of potential continued bleeding from wherever hemoptysis came from.  Adv that I would forward to Dr. Clifton James to see if he has any different recommendation, but for now my suggestion is to follow what his pulmonary provider told him.

## 2020-07-21 NOTE — Telephone Encounter (Signed)
Called patient d/t nature of email and to consolidate the 3 separate emails that were sent by patient regarding this issue.  Pt states that approx midnight last night, he had a "rumbling" in chest, coughed up a large amount of bright red blood with a clot in it.  (there is a photo in another email sent today on this issue).  Approx 1445 today he coughed up 2 more small dark red blood clots.  Pt also explained that he was experiencing what sounded like expiratory wheezing to me.    Patient sent a photo of the blood clot, it is in pt's chart under mychart message for today.    Pt denies fever, injury to chest, increased SOB, leg pain.  Pt states he considered going to the ED last night for the amount of blood that was coughed up, but wants to get our providers recs first.  Pt had a CT chest 05/2020.    Sending message to [facility] for recs since RB is in the hospital today.  Thanks!

## 2020-07-21 NOTE — Telephone Encounter (Signed)
Recommend holding Xarelto. He needs ED evaluation, it being Friday afternoon not safe to treat this outpatient going into the weekend.

## 2020-07-21 NOTE — Telephone Encounter (Signed)
Spoke with pt, aware of recs.  Nothing further needed at this time- will close encounter.   

## 2020-07-22 ENCOUNTER — Emergency Department (HOSPITAL_COMMUNITY): Payer: Medicare Other

## 2020-07-22 DIAGNOSIS — R042 Hemoptysis: Secondary | ICD-10-CM | POA: Diagnosis not present

## 2020-07-22 LAB — PROTIME-INR
INR: 1.1 (ref 0.8–1.2)
Prothrombin Time: 13.9 seconds (ref 11.4–15.2)

## 2020-07-22 MED ORDER — DIPHENHYDRAMINE HCL 50 MG/ML IJ SOLN
50.0000 mg | Freq: Once | INTRAMUSCULAR | Status: AC
  Administered 2020-07-22: 50 mg via INTRAVENOUS
  Filled 2020-07-22: qty 1

## 2020-07-22 MED ORDER — HYDROCORTISONE NA SUCCINATE PF 250 MG IJ SOLR
200.0000 mg | Freq: Once | INTRAMUSCULAR | Status: AC
  Administered 2020-07-22: 200 mg via INTRAVENOUS
  Filled 2020-07-22: qty 200

## 2020-07-22 MED ORDER — IOHEXOL 350 MG/ML SOLN
75.0000 mL | Freq: Once | INTRAVENOUS | Status: AC | PRN
  Administered 2020-07-22: 75 mL via INTRAVENOUS

## 2020-07-22 MED ORDER — DIPHENHYDRAMINE HCL 25 MG PO CAPS
50.0000 mg | ORAL_CAPSULE | Freq: Once | ORAL | Status: AC
  Filled 2020-07-22: qty 2

## 2020-07-22 NOTE — ED Notes (Signed)
Pt transported to CT ?

## 2020-07-22 NOTE — ED Provider Notes (Signed)
Patient seen after prior ED provider.   Patient is asymptomatic at this time.  He declines admission for observation.  He does have established pulmonary care with Dr. Solon Augusta.  He plans on calling Dr. Gailen Shelter office first and on Monday.  Patient declines prophylactic course of antibiotics for possible, but unlikely, pneumonia.  Patient does understand the inherent risks of returning home.  Patient is without any episodes of recurrent hemoptysis while in the ED.     Wynetta Fines, MD 07/22/20 1029

## 2020-07-22 NOTE — ED Provider Notes (Signed)
[facility] EMERGENCY DEPARTMENT Provider Note   CSN: [id number] Arrival date & time: 07/21/20  1759     History Chief Complaint  Patient presents with  . Hematemesis    Travis Jacobs is a 69 y.o. male.  The history is provided by the patient.      Patient with history of CAD, atrial fibrillation on anticoagulation, history of lung nodules presents with cough and hemoptysis.  Patient reports since yesterday he has had multiple episodes of hemoptysis  No chest pain.  No vomiting.  No blood in his stool.  No increased shortness of breath.  He has never had this before.  No fevers.  No history of PE.  He is managed by pulmonology for his lung nodules. His course is stable. Past Medical History:  Diagnosis Date  . A-fib (HCC)   . CAD (coronary artery disease)    a. DES to OM1 02/2016.   . Cataract    bil cateracts  . Diverticulosis   . Esophageal stricture   . GERD (gastroesophageal reflux disease)   . Hemorrhoids   . Hiatal hernia   . Hyperlipidemia   . IBS (irritable bowel syndrome)   . Multiple food allergies    "lots of food allergies; beef, tuna, flounder, potatoes, garlic, tomatoes, apples, bananas, sesame seeds, shellfish  . Pre-diabetes   . PVC's (premature ventricular contractions)     Patient Active Problem List   Diagnosis Date Noted  . Dyspnea 07/03/2020  . Hemoptysis 12/30/2019  . Pulmonary nodules/lesions, multiple 12/30/2019  . Pre-diabetes   . PVC's (premature ventricular contractions)   . Hyperlipidemia   . CAD- s/p PCI +DES to [facility] 02/28/16 02/29/2016  . Effort angina (HCC)   . Abnormal stress test 02/27/2016  . Ventricular bigeminy 02/27/2016  . INTESTINAL GAS 08/09/2008  . ANXIETY DEPRESSION 08/04/2008  . HEMORRHOIDS, EXTERNAL 08/04/2008  . GERD 08/04/2008  . HIATAL HERNIA 08/04/2008  . DIVERTICULOSIS, COLON 08/04/2008  . IBS 08/04/2008    Past Surgical History:  Procedure Laterality Date  . BRONCHIAL WASHINGS   01/11/2020   Procedure: BRONCHIAL WASHINGS;  Surgeon: Leslye Peer, MD;  Location: [facility] ENDOSCOPY;  Service: Cardiopulmonary;;  . CARDIAC CATHETERIZATION N/A 02/28/2016   Procedure: Left Heart Cath and Coronary Angiography;  Surgeon: Iran Ouch, MD;  Location: [facility] INVASIVE CV LAB;  Service: Cardiovascular;  Laterality: N/A;  . CARDIAC CATHETERIZATION N/A 02/28/2016   Procedure: Coronary Stent Intervention;  Surgeon: Iran Ouch, MD;  Location: [facility] INVASIVE CV LAB;  Service: Cardiovascular;  Laterality: N/A;  . COLONOSCOPY    . CYSTOSCOPY WITH STENT PLACEMENT    . ESOPHAGOGASTRODUODENOSCOPY (EGD) WITH ESOPHAGEAL DILATION    . HEMORRHOID BANDING    . HERNIA REPAIR    . SURGERY SCROTAL / TESTICULAR Left ~ 1960   "hernia"  . TONSILLECTOMY    . UPPER GASTROINTESTINAL ENDOSCOPY    . VIDEO BRONCHOSCOPY N/A 01/11/2020   Procedure: VIDEO BRONCHOSCOPY WITHOUT FLUORO;  Surgeon: Leslye Peer, MD;  Location: [facility] ENDOSCOPY;  Service: Cardiopulmonary;  Laterality: N/A;       Family History  Problem Relation Age of Onset  . Heart disease Mother   . Heart disease Father   . Colon cancer Neg Hx   . Esophageal cancer Neg Hx   . Rectal cancer Neg Hx   . Stomach cancer Neg Hx     Social History   Tobacco Use  . Smoking status: Former Smoker    Packs/day: 1.00  Years: 20.00    Pack years: 20.00    Types: Cigarettes    Quit date: 10/14/1988    Years since quitting: 31.7  . Smokeless tobacco: Never Used  . Tobacco comment: "quit smoking cigarettes in the 1980s"  Vaping Use  . Vaping Use: Never used  Substance Use Topics  . Alcohol use: Yes    Alcohol/week: 0.0 standard drinks    Comment: 02/27/2016 "nothing in the last few weeks; no regular pattern"  . Drug use: No    Home Medications Prior to Admission medications   Medication Sig Start Date End Date Taking? Authorizing Provider  acetaminophen (TYLENOL) 500 MG tablet Take 500 mg by mouth every 8 (eight) hours as needed for  moderate pain or headache.    [provider]  Apoaequorin (PREVAGEN EXTRA STRENGTH) 20 MG CAPS Take 20 mg by mouth daily.    [provider]  aspirin EC 81 MG tablet Take 1 tablet (81 mg total) by mouth daily. 10/23/17   Kathleene Hazel, MD  atorvastatin (LIPITOR) 40 MG tablet Take 1 tablet (40 mg total) by mouth every evening. 01/11/20   Leslye Peer, MD  Calcium Polycarbophil (FIBER-CAPS PO) Take 1 capsule by mouth every evening.    [provider]  CIALIS 5 MG tablet Take 5 mg by mouth daily as needed for erectile dysfunction.  02/27/15   [provider]  docusate sodium (COLACE) 100 MG capsule Take 300 mg by mouth 2 (two) times daily.    [provider]  ELDERBERRY PO Take 1 capsule by mouth daily.    [provider]  fexofenadine (ALLEGRA) 180 MG tablet Take 180 mg by mouth daily.    [provider]  halobetasol (ULTRAVATE) 0.05 % cream Apply 1 application topically daily as needed (hemorrhoids).     [provider]  ketotifen (ZADITOR) 0.025 % ophthalmic solution Place 1 drop into both eyes 2 (two) times daily.    [provider]  Magnesium 250 MG TABS Take 250 mg by mouth in the morning and at bedtime.     [provider]  metoprolol tartrate (LOPRESSOR) 25 MG tablet Take 1 tablet (25 mg total) by mouth 2 (two) times daily. 01/28/20   Kathleene Hazel, MD  Multiple Vitamin (MULTIVITAMIN WITH MINERALS) TABS tablet Take 1 tablet by mouth at bedtime.    [provider]  nitroGLYCERIN (NITROSTAT) 0.4 MG SL tablet Place 1 tablet (0.4 mg total) under the tongue every 5 (five) minutes x 3 doses as needed for chest pain. 01/11/20   Leslye Peer, MD  pantoprazole (PROTONIX) 40 MG tablet Take 1 tablet (40 mg total) by mouth daily. 01/11/20   Leslye Peer, MD  Probiotic Product (PROBIOTIC DAILY PO) Take 1 capsule by mouth daily.     [provider]  XARELTO 20 MG TABS tablet  TAKE 1 TABLET BY MOUTH EVERY DAY WITH SUPPER 03/28/20   Kathleene Hazel, MD    Allergies    Iodinated diagnostic agents, Sulfa antibiotics, Aleve [naproxen sodium], Apple, Banana, Beef-derived products, Erythromycin, Fish allergy, Garlic, Sesame seed (diagnostic), Shellfish allergy, Sulfonamide derivatives, Tomato, Iodine, Strawberry (diagnostic), and Sweet potato  Review of Systems   Review of Systems  Constitutional: Negative for fever.  HENT: Negative for nosebleeds.   Respiratory: Positive for cough.   Cardiovascular: Negative for chest pain.  Gastrointestinal: Negative for blood in stool and vomiting.  All other systems reviewed and are negative.   Physical Exam Updated  Vital Signs BP (!) 141/80 (BP Location: Right Arm)   Pulse 66   Temp 97.6 F (36.4 C) (Oral)   Resp 16   Ht 1.778 m (5\' 10" )   Wt 77.1 kg   SpO2 99%   BMI 24.39 kg/m   Physical Exam CONSTITUTIONAL: Well developed/well nourished HEAD: Normocephalic/atraumatic EYES: EOMI/PERRL ENMT: Mucous membranes moist, no blood in nare or mouth. NECK: supple no meningeal signs SPINE/BACK:entire spine nontender CV: S1/S2 noted, no murmurs/rubs/gallops noted LUNGS: Lungs are clear to auscultation bilaterally, no apparent distress ABDOMEN: soft, nontender NEURO: Pt is awake/alert/appropriate, moves all extremitiesx4.  No facial droop.   EXTREMITIES: pulses normal/equal, full ROM SKIN: warm, color normal PSYCH: no abnormalities of mood noted, alert and oriented to situation  ED Results / Procedures / Treatments   Labs (all labs ordered are listed, but only abnormal results are displayed) Labs Reviewed  CBC - Abnormal; Notable for the following components:      Result Value   WBC 10.7 (*)    Hemoglobin 11.7 (*)    MCV 78.7 (*)    MCH 23.3 (*)    MCHC 29.6 (*)    RDW 15.9 (*)    All other components within normal limits  BASIC METABOLIC PANEL  PROTIME-INR    EKG None  Radiology DG Chest Portable  1 View  Result Date: 07/22/2020 CLINICAL DATA:  Hemoptysis EXAM: PORTABLE CHEST 1 VIEW COMPARISON:  02/04/2019 FINDINGS: 6 mm pulmonary nodule within the a right upper lung zone with associated linear scarring appears stable since prior examination, though is not optimally assessed by this technique. The lungs are otherwise clear. No pneumothorax or pleural effusion. Cardiac size within normal limits. Pulmonary vascularity is normal. No acute bone abnormality. IMPRESSION: No radiographic evidence of acute cardiopulmonary disease. Electronically Signed   By: 02/06/2019 MD   On: 07/22/2020 00:58    Procedures Procedures   Medications Ordered in ED Medications - No data to display  ED Course  I have reviewed the triage vital signs and the nursing notes.  Pertinent labs & imaging results that were available during my care of the patient were reviewed by me and considered in my medical decision making (see chart for details).    MDM Rules/Calculators/A&P                          3:31 AM Patient on anticoagulation presents with episodes of hemoptysis.  He is currently in no distress. No CP at this time Patient well-known to pulmonology for nodular disease.  He gets frequent CT scans.  However he has reported IV contrast allergy.  He reports this was a distant allergy.  He reports he underwent a cardiac catheterization several years ago and received IV dye, and had no issues after being given Benadryl.  Patient will be given prep with steroids and Benadryl and then undergo CT angio chest to rule out PE or other acute pulmonary complication 7:02 AM Pt stable Awaiting CT chest Signed out to Dr. 09/21/2020 at shift change to f/u on CT scan  Final Clinical Impression(s) / ED Diagnoses Final diagnoses:  None    Rx / DC Orders ED Discharge Orders    None       Rodena Medin, MD 07/22/20 [id number]

## 2020-07-22 NOTE — ED Notes (Signed)
Discharge instructions discussed with patient, verbalized understanding, discharged from ED in NAD

## 2020-07-22 NOTE — Discharge Instructions (Addendum)
Please return for any problem.  Follow-up with Dr. Solon Augusta as instructed on Monday morning.  If you have recurrent bleeding or other concerning symptoms please return immediately to the ED.

## 2020-07-24 NOTE — Telephone Encounter (Signed)
I sent him a MyChart message. Will wait to hear back from him then add to this note. Travis Jacobs

## 2020-07-25 NOTE — Telephone Encounter (Signed)
RB your next available appt is 11/2.  You have held spots before then.  Ok to use a held spot, or prefer for him to see a NP?

## 2020-07-25 NOTE — Telephone Encounter (Signed)
RB please advise- pt was seen in ED this weekend at our office's request d/t multiple episodes of hemoptysis.  CTA chest was performed in hospital.  Documentation of visit is available in Epic.  Please advise on recs- pt wants to know if you need to speak to him directly about what's happened, or please advise if we need to schedule an OV with you for follow-up.

## 2020-07-25 NOTE — Telephone Encounter (Signed)
Agree an OV would be helpful so we can discuss.

## 2020-07-26 ENCOUNTER — Telehealth: Payer: Self-pay | Admitting: Emergency Medicine

## 2020-07-26 NOTE — Telephone Encounter (Signed)
Called and spoke with the pt  I have scheduled him to see RB tomorrow at 9 am to discuss recent ED visit

## 2020-07-26 NOTE — Telephone Encounter (Signed)
Lm for patient.  

## 2020-07-27 ENCOUNTER — Encounter: Payer: Self-pay | Admitting: Emergency Medicine

## 2020-07-27 ENCOUNTER — Ambulatory Visit (INDEPENDENT_AMBULATORY_CARE_PROVIDER_SITE_OTHER): Payer: Medicare Other | Admitting: Emergency Medicine

## 2020-07-27 ENCOUNTER — Other Ambulatory Visit: Payer: Self-pay

## 2020-07-27 VITALS — BP 138/72 | HR 72 | Temp 97.2°F | Ht 70.0 in | Wt 180.6 lb

## 2020-07-27 DIAGNOSIS — I2510 Atherosclerotic heart disease of native coronary artery without angina pectoris: Secondary | ICD-10-CM

## 2020-07-27 DIAGNOSIS — R042 Hemoptysis: Secondary | ICD-10-CM | POA: Diagnosis not present

## 2020-07-27 NOTE — Progress Notes (Signed)
Subjective:    Patient ID: Travis Jacobs, male    DOB: 25-Sep-1951, 69 y.o.   MRN: [id number]  HPI 69 year old former smoker (20 pack years) with a history of CAD/PTCI, GERD with hiatal hernia and esophageal strictures (dilated Dr. Marina Goodell), IBS, paroxysmal atrial fibrillation on Xarelto.  He is referred today for evaluation hemoptysis and pulmonary nodular disease on CT scan of the chest.  He underwent CT chest 02/09/2019, again 08/11/2019 and then most recently 12/06/2019.  I have reviewed all of these.  The original scan identified some apical scarring 9 mm right upper lobe density associated with linear scar.  This area was actually less prominent on scan 08/11/2019, but a new 8 mm nodule was noted in the superior segment of the right lower lobe.  Finally the scan from 12/06/2019 shows stable right upper lobe nodule associated with scar, resolution of 1 right lower lobe nodule but appearance of 6.5 mm hazy groundglass opacity, new 8 mm right lower lobe nodule with some surrounding tree-in-bud changes.  First saw scant blood w mucous in 01/2019, wasn't associated with any other symptoms. He has had two other episodes, both without other sx.  He describes some epistaxis occasionally, but didn't necessarily correlate with the blood. Otherwise minimal cough. Denies fevers or constitutional sx. Has had sinusitis remotely but not recently and not associated with these episodes.   ROV 02/09/2020  -- 69 year old former smoker who follows up today for his pulmonary nodular disease, scant hemoptysis.  Serial CT scans of the chest have identified some waxing and waning tree-in-bud nodular disease.  He underwent bronchoscopy 01/11/2020 to evaluate for possible opportunistic infection, mycobacterial disease.  No blood was seen.  No endobronchial lesions.  Cultures performed. QuantiFERON gold 12/30/2019 negative  AFB smear 01/11/2020 negative, final culture data pending Fungal culture 01/11/2020  negative MDM: Cardiology note 01/28/2020 He is still having some cough, no hemoptysis. He is on fexofenadine and is having some daily allergies.   ROV 07/03/20 --this is a follow-up visit for 69 year old former smoker with a history of waxing waning tree-in-bud micronodular disease.  He had bronchoscopy in 12/2019 with a negative fungal, AFB, cytology.  Repeat CT scan of chest on 06/07/2020 reviewed by me, shows waxing and waning micronodular changes: Resolution of his right lower lobe nodules, stable 4 mm right upper lobe nodule, new observation of a 4 mm lateral right lower lobe nodule, bilateral parenchymal scarring.  He has some cough daily, some congestion after eating. No more hemoptysis. He has longstanding SOB that he notices more when he is sedentary, when he lays flat. Seems to be able to exert ok. Minimal cough  ROV 07/27/20 --Travis Jacobs is 66, former smoker (20 pack years) with atrial fibrillation on Xarelto, CAD/PTCI, GERD, esophageal stricture (dilated in the past).  He has been evaluated here for waxing and waning tree-in-bud nodular disease and cough, intermittent hemoptysis.  Bronchoscopy 01/11/2020 did not show any blood, no endobronchial lesion, overall culture negative.  Repeat CT 06/07/2020 with some resolution of right lower lobe nodular disease.  He returns today reporting that he began to see recurrent hemoptysis beginning about 1 week ago.  This is bright red blood, amount was about several table spoons - single episode.  He was seen in the emergency department 07/22/2020 and a CT chest was performed that I have reviewed, shows patchy infiltrate in the apical posterior aspect of the right upper lobe similar to 8/25, no pulmonary embolism. Question blood vs PNA?  He does report some  intermittent epistaxis, didn't happen on the 2 days prior to the episode of hemoptysis. No sx to suggest URI or PNA.    Review of Systems  Constitutional: Negative for fever and unexpected weight change.  HENT:  Positive for congestion. Negative for dental problem, ear pain, nosebleeds, postnasal drip, rhinorrhea, sinus pressure, sneezing, sore throat and trouble swallowing.   Eyes: Negative for redness and itching.  Respiratory: Positive for cough and shortness of breath. Negative for chest tightness and wheezing.   Cardiovascular: Negative for palpitations and leg swelling.  Gastrointestinal: Negative for nausea and vomiting.  Genitourinary: Negative for dysuria.  Musculoskeletal: Negative for joint swelling.  Skin: Negative for rash.  Allergic/Immunologic: Negative for environmental allergies, food allergies and immunocompromised state.  Neurological: Negative for headaches.  Hematological: Does not bruise/bleed easily.  Psychiatric/Behavioral: Negative for dysphoric mood. The patient is not nervous/anxious.     Past Medical History:  Diagnosis Date  . A-fib (HCC)   . CAD (coronary artery disease)    a. DES to OM1 02/2016.   . Cataract    bil cateracts  . Diverticulosis   . Esophageal stricture   . GERD (gastroesophageal reflux disease)   . Hemorrhoids   . Hiatal hernia   . Hyperlipidemia   . IBS (irritable bowel syndrome)   . Multiple food allergies    "lots of food allergies; beef, tuna, flounder, potatoes, garlic, tomatoes, apples, bananas, sesame seeds, shellfish  . Pre-diabetes   . PVC's (premature ventricular contractions)      Family History  Problem Relation Age of Onset  . Heart disease Mother   . Heart disease Father   . Colon cancer Neg Hx   . Esophageal cancer Neg Hx   . Rectal cancer Neg Hx   . Stomach cancer Neg Hx      Social History   Socioeconomic History  . Marital status: Married    Spouse name: Not on file  . Number of children: Not on file  . Years of education: Not on file  . Highest education level: Not on file  Occupational History  . Not on file  Tobacco Use  . Smoking status: Former Smoker    Packs/day: 1.00    Years: 20.00    Pack years:  20.00    Types: Cigarettes    Quit date: 10/14/1988    Years since quitting: 31.8  . Smokeless tobacco: Never Used  . Tobacco comment: "quit smoking cigarettes in the 1980s"  Vaping Use  . Vaping Use: Never used  Substance and Sexual Activity  . Alcohol use: Yes    Alcohol/week: 0.0 standard drinks    Comment: 02/27/2016 "nothing in the last few weeks; no regular pattern"  . Drug use: No  . Sexual activity: Yes  Other Topics Concern  . Not on file  Social History Narrative  . Not on file   Social Determinants of Health   Financial Resource Strain:   . Difficulty of Paying Living Expenses: Not on file  Food Insecurity:   . Worried About Programme researcher, broadcasting/film/video in the Last Year: Not on file  . Ran Out of Food in the Last Year: Not on file  Transportation Needs:   . Lack of Transportation (Medical): Not on file  . Lack of Transportation (Non-Medical): Not on file  Physical Activity:   . Days of Exercise per Week: Not on file  . Minutes of Exercise per Session: Not on file  Stress:   . Feeling of Stress :  Not on file  Social Connections:   . Frequency of Communication with Friends and Family: Not on file  . Frequency of Social Gatherings with Friends and Family: Not on file  . Attends Religious Services: Not on file  . Active Member of Clubs or Organizations: Not on file  . Attends BankerClub or Organization Meetings: Not on file  . Marital Status: Not on file  Intimate Partner Violence:   . Fear of Current or Ex-Partner: Not on file  . Emotionally Abused: Not on file  . Physically Abused: Not on file  . 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  Possible chemical exposures, worked in [employer] business Has travelled the world but has not lived anywhere for an extended time.   Allergies  Allergen Reactions  . Iodinated Diagnostic Agents Swelling  . Sulfa Antibiotics Swelling    Mouth swelling  . Aleve [Naproxen Sodium]      Doesn't remember  . Apple Swelling  . Banana Nausea And Vomiting  . Beef-Derived Products Nausea And Vomiting  . Erythromycin Swelling    Mouth swells  . Fish Allergy Swelling  . Garlic Swelling  . Sesame Seed (Diagnostic) Swelling  . Shellfish Allergy Other (See Comments)    Not sure of reaction found out about allergy through [employer]  . Sulfonamide Derivatives Swelling    Mouth swelling  . Tomato Nausea And Vomiting    welts  . Iodine Swelling    "I blow up like a balloon"  . Strawberry (Diagnostic) Rash  . Sweet Potato Other (See Comments)    Sweet and Regular Potatoes, GI symptoms     Outpatient Medications Prior to Visit  Medication Sig Dispense Refill  . acetaminophen (TYLENOL) 500 MG tablet Take 500 mg by mouth every 8 (eight) hours as needed for moderate pain or headache.    . Apoaequorin (PREVAGEN EXTRA STRENGTH) 20 MG CAPS Take 20 mg by mouth daily.    Marland Kitchen. aspirin EC 81 MG tablet Take 1 tablet (81 mg total) by mouth daily. 90 tablet 3  . atorvastatin (LIPITOR) 40 MG tablet Take 1 tablet (40 mg total) by mouth every evening.    . Calcium Polycarbophil (FIBER-CAPS PO) Take 1 capsule by mouth every evening.    Marland Kitchen. CIALIS 5 MG tablet Take 5 mg by mouth daily as needed for erectile dysfunction.   98  . docusate sodium (COLACE) 100 MG capsule Take 300 mg by mouth 2 (two) times daily.    Marland Kitchen. ELDERBERRY PO Take 1 capsule by mouth daily.    . fexofenadine (ALLEGRA) 180 MG tablet Take 180 mg by mouth daily.    . halobetasol (ULTRAVATE) 0.05 % cream Apply 1 application topically daily as needed (hemorrhoids).     Marland Kitchen. ketotifen (ZADITOR) 0.025 % ophthalmic solution Place 1 drop into both eyes 2 (two) times daily.    . Magnesium 250 MG TABS Take 250 mg by mouth in the morning and at bedtime.     . metoprolol tartrate (LOPRESSOR) 25 MG tablet Take 1 tablet (25 mg total) by mouth 2 (two) times daily. 180 tablet 3  . Multiple Vitamin (MULTIVITAMIN WITH MINERALS) TABS tablet Take 1 tablet  by mouth at bedtime.    . nitroGLYCERIN (NITROSTAT) 0.4 MG SL tablet Place 1 tablet (0.4 mg total) under the tongue every 5 (five) minutes x 3 doses as needed for chest pain.    . pantoprazole (PROTONIX) 40 MG tablet  Take 1 tablet (40 mg total) by mouth daily.    . Probiotic Product (PROBIOTIC DAILY PO) Take 1 capsule by mouth daily.     Carlena Hurl 20 MG TABS tablet TAKE 1 TABLET BY MOUTH EVERY DAY WITH SUPPER 90 tablet 1   No facility-administered medications prior to visit.        Objective:   Physical Exam Vitals:   07/27/20 0904  BP: 138/72  Pulse: 72  Temp: (!) 97.2 F (36.2 C)  TempSrc: Temporal  SpO2: 97%  Weight: 180 lb 9.6 oz (81.9 kg)  Height: 5\' 10"  (1.778 m)   Gen: Pleasant, well-nourished, in no distress,  normal affect  ENT: No lesions,  mouth clear,  oropharynx clear, no postnasal drip or blood seen  Neck: No JVD, no stridor  Lungs: No use of accessory muscles, no crackles or wheezing on normal respiration, no wheeze on forced expiration  Cardiovascular: RRR, heart sounds normal, no murmur or gallops, no peripheral edema  Musculoskeletal: No deformities, no cyanosis or clubbing  Neuro: alert, awake, non focal  Skin: Warm, no lesions or rash      Assessment & Plan:  Hemoptysis Recurrent intermittent hemoptysis with another episode about 1 week ago, fairly large volume, several tablespoons, but self-limited, ended.  This time associated with a new irregular infiltrate seen in the right upper lobe unclear etiology.  This could be blood.  Differential includes aspirated epistaxis (although he has not seen any evidence for this in the last few days), friable vasculature in the setting of heavy coughing and anticoagulation.  Consider also a vasculitis, ANCA positive process.  All of his culture data has been negative from his bronchoscopy.  He did not have an endobronchial lesion. We will check ANCA testing today, follow him on his anticoagulation.  Question  whether we may need to modify going forward if this continues to occur.  He will need a repeat CT chest to follow the right upper lobe infiltrate for resolution   We will perform blood work today We will plan to repeat your CT scan of the chest without contrast in 2 months to follow the right upper lobe nodular infiltrate Okay to continue your aspirin and anticoagulation for now. Please call our office if you see any more bleeding.  Try to correlate any bleeding you see with any other findings especially nosebleeding. Follow with Dr. in 2 months after your CT is that we can review together.  Sooner if you have any new problems.    Delton Coombes, MD, PhD 07/27/2020, 1:48 PM  Pulmonary and Critical Care [phone] or if no answer [phone]

## 2020-07-27 NOTE — Patient Instructions (Signed)
We will perform blood work today We will plan to repeat your CT scan of the chest without contrast in 2 months to follow the right upper lobe nodular infiltrate Okay to continue your aspirin and anticoagulation for now. Please call our office if you see any more bleeding.  Try to correlate any bleeding you see with any other findings especially nosebleeding. Follow with Dr. Delton Coombes in 2 months after your CT is that we can review together.  Sooner if you have any new problems.

## 2020-07-27 NOTE — Assessment & Plan Note (Addendum)
Recurrent intermittent hemoptysis with another episode about 1 week ago, fairly large volume, several tablespoons, but self-limited, ended.  This time associated with a new irregular infiltrate seen in the right upper lobe unclear etiology.  This could be blood.  Differential includes aspirated epistaxis (although he has not seen any evidence for this in the last few days), friable vasculature in the setting of heavy coughing and anticoagulation.  Consider also a vasculitis, ANCA positive process.  All of his culture data has been negative from his bronchoscopy.  He did not have an endobronchial lesion. We will check ANCA testing today, follow him on his anticoagulation.  Question whether we may need to modify going forward if this continues to occur.  He will need a repeat CT chest to follow the right upper lobe infiltrate for resolution   We will perform blood work today We will plan to repeat your CT scan of the chest without contrast in 2 months to follow the right upper lobe nodular infiltrate Okay to continue your aspirin and anticoagulation for now. Please call our office if you see any more bleeding.  Try to correlate any bleeding you see with any other findings especially nosebleeding. Follow with Dr. Delton Coombes in 2 months after your CT is that we can review together.  Sooner if you have any new problems.

## 2020-07-28 LAB — MPO/PR-3 (ANCA) ANTIBODIES
Myeloperoxidase Abs: <1 AI
Serine Protease 3: <1 AI

## 2020-07-28 LAB — ANCA SCREEN W REFLEX TITER: ANCA Screen: NEGATIVE

## 2020-08-02 ENCOUNTER — Encounter: Payer: Self-pay | Admitting: *Deleted

## 2020-08-02 NOTE — Progress Notes (Signed)
Letter mailed to pt.  

## 2020-09-21 ENCOUNTER — Other Ambulatory Visit: Payer: Self-pay | Admitting: Cardiovascular Disease

## 2020-09-21 NOTE — Telephone Encounter (Signed)
Age 69, weight 82kg, SCr 0.9 on 07/21/20, CrCl 56mL/min Afib indication, last visit April 2021

## 2020-09-22 ENCOUNTER — Ambulatory Visit
Admission: RE | Admit: 2020-09-22 | Discharge: 2020-09-22 | Disposition: A | Discharge status: Home / Self Care | Payer: Medicare Other | Source: Ambulatory Visit | Attending: Emergency Medicine | Admitting: Emergency Medicine

## 2020-09-22 DIAGNOSIS — R042 Hemoptysis: Secondary | ICD-10-CM

## 2020-09-28 ENCOUNTER — Ambulatory Visit (INDEPENDENT_AMBULATORY_CARE_PROVIDER_SITE_OTHER): Payer: Medicare Other | Admitting: Emergency Medicine

## 2020-09-28 ENCOUNTER — Other Ambulatory Visit: Payer: Self-pay

## 2020-09-28 ENCOUNTER — Encounter: Payer: Self-pay | Admitting: Emergency Medicine

## 2020-09-28 DIAGNOSIS — I2510 Atherosclerotic heart disease of native coronary artery without angina pectoris: Secondary | ICD-10-CM | POA: Diagnosis not present

## 2020-09-28 DIAGNOSIS — R918 Other nonspecific abnormal finding of lung field: Secondary | ICD-10-CM

## 2020-09-28 DIAGNOSIS — R042 Hemoptysis: Secondary | ICD-10-CM | POA: Diagnosis not present

## 2020-09-28 NOTE — Progress Notes (Signed)
Subjective:    Patient ID: Travis Jacobs, male    DOB: 1951-01-14, 69 y.o.   MRN: [id number]  HPI 69 year old former smoker (20 pack years) with a history of CAD/PTCI, GERD with hiatal hernia and esophageal strictures (dilated Dr. Marina Goodell), IBS, paroxysmal atrial fibrillation on Xarelto.  He is referred today for evaluation hemoptysis and pulmonary nodular disease on CT scan of the chest.  He underwent CT chest 02/09/2019, again 08/11/2019 and then most recently 12/06/2019.  I have reviewed all of these.  The original scan identified some apical scarring 9 mm right upper lobe density associated with linear scar.  This area was actually less prominent on scan 08/11/2019, but a new 8 mm nodule was noted in the superior segment of the right lower lobe.  Finally the scan from 12/06/2019 shows stable right upper lobe nodule associated with scar, resolution of 1 right lower lobe nodule but appearance of 6.5 mm hazy groundglass opacity, new 8 mm right lower lobe nodule with some surrounding tree-in-bud changes.  First saw scant blood w mucous in 01/2019, wasn't associated with any other symptoms. He has had two other episodes, both without other sx.  He describes some epistaxis occasionally, but didn't necessarily correlate with the blood. Otherwise minimal cough. Denies fevers or constitutional sx. Has had sinusitis remotely but not recently and not associated with these episodes.   ROV 02/09/2020  -- 69 year old former smoker who follows up today for his pulmonary nodular disease, scant hemoptysis.  Serial CT scans of the chest have identified some waxing and waning tree-in-bud nodular disease.  He underwent bronchoscopy 01/11/2020 to evaluate for possible opportunistic infection, mycobacterial disease.  No blood was seen.  No endobronchial lesions.  Cultures performed. QuantiFERON gold 12/30/2019 negative  AFB smear 01/11/2020 negative, final culture data pending Fungal culture 01/11/2020  negative MDM: Cardiology note 01/28/2020 He is still having some cough, no hemoptysis. He is on fexofenadine and is having some daily allergies.   ROV 07/03/20 --this is a follow-up visit for 69 year old former smoker with a history of waxing waning tree-in-bud micronodular disease.  He had bronchoscopy in 12/2019 with a negative fungal, AFB, cytology.  Repeat CT scan of chest on 06/07/2020 reviewed by me, shows waxing and waning micronodular changes: Resolution of his right lower lobe nodules, stable 4 mm right upper lobe nodule, new observation of a 4 mm lateral right lower lobe nodule, bilateral parenchymal scarring.  He has some cough daily, some congestion after eating. No more hemoptysis. He has longstanding SOB that he notices more when he is sedentary, when he lays flat. Seems to be able to exert ok. Minimal cough  ROV 07/27/20 --Travis Jacobs is 57, former smoker (20 pack years) with atrial fibrillation on Xarelto, CAD/PTCI, GERD, esophageal stricture (dilated in the past).  He has been evaluated here for waxing and waning tree-in-bud nodular disease and cough, intermittent hemoptysis.  Bronchoscopy 01/11/2020 did not show any blood, no endobronchial lesion, overall culture negative.  Repeat CT 06/07/2020 with some resolution of right lower lobe nodular disease.  He returns today reporting that he began to see recurrent hemoptysis beginning about 1 week ago.  This is bright red blood, amount was about several table spoons - single episode.  He was seen in the emergency department 07/22/2020 and a CT chest was performed that I have reviewed, shows patchy infiltrate in the apical posterior aspect of the right upper lobe similar to 8/25, no pulmonary embolism. Question blood vs PNA?  He does report some  intermittent epistaxis, didn't happen on the 2 days prior to the episode of hemoptysis. No sx to suggest URI or PNA.   ROV 09/28/20 --follow-up visit for 69 year old former smoker with atrial fibrillation on  anticoagulation, CAD, GERD, history of esophageal strictures.  I have seen him for intermittent hemoptysis, cough and some waxing/waning tree-in-bud nodular disease by CT.  His bronchoscopy March 2021 did not show any blood, endobronchial lesion or clear source.  He had a repeat CT most recently 05/2020 showed improved right lower lobe nodular disease.  His last hemoptysis was late August. He has had some epistaxis, is planning to see Dr Pollyann Kennedyosen  Repeat CT scan of the chest on 09/22/2020 reviewed by me, shows no clear evidence for micronodular disease.  There is some mild stable linear scar/atelectasis noted in all lobes on the right, in the left lower lobe. Labs 07/27/2020 >> ANCA screen negative, MPO/PR-3 negative   Review of Systems  Constitutional: Negative for fever and unexpected weight change.  HENT: Positive for congestion. Negative for dental problem, ear pain, nosebleeds, postnasal drip, rhinorrhea, sinus pressure, sneezing, sore throat and trouble swallowing.   Eyes: Negative for redness and itching.  Respiratory: Positive for cough and shortness of breath. Negative for chest tightness and wheezing.   Cardiovascular: Negative for palpitations and leg swelling.  Gastrointestinal: Negative for nausea and vomiting.  Genitourinary: Negative for dysuria.  Musculoskeletal: Negative for joint swelling.  Skin: Negative for rash.  Allergic/Immunologic: Negative for environmental allergies, food allergies and immunocompromised state.  Neurological: Negative for headaches.  Hematological: Does not bruise/bleed easily.  Psychiatric/Behavioral: Negative for dysphoric mood. The patient is not nervous/anxious.     Past Medical History:  Diagnosis Date  . A-fib (HCC)   . CAD (coronary artery disease)    a. DES to OM1 02/2016.   . Cataract    bil cateracts  . Diverticulosis   . Esophageal stricture   . GERD (gastroesophageal reflux disease)   . Hemorrhoids   . Hiatal hernia   . Hyperlipidemia    . IBS (irritable bowel syndrome)   . Multiple food allergies    "lots of food allergies; beef, tuna, flounder, potatoes, garlic, tomatoes, apples, bananas, sesame seeds, shellfish  . Pre-diabetes   . PVC's (premature ventricular contractions)      Family History  Problem Relation Age of Onset  . Heart disease Mother   . Heart disease Father   . Colon cancer Neg Hx   . Esophageal cancer Neg Hx   . Rectal cancer Neg Hx   . Stomach cancer Neg Hx      Social History   Socioeconomic History  . Marital status: Married    Spouse name: Not on file  . Number of children: Not on file  . Years of education: Not on file  . Highest education level: Not on file  Occupational History  . Not on file  Tobacco Use  . Smoking status: Former Smoker    Packs/day: 1.00    Years: 20.00    Pack years: 20.00    Types: Cigarettes    Quit date: 10/14/1988    Years since quitting: 31.9  . Smokeless tobacco: Never Used  . Tobacco comment: "quit smoking cigarettes in the 1980s"  Vaping Use  . Vaping Use: Never used  Substance and Sexual Activity  . Alcohol use: Yes    Alcohol/week: 0.0 standard drinks    Comment: 02/27/2016 "nothing in the last few weeks; no regular pattern"  . Drug use:  No  . Sexual activity: Yes  Other Topics Concern  . Not on file  Social History Narrative  . 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  Possible chemical exposures, worked in [employer] Has travelled the world but has not lived anywhere for an extended time.   Allergies  Allergen Reactions  . Iodinated Diagnostic Agents Swelling  . Sulfa Antibiotics Swelling    Mouth swelling  . Aleve  [Naproxen Sodium]     Doesn't remember  . Apple Swelling  . Banana Nausea And Vomiting  . Beef-Derived Products Nausea And Vomiting  . Erythromycin Swelling    Mouth swells  . Fish Allergy Swelling  . Garlic Swelling  . Sesame Seed (Diagnostic) Swelling  . Shellfish Allergy Other (See Comments)    Not sure of reaction found out about allergy through [place]  . Sulfonamide Derivatives Swelling    Mouth swelling  . Tomato Nausea And Vomiting    welts  . Iodine Swelling    "I blow up like a balloon"  . Strawberry (Diagnostic) Rash  . Sweet Potato Other (See Comments)    Sweet and Regular Potatoes, GI symptoms     Outpatient Medications Prior to Visit  Medication Sig Dispense Refill  . acetaminophen (TYLENOL) 500 MG tablet Take 500 mg by mouth every 8 (eight) hours as needed for moderate pain or headache.    . Apoaequorin (PREVAGEN EXTRA STRENGTH) 20 MG CAPS Take 20 mg by mouth daily.    Marland Kitchen aspirin EC 81 MG tablet Take 1 tablet (81 mg total) by mouth daily. 90 tablet 3  . atorvastatin (LIPITOR) 40 MG tablet Take 1 tablet (40 mg total) by mouth every evening.    . Calcium Polycarbophil (FIBER-CAPS PO) Take 1 capsule by mouth every evening.    Marland Kitchen CIALIS 5 MG tablet Take 5 mg by mouth daily as needed for erectile dysfunction.   98  . docusate sodium (COLACE) 100 MG capsule Take 300 mg by mouth 2 (two) times daily.    Marland Kitchen ELDERBERRY PO Take 1 capsule by mouth daily.    . fexofenadine (ALLEGRA) 180 MG tablet Take 180 mg by mouth daily.    . halobetasol (ULTRAVATE) 0.05 % cream Apply 1 application topically daily as needed (hemorrhoids).     Marland Kitchen ketotifen (ZADITOR) 0.025 % ophthalmic solution Place 1 drop into both eyes 2 (two) times daily.    . Magnesium 250 MG TABS Take 250 mg by mouth in the morning and at bedtime.     . metoprolol tartrate (LOPRESSOR) 25 MG tablet Take 1 tablet (25 mg total) by mouth 2 (two) times daily. 180 tablet 3  . Multiple Vitamin (MULTIVITAMIN WITH MINERALS) TABS  tablet Take 1 tablet by mouth at bedtime.    . nitroGLYCERIN (NITROSTAT) 0.4 MG SL tablet Place 1 tablet (0.4 mg total) under the tongue every 5 (five) minutes x 3 doses as needed for chest pain.    . pantoprazole (PROTONIX) 40 MG tablet Take 1 tablet (40 mg total) by mouth daily.    . Probiotic Product (PROBIOTIC DAILY PO) Take 1 capsule by mouth daily.     Carlena Hurl  20 MG TABS tablet TAKE 1 TABLET BY MOUTH EVERY DAY WITH SUPPER 90 tablet 1   No facility-administered medications prior to visit.        Objective:   Physical Exam Vitals:   09/28/20 0851  BP: 124/76  Pulse: 67  Temp: (!) 97.3 F (36.3 C)  SpO2: 99%  Weight: 183 lb (83 kg)  Height: 5\' 10"  (1.778 m)   Gen: Pleasant, well-nourished, in no distress,  normal affect  ENT: No lesions,  mouth clear,  oropharynx clear, no postnasal drip or blood seen  Neck: No JVD, no stridor  Lungs: No use of accessory muscles, no crackles or wheezing on normal respiration, no wheeze on forced expiration  Cardiovascular: RRR, heart sounds normal, no murmur or gallops, no peripheral edema  Musculoskeletal: No deformities, no cyanosis or clubbing  Neuro: alert, awake, non focal  Skin: Warm, no lesions or rash      Assessment & Plan:  Pulmonary nodules/lesions, multiple Focal micronodular disease has resolved on most recent CT chest.  Question whether this was infectious inflammatory, question whether this represented blood as it was in the setting of his hemoptysis.  ANCA, MPO are negative.  No indication to follow serial films unless there is a clinical change  Hemoptysis Resolved at this time.  Work-up is been reassuring including bronchoscopy.  He has some mild stable linear scar in all lobes on the right, in the left lower lobe, no residual evidence for active inflammatory process.  ANCA and MPO negative as above.  In retrospect question whether some of this may related to epistaxis.  He is planning to follow with Dr. with  ENT for evaluation.  He may benefit from moistening his nares especially if he has to go back on his Allegra.  No plans for any repeat imaging or procedures unless he has recurrent bleeding.  If so he will contact me and we will plan next steps.    Pollyann Kennedy, MD, PhD 09/28/2020, 10:48 AM [facility] [phone] or if no answer [phone]

## 2020-09-28 NOTE — Assessment & Plan Note (Addendum)
Focal micronodular disease has resolved on most recent CT chest.  Question whether this was infectious inflammatory, question whether this represented blood as it was in the setting of his hemoptysis.  ANCA, MPO are negative.  No indication to follow serial films unless there is a clinical change

## 2020-09-28 NOTE — Assessment & Plan Note (Addendum)
Resolved at this time.  Work-up is been reassuring including bronchoscopy.  He has some mild stable linear scar in all lobes on the right, in the left lower lobe, no residual evidence for active inflammatory process.  ANCA and MPO negative as above.  In retrospect question whether some of this may related to epistaxis while on Xarelto.  He is planning to follow with Dr. Pollyann Kennedy with ENT for evaluation.  He may benefit from moistening his nares especially if he has to go back on his Allegra.  No plans for any repeat imaging or procedures unless he has recurrent bleeding.  If so he will contact me and we will plan next steps.

## 2020-09-28 NOTE — Patient Instructions (Signed)
Your CT scan of the chest is reassuring.  Any evidence of active inflammation or blood has resolved.  There is some very mild residual scar that persists.  This is good news Agree with evaluation by Dr. Pollyann Kennedy with ENT If you need to restart your Allegra for congestion, allergies then consider using a nasal saline spray to keep your nasal passages moist and avoid any bleeding. Please call our office and follow Dr. Delton Coombes if you experience any more coughing up blood.

## 2020-10-05 DIAGNOSIS — Z7901 Long term (current) use of anticoagulants: Secondary | ICD-10-CM | POA: Insufficient documentation

## 2020-12-14 ENCOUNTER — Ambulatory Visit (INDEPENDENT_AMBULATORY_CARE_PROVIDER_SITE_OTHER): Payer: Medicare Other | Admitting: Internal Medicine

## 2020-12-14 ENCOUNTER — Other Ambulatory Visit (INDEPENDENT_AMBULATORY_CARE_PROVIDER_SITE_OTHER): Payer: Medicare Other

## 2020-12-14 ENCOUNTER — Encounter: Payer: Self-pay | Admitting: Internal Medicine

## 2020-12-14 VITALS — BP 140/76 | HR 80 | Ht 70.0 in | Wt 181.0 lb

## 2020-12-14 DIAGNOSIS — R1310 Dysphagia, unspecified: Secondary | ICD-10-CM

## 2020-12-14 DIAGNOSIS — K219 Gastro-esophageal reflux disease without esophagitis: Secondary | ICD-10-CM | POA: Diagnosis not present

## 2020-12-14 DIAGNOSIS — D509 Iron deficiency anemia, unspecified: Secondary | ICD-10-CM | POA: Diagnosis not present

## 2020-12-14 DIAGNOSIS — K625 Hemorrhage of anus and rectum: Secondary | ICD-10-CM

## 2020-12-14 DIAGNOSIS — Z7901 Long term (current) use of anticoagulants: Secondary | ICD-10-CM

## 2020-12-14 LAB — CBC WITH DIFFERENTIAL/PLATELET
Basophils Absolute: 0.1 10*3/uL (ref 0.0–0.1)
Basophils Relative: 1 % (ref 0.0–3.0)
Eosinophils Absolute: 0.1 10*3/uL (ref 0.0–0.7)
Eosinophils Relative: 1.4 % (ref 0.0–5.0)
HCT: 42 % (ref 39.0–52.0)
Hemoglobin: 14.1 g/dL (ref 13.0–17.0)
Lymphocytes Relative: 17.1 % (ref 12.0–46.0)
Lymphs Abs: 1.4 10*3/uL (ref 0.7–4.0)
MCHC: 33.5 g/dL (ref 30.0–36.0)
MCV: 87.3 fl (ref 78.0–100.0)
Monocytes Absolute: 0.6 10*3/uL (ref 0.1–1.0)
Monocytes Relative: 7.8 % (ref 3.0–12.0)
Neutro Abs: 6.1 10*3/uL (ref 1.4–7.7)
Neutrophils Relative %: 72.7 % (ref 43.0–77.0)
Platelets: 273 10*3/uL (ref 150.0–400.0)
RBC: 4.82 Mil/uL (ref 4.22–5.81)
RDW: 15 % (ref 11.5–15.5)
WBC: 8.3 10*3/uL (ref 4.0–10.5)

## 2020-12-14 LAB — FERRITIN: Ferritin: 28 ng/mL (ref 22.0–322.0)

## 2020-12-14 MED ORDER — PLENVU 140 G PO SOLR: 1.0000 | Freq: Once | ORAL | 0 refills | Status: AC

## 2020-12-14 NOTE — Patient Instructions (Signed)
Your provider has requested that you go to the basement level for lab work before leaving today. Press "B" on the elevator. The lab is located at the first door on the left as you exit the elevator.  You have been scheduled for a colonoscopy. Please follow written instructions given to you at your visit today.  Please pick up your prep supplies at the pharmacy within the next 1-3 days. If you use inhalers (even only as needed), please bring them with you on the day of your procedure.   I will call Dr. Joycelyn Das office to try to get you an appointment

## 2020-12-14 NOTE — Progress Notes (Signed)
HISTORY OF PRESENT ILLNESS:  Travis Jacobs is a 70 y.o. male with coronary artery disease and history of atrial fibrillation for which he has been on Xarelto.  He was last seen in this office 11/23/2019 regarding significantly symptomatic internal hemorrhoids with chronic rectal bleeding.  See that dictation.  He also has a history of GERD complicated by esophagitis and peptic stricture for which she is on chronic pantoprazole therapy.  For his hemorrhoids he was referred to [facility] general surgery.  He saw Dr. Byrd Hesselbach.  He did have a hemorrhoidectomy scheduled but this was canceled due to Covid.  He tells me that he is interested in hemorrhoidectomy but wishes to see Dr. Inetta Fermo. Patient has been seen pulmonary for abnormal chest radiographs and most recently related hemoptysis.  No significant process identified.  He has undergone previous bronchoscopy.  Patient did notice that during his hospital emergency room visit July 21, 2020 his hemoglobin was 11.7 and MCV 78.7.  Realize that he may have iron deficiency.  Discussed this with his friend, and former primary care provider, Dr. Chilton Si who recommend GI evaluation.  Patient continues with significant intermittent bleeding.  He does take himself off of Xarelto several occasions.  Currently off Xarelto.  His last upper endoscopy was performed April 11, 2015.  At that time he was found to have peptic stricture and hiatal hernia.  His esophagus was dilated with 54 [place] Maloney dilator.  He does report some intermittent dysphagia and may benefit from repeat dilation.  His last complete colonoscopy was performed April 11, 2015.  In addition to mild sigmoid diverticulosis, significant hemorrhoids.  He does report that his stools are somewhat a bit darker.  He is taking Slow Fe once daily.  He has completed his COVID vaccination series  REVIEW OF SYSTEMS:  All non-GI ROS negative unless otherwise stated in the HPI except for  epistaxis, visual change  Past Medical History:  Diagnosis Date   A-fib (HCC)    CAD (coronary artery disease)    a. DES to OM1 02/2016.    Cataract    bil cateracts   Diverticulosis    Esophageal stricture    GERD (gastroesophageal reflux disease)    Hemorrhoids    Hiatal hernia    Hyperlipidemia    IBS (irritable bowel syndrome)    Multiple food allergies    "lots of food allergies; beef, tuna, flounder, potatoes, garlic, tomatoes, apples, bananas, sesame seeds, shellfish   Pre-diabetes    PVC's (premature ventricular contractions)     Past Surgical History:  Procedure Laterality Date   BRONCHIAL WASHINGS  01/11/2020   Procedure: BRONCHIAL WASHINGS;  Surgeon: Leslye Peer, MD;  Location: [facility] ENDOSCOPY;  Service: Cardiopulmonary;;   CARDIAC CATHETERIZATION N/A 02/28/2016   Procedure: Left Heart Cath and Coronary Angiography;  Surgeon: Iran Ouch, MD;  Location: [facility] INVASIVE CV LAB;  Service: Cardiovascular;  Laterality: N/A;   CARDIAC CATHETERIZATION N/A 02/28/2016   Procedure: Coronary Stent Intervention;  Surgeon: Iran Ouch, MD;  Location: [facility] INVASIVE CV LAB;  Service: Cardiovascular;  Laterality: N/A;   COLONOSCOPY     CYSTOSCOPY WITH STENT PLACEMENT     ESOPHAGOGASTRODUODENOSCOPY (EGD) WITH ESOPHAGEAL DILATION     HEMORRHOID BANDING     HERNIA REPAIR     SURGERY SCROTAL / TESTICULAR Left ~ 1960   "hernia"   TONSILLECTOMY     UPPER GASTROINTESTINAL ENDOSCOPY     VIDEO BRONCHOSCOPY N/A 01/11/2020   Procedure:  VIDEO BRONCHOSCOPY WITHOUT FLUORO;  Surgeon: Leslye Peer, MD;  Location: [facility] ENDOSCOPY;  Service: Cardiopulmonary;  Laterality: N/A;    Social History Travis Jacobs  reports that he quit smoking about 32 years ago. His smoking use included cigarettes. He has a 20.00 pack-year smoking history. He has never used smokeless tobacco. He reports current alcohol use. He reports that he does not use drugs.  family history includes  Heart disease in his father and mother.  Allergies  Allergen Reactions   Iodinated Diagnostic Agents Swelling   Sulfa Antibiotics Swelling    Mouth swelling   Aleve [Naproxen Sodium]     Doesn't remember   Apple Swelling   Banana Nausea And Vomiting   Beef-Derived Products Nausea And Vomiting   Erythromycin Swelling    Mouth swells   Fish Allergy Swelling   Garlic Swelling   Sesame Seed (Diagnostic) Swelling   Shellfish Allergy Other (See Comments)    Not sure of reaction found out about allergy through [place]   Sulfonamide Derivatives Swelling    Mouth swelling   Tomato Nausea And Vomiting    welts   Iodine Swelling    "I blow up like a balloon"   Strawberry (Diagnostic) Rash   Sweet Potato Other (See Comments)    Sweet and Regular Potatoes, GI symptoms       PHYSICAL EXAMINATION: Vital signs: BP 140/76    Pulse 80    Ht 5\' 10"  (1.778 m)    Wt 181 lb (82.1 kg)    SpO2 99%    BMI 25.97 kg/m   Constitutional: generally well-appearing, no acute distress Psychiatric: alert and oriented x3, cooperative Eyes: extraocular movements intact, anicteric, conjunctiva pink Mouth: oral pharynx moist, no lesions Neck: supple no lymphadenopathy Cardiovascular: heart regular rate and rhythm, no murmur Lungs: clear to auscultation bilaterally Abdomen: soft, nontender, nondistended, no obvious ascites, no peritoneal signs, normal bowel sounds, no organomegaly Rectal: Deferred until colonoscopy Extremities: no clubbing, cyanosis, or lower extremity edema bilaterally Skin: no lesions on visible extremities Neuro: No focal deficits.  Cranial nerves intact  ASSESSMENT:  1.  Iron deficiency anemia.  Certainly due to chronic hemorrhoidal blood loss.  On Slow Fe once daily 2.  Large symptomatic internal hemorrhoids 3.  Colonoscopy 2016 reticulosis and hemorrhoids 4.  Prior upper endoscopy with esophageal stricture requiring dilation.  Now with some dysphagia.  Maintained  on chronic PPI for GERD   PLAN:  1.  Schedule colonoscopy to evaluate iron deficiency anemia and rule out any contributor/hemorrhoids.  Patient is high risk given his comorbidities and anticoagulation state.The nature of the procedure, as well as the risks, benefits, and alternatives were carefully and thoroughly reviewed with the patient. Ample time for discussion and questions allowed. The patient understood, was satisfied, and agreed to proceed. 2.  Schedule upper endoscopy to evaluate iron deficiency anemia as well as perform esophageal dilation for symptomatic peptic stricture.The nature of the procedure, as well as the risks, benefits, and alternatives were carefully and thoroughly reviewed with the patient. Ample time for discussion and questions allowed. The patient understood, was satisfied, and agreed to proceed. 3.  Reflux precautions 4.  Continue pantoprazole 5.  Continue iron replacement therapy until 1 week prior to procedure 6.  If the patient resume Xarelto, he should hold 2 days prior to his procedures. 7.  Repeat blood work today including CBC and ferritin level 8.  Refer to Dr. 2017 at [facility] regarding hemorrhoidectomy per patient request A  total time of 40 minutes was spent preparing to see the patient, reviewing outside records and tests.  Obtaining comprehensive history.  Performing comprehensive physical examination.  Counseling the patient regarding his above listed issues.  Ordering blood work and advanced and therapeutic procedures.  Documenting clinical information in health record

## 2020-12-20 ENCOUNTER — Encounter: Payer: Self-pay | Admitting: Internal Medicine

## 2020-12-20 ENCOUNTER — Other Ambulatory Visit: Payer: Self-pay

## 2020-12-20 ENCOUNTER — Ambulatory Visit (AMBULATORY_SURGERY_CENTER): Payer: Medicare Other | Admitting: Internal Medicine

## 2020-12-20 VITALS — BP 103/62 | HR 77 | Temp 96.6°F | Resp 16 | Ht 70.0 in | Wt 181.0 lb

## 2020-12-20 DIAGNOSIS — K5730 Diverticulosis of large intestine without perforation or abscess without bleeding: Secondary | ICD-10-CM | POA: Diagnosis not present

## 2020-12-20 DIAGNOSIS — K648 Other hemorrhoids: Secondary | ICD-10-CM

## 2020-12-20 DIAGNOSIS — K219 Gastro-esophageal reflux disease without esophagitis: Secondary | ICD-10-CM

## 2020-12-20 DIAGNOSIS — R1310 Dysphagia, unspecified: Secondary | ICD-10-CM | POA: Diagnosis not present

## 2020-12-20 DIAGNOSIS — K449 Diaphragmatic hernia without obstruction or gangrene: Secondary | ICD-10-CM | POA: Diagnosis not present

## 2020-12-20 DIAGNOSIS — D509 Iron deficiency anemia, unspecified: Secondary | ICD-10-CM

## 2020-12-20 DIAGNOSIS — K222 Esophageal obstruction: Secondary | ICD-10-CM

## 2020-12-20 DIAGNOSIS — K625 Hemorrhage of anus and rectum: Secondary | ICD-10-CM

## 2020-12-20 HISTORY — PX: COLONOSCOPY WITH ESOPHAGOGASTRODUODENOSCOPY (EGD) AND ESOPHAGEAL DILATION (ED): SHX6495

## 2020-12-20 MED ORDER — SODIUM CHLORIDE 0.9 % IV SOLN: 500.0000 mL | Freq: Once | INTRAVENOUS | Status: DC

## 2020-12-20 NOTE — Progress Notes (Signed)
Pt's states no medical or surgical changes since previsit or office visit.  [place] - vitals 

## 2020-12-20 NOTE — Patient Instructions (Signed)
Handouts on hiatal hernia, hemorrhoids, and diverticulosis given to you today  Await pathology results  Resume Xarelto today    YOU HAD AN ENDOSCOPIC PROCEDURE TODAY AT THE [facility]:   Refer to the procedure report that was given to you for any specific questions about what was found during the examination.  If the procedure report does not answer your questions, please call your gastroenterologist to clarify.  If you requested that your care partner not be given the details of your procedure findings, then the procedure report has been included in a sealed envelope for you to review at your convenience later.  YOU SHOULD EXPECT: Some feelings of bloating in the abdomen. Passage of more gas than usual.  Walking can help get rid of the air that was put into your GI tract during the procedure and reduce the bloating. If you had a lower endoscopy (such as a colonoscopy or flexible sigmoidoscopy) you may notice spotting of blood in your stool or on the toilet paper. If you underwent a bowel prep for your procedure, you may not have a normal bowel movement for a few days.  Please Note:  You might notice some irritation and congestion in your nose or some drainage.  This is from the oxygen used during your procedure.  There is no need for concern and it should clear up in a day or so.  SYMPTOMS TO REPORT IMMEDIATELY:   Following lower endoscopy (colonoscopy or flexible sigmoidoscopy):  Excessive amounts of blood in the stool  Significant tenderness or worsening of abdominal pains  Swelling of the abdomen that is new, acute  Fever of 100F or higher   Following upper endoscopy (EGD)  Vomiting of blood or coffee ground material  New chest pain or pain under the shoulder blades  Painful or persistently difficult swallowing  New shortness of breath  Fever of 100F or higher  Black, tarry-looking stools  For urgent or emergent issues, a gastroenterologist can be reached at any hour  by calling [phone]. Do not use MyChart messaging for urgent concerns.    DIET:  We do recommend a small meal at first, but then you may proceed to your regular diet.  Drink plenty of fluids but you should avoid alcoholic beverages for 24 hours.  ACTIVITY:  You should plan to take it easy for the rest of today and you should NOT DRIVE or use heavy machinery until tomorrow (because of the sedation medicines used during the test).    FOLLOW UP: Our staff will call the number listed on your records 48-72 hours following your procedure to check on you and address any questions or concerns that you may have regarding the information given to you following your procedure. If we do not reach you, we will leave a message.  We will attempt to reach you two times.  During this call, we will ask if you have developed any symptoms of COVID 19. If you develop any symptoms (ie: fever, flu-like symptoms, shortness of breath, cough etc.) before then, please call [phone].  If you test positive for Covid 19 in the 2 weeks post procedure, please call and report this information to [place].    If any biopsies were taken you will be contacted by phone or by letter within the next 1-3 weeks.  Please call us at [phone] if you have not heard about the biopsies in 3 weeks.    SIGNATURES/CONFIDENTIALITY: You and/or your care partner have signed paperwork  which will be entered into your electronic medical record.  These signatures attest to the fact that that the information above on your After Visit Summary has been reviewed and is understood.  Full responsibility of the confidentiality of this discharge information lies with you and/or your care-partner.

## 2020-12-20 NOTE — Progress Notes (Signed)
Called to room to assist during endoscopic procedure.  Patient ID and intended procedure confirmed with present staff. Received instructions for my participation in the procedure from the performing physician.  

## 2020-12-20 NOTE — Op Note (Signed)
[facility] Patient Name: Travis Jacobs Procedure Date: 12/20/2020 10:33 AM MRN: [id number] Endoscopist: Wilhemina Bonito. Marina Goodell , MD Age: 70 Referring MD:  Date of Birth: [birth year] Gender: Male Account #: [id number] Procedure:                Colonoscopy Indications:              Rectal bleeding, Iron deficiency anemia. Previous                            examinations 2005 and 2016 were negative for                            neoplasia Medicines:                Monitored Anesthesia Care Procedure:                Pre-Anesthesia Assessment:                           - Prior to the procedure, a History and Physical                            was performed, and patient medications and                            allergies were reviewed. The patient's tolerance of                            previous anesthesia was also reviewed. The risks                            and benefits of the procedure and the sedation                            options and risks were discussed with the patient.                            All questions were answered, and informed consent                            was obtained. Prior Anticoagulants: The patient has                            taken Xarelto (rivaroxaban), last dose was greater                            than 7 days prior to procedure (patient initiated).                            ASA Grade Assessment: III - A patient with severe                            systemic disease. After reviewing the risks and  benefits, the patient was deemed in satisfactory                            condition to undergo the procedure.                           After obtaining informed consent, the colonoscope                            was passed under direct vision. Throughout the                            procedure, the patient's blood pressure, pulse, and                            oxygen saturations were monitored continuously. The                             Colonoscope was introduced through the anus and                            advanced to the the cecum, identified by                            appendiceal orifice and ileocecal valve. The                            ileocecal valve, appendiceal orifice, and rectum                            were photographed. The quality of the bowel                            preparation was excellent. The colonoscopy was                            performed without difficulty. The patient tolerated                            the procedure well. The bowel preparation used was                            Prepopik via split dose instruction. Scope In: 10:47:10 AM Scope Out: 11:02:42 AM Scope Withdrawal Time: 0 hours 8 minutes 44 seconds  Total Procedure Duration: 0 hours 15 minutes 32 seconds  Findings:                 Multiple diverticula were found in the sigmoid                            colon and right colon.                           Internal hemorrhoids were found during  retroflexion. The hemorrhoids were large, friable                            and prolapsing.                           The exam was otherwise without abnormality on                            direct and retroflexion views. Complications:            No immediate complications. Estimated blood loss:                            None. Estimated Blood Loss:     Estimated blood loss: none. Impression:               - Diverticulosis in the sigmoid colon and in the                            right colon.                           - Large, friable, and prolapsing internal                            hemorrhoids.                           - The examination was otherwise normal on direct                            and retroflexion views.                           - No specimens collected. Recommendation:           - Repeat colonoscopy is not recommended for                            screening  purposes.                           - Resume Xarelto (rivaroxaban) today at prior dose.                           - Patient has a contact number available for                            emergencies. The signs and symptoms of potential                            delayed complications were discussed with the                            patient. Return to normal activities tomorrow.  Written discharge instructions were provided to the                            patient.                           - Resume previous diet.                           - Continue present medications. Resume iron.                           - Surgical hemorrhoidectomy                           - EGD today. Please see report Wilhemina BonitoJohn N. Marina GoodellPerry, MD 12/20/2020 11:11:27 AM This report has been signed electronically.

## 2020-12-20 NOTE — Op Note (Signed)
[facility] Patient Name: Travis ReaderDavid Jacobs Procedure Date: 12/20/2020 10:32 AM MRN: [id number] Endoscopist: Wilhemina BonitoJohn N. Marina GoodellPerry , MD Age: 70 Referring MD:  Date of Birth: 09-Feb-1951 Gender: Male Account #: [id number] Procedure:                Upper GI endoscopy with biopsy; balloon dilation                            esophagus. 20 mm max Indications:              Dysphagia, Iron deficiency anemia Medicines:                Monitored Anesthesia Care Procedure:                Pre-Anesthesia Assessment:                           - Prior to the procedure, a History and Physical                            was performed, and patient medications and                            allergies were reviewed. The patient's tolerance of                            previous anesthesia was also reviewed. The risks                            and benefits of the procedure and the sedation                            options and risks were discussed with the patient.                            All questions were answered, and informed consent                            was obtained. Prior Anticoagulants: The patient has                            taken Xarelto (rivaroxaban), last dose was 7 days                            prior to procedure. ASA Grade Assessment: III - A                            patient with severe systemic disease. After                            reviewing the risks and benefits, the patient was                            deemed in satisfactory condition to undergo the  procedure.                           After obtaining informed consent, the endoscope was                            passed under direct vision. Throughout the                            procedure, the patient's blood pressure, pulse, and                            oxygen saturations were monitored continuously. The                            Endoscope was introduced through the mouth, and                             advanced to the second part of duodenum. The upper                            GI endoscopy was accomplished without difficulty.                            The patient tolerated the procedure well. Scope In: Scope Out: Findings:                 One benign-appearing, intrinsic moderate peptic                            stenosis was found 40 cm from the incisors. This                            stenosis measured 1.5 cm (inner diameter). After                            completing the endoscopic survey, A TTS dilator was                            passed through the scope. Dilation with an 18-19-20                            mm balloon dilator was performed to 20 mm. Scant                            heme.                           The exam of the esophagus was otherwise normal.                           The stomach revealed a small hiatal hernia and                            subtle patchy erythema of  the gastric antrum.                            Biopsies were taken with a cold forceps for                            Helicobacter pylori testing using CLOtest.                           The examined duodenum was normal.                           The cardia and gastric fundus were normal on                            retroflexion. Complications:            No immediate complications. Estimated Blood Loss:     Estimated blood loss: none. Impression:               1. GERD with esophageal stricture status post                            dilation                           2. Mild erythema of the stomach status post CLO                            biopsy                           3. Otherwise unremarkable exam                           4. Iron deficiency anemia secondary to chronic                            blood loss from chronic hemorrhoidal bleeding. Recommendation:           - Patient has a contact number available for                            emergencies. The signs and  symptoms of potential                            delayed complications were discussed with the                            patient. Return to normal activities tomorrow.                            Written discharge instructions were provided to the                            patient.                           -  Resume previous diet.                           - Continue present medications. Resume Xarelto                            today. Resume iron therapy today                           - Await pathology results.                           - Surgical hemorrhoidectomy Wilhemina Bonito. Marina Goodell, MD 12/20/2020 11:26:50 AM This report has been signed electronically.

## 2020-12-20 NOTE — Progress Notes (Signed)
PT taken to PACU. Monitors in place. VSS. Report given to RN. 

## 2020-12-21 LAB — HELICOBACTER PYLORI SCREEN-BIOPSY: UREASE: NEGATIVE

## 2020-12-22 ENCOUNTER — Telehealth: Payer: Self-pay

## 2020-12-22 NOTE — Telephone Encounter (Signed)
Continue to monitor. Thanks for the update

## 2020-12-22 NOTE — Telephone Encounter (Signed)
  Follow up Call-  Call back number 12/20/2020  Post procedure Call Back phone  # [phone]  Permission to leave phone message Yes  Some recent data might be hidden     Patient questions:  Do you have a fever, pain , or abdominal swelling? No. Pain Score  0 *  Have you tolerated food without any problems? Yes.    Have you been able to return to your normal activities? Yes.    Do you have any questions about your discharge instructions: Diet   No. Medications  No. Follow up visit  No.  Do you have questions or concerns about your Care? Yes.  Patient states he had loose brown stools the day of the procedure when he got home, and then his last stool was tarry. No BM since.  Actions: * If pain score is 4 or above: No action needed, pain <4.

## 2020-12-26 ENCOUNTER — Telehealth: Payer: Self-pay

## 2020-12-26 DIAGNOSIS — K648 Other hemorrhoids: Secondary | ICD-10-CM

## 2020-12-26 NOTE — Telephone Encounter (Signed)
Faxed referral to Dr. Drue Dun for hemorrhoids

## 2020-12-29 NOTE — Telephone Encounter (Signed)
Followed with Dr. Joycelyn Das office; scheduled appointment for 04/17/2021 at 9:30am.  I sent him a message with this information and encouraged him to call her office to check on cancellations.

## 2021-02-12 ENCOUNTER — Encounter: Payer: Self-pay | Admitting: Cardiovascular Disease

## 2021-02-12 ENCOUNTER — Ambulatory Visit (INDEPENDENT_AMBULATORY_CARE_PROVIDER_SITE_OTHER): Payer: Medicare Other | Admitting: Cardiovascular Disease

## 2021-02-12 ENCOUNTER — Other Ambulatory Visit: Payer: Self-pay

## 2021-02-12 VITALS — BP 126/68 | HR 62 | Ht 70.0 in | Wt 179.8 lb

## 2021-02-12 DIAGNOSIS — I2510 Atherosclerotic heart disease of native coronary artery without angina pectoris: Secondary | ICD-10-CM

## 2021-02-12 DIAGNOSIS — E7800 Pure hypercholesterolemia, unspecified: Secondary | ICD-10-CM | POA: Diagnosis not present

## 2021-02-12 DIAGNOSIS — I480 Paroxysmal atrial fibrillation: Secondary | ICD-10-CM | POA: Diagnosis not present

## 2021-02-12 NOTE — Progress Notes (Signed)
Chief Complaint  Patient presents with  . Follow-up    CAD   History of Present Illness: 70 yo male with history of CAD, paroxysmal atrial fibrillation, PVCs, HLD and GERD who is here today for cardiac follow up. He had a cardiac cath in May 2017 in the setting of unstable angina which showed a severe stenosis in the first obtuse marginal branch. A drug eluting stent was placed in the first OM. There was mild disease in the RCA and LAD. LV function was normal.  Frequent PVCs noted during his hospital stay. Cardiac monitor June 2017 with frequent PVCS and periods of sinus bradycardia. He was seen in the EP clinic by Dr. Graciela Husbands and he recommended the patient continue his beta blocker. He called our office complaining of palpitations at home mid December 2018 with HR up to the 150s. Cardiac monitor December 2018 showed sinus bradycardia and episodes of atrial fibrillation/flutter. He is now on a beta blocker and Xarelto. He has been followed in pulmonary after mild hemoptysis.   He is here today for follow up. The patient denies any chest pain, dyspnea, palpitations, lower extremity edema, orthopnea, PND, dizziness, near syncope or syncope.    Primary Care Physician: Charlane Ferretti, DO  Past Medical History:  Diagnosis Date  . A-fib (HCC)   . CAD (coronary artery disease)    a. DES to OM1 02/2016.   . Cataract    bil cateracts  . Diverticulosis   . Esophageal stricture   . GERD (gastroesophageal reflux disease)   . Hemorrhoids   . Hiatal hernia   . Hyperlipidemia   . IBS (irritable bowel syndrome)   . Multiple food allergies    "lots of food allergies; beef, tuna, flounder, potatoes, garlic, tomatoes, apples, bananas, sesame seeds, shellfish  . Pre-diabetes   . PVC's (premature ventricular contractions)     Past Surgical History:  Procedure Laterality Date  . BRONCHIAL WASHINGS  01/11/2020   Procedure: BRONCHIAL WASHINGS;  Surgeon: Leslye Peer, MD;  Location: [facility] ENDOSCOPY;   Service: Cardiopulmonary;;  . CARDIAC CATHETERIZATION N/A 02/28/2016   Procedure: Left Heart Cath and Coronary Angiography;  Surgeon: Iran Ouch, MD;  Location: [facility] INVASIVE CV LAB;  Service: Cardiovascular;  Laterality: N/A;  . CARDIAC CATHETERIZATION N/A 02/28/2016   Procedure: Coronary Stent Intervention;  Surgeon: Iran Ouch, MD;  Location: [facility] INVASIVE CV LAB;  Service: Cardiovascular;  Laterality: N/A;  . COLONOSCOPY    . CYSTOSCOPY WITH STENT PLACEMENT    . ESOPHAGOGASTRODUODENOSCOPY (EGD) WITH ESOPHAGEAL DILATION    . HEMORRHOID BANDING    . HERNIA REPAIR    . SURGERY SCROTAL / TESTICULAR Left ~ 1960   "hernia"  . TONSILLECTOMY    . UPPER GASTROINTESTINAL ENDOSCOPY    . VIDEO BRONCHOSCOPY N/A 01/11/2020   Procedure: VIDEO BRONCHOSCOPY WITHOUT FLUORO;  Surgeon: Leslye Peer, MD;  Location: [facility] ENDOSCOPY;  Service: Cardiopulmonary;  Laterality: N/A;    Current Outpatient Medications  Medication Sig Dispense Refill  . acetaminophen (TYLENOL) 500 MG tablet Take 500 mg by mouth every 8 (eight) hours as needed for moderate pain or headache.    . Apoaequorin (PREVAGEN EXTRA STRENGTH) 20 MG CAPS Take 20 mg by mouth daily.    Marland Kitchen aspirin EC 81 MG tablet Take 1 tablet (81 mg total) by mouth daily. 90 tablet 3  . atorvastatin (LIPITOR) 40 MG tablet Take 1 tablet (40 mg total) by mouth every evening.    Marland Kitchen CIALIS 5 MG  tablet Take 5 mg by mouth daily as needed for erectile dysfunction.   98  . docusate sodium (COLACE) 100 MG capsule Take 300 mg by mouth 2 (two) times daily.    . Ferrous Sulfate (IRON SLOW RELEASE PO) Take by mouth daily.    . fexofenadine (ALLEGRA) 180 MG tablet Take 180 mg by mouth daily.    . halobetasol (ULTRAVATE) 0.05 % cream Apply 1 application topically daily as needed (hemorrhoids).     Marland Kitchen ketotifen (ZADITOR) 0.025 % ophthalmic solution Place 1 drop into both eyes 2 (two) times daily as needed.    . metoprolol tartrate (LOPRESSOR) 25 MG tablet Take 1 tablet (25  mg total) by mouth 2 (two) times daily. 180 tablet 3  . Multiple Vitamin (MULTIVITAMIN WITH MINERALS) TABS tablet Take 1 tablet by mouth at bedtime.    . nitroGLYCERIN (NITROSTAT) 0.4 MG SL tablet Place 1 tablet (0.4 mg total) under the tongue every 5 (five) minutes x 3 doses as needed for chest pain.    . pantoprazole (PROTONIX) 40 MG tablet Take 1 tablet (40 mg total) by mouth daily.    . Probiotic Product (PROBIOTIC DAILY PO) Take 1 capsule by mouth daily.     Carlena Hurl 20 MG TABS tablet TAKE 1 TABLET BY MOUTH EVERY DAY WITH SUPPER 90 tablet 1   No current facility-administered medications for this visit.    Allergies  Allergen Reactions  . Iodinated Diagnostic Agents Swelling  . Sulfa Antibiotics Swelling    Mouth swelling  . Aleve [Naproxen Sodium]     Doesn't remember  . Apple Swelling  . Banana Nausea And Vomiting  . Beef-Derived Products Nausea And Vomiting  . Erythromycin Swelling    Mouth swells  . Fish Allergy Swelling  . Garlic Swelling  . Sesame Seed (Diagnostic) Swelling  . Shellfish Allergy Other (See Comments)    Not sure of reaction found out about allergy through [facility]  . Sulfonamide Derivatives Swelling    Mouth swelling  . Tomato Nausea And Vomiting    welts  . Iodine Swelling    "I blow up like a balloon"  . Strawberry (Diagnostic) Rash  . Sweet Potato Other (See Comments)    Sweet and Regular Potatoes, GI symptoms    Social History   Socioeconomic History  . Marital status: Married    Spouse name: Not on file  . Number of children: Not on file  . Years of education: Not on file  . Highest education level: Not on file  Occupational History  . Not on file  Tobacco Use  . Smoking status: Former Smoker    Packs/day: 1.00    Years: 20.00    Pack years: 20.00    Types: Cigarettes    Quit date: 10/14/1988    Years since quitting: 32.3  . Smokeless tobacco: Never Used  . Tobacco comment: "quit smoking cigarettes in the 1980s"  Vaping Use  .  Vaping Use: Never used  Substance and Sexual Activity  . Alcohol use: Yes    Alcohol/week: 0.0 standard drinks    Comment: 02/27/2016 "nothing in the last few weeks; no regular pattern"  . Drug use: No  . Sexual activity: Yes  Other Topics Concern  . Not on file  Social History Narrative  . Not on file   Social Determinants of Health   Financial Resource Strain: Not on file  Food Insecurity: Not on file  Transportation Needs: Not on file  Physical Activity: Not on  file  Stress: Not on file  Social Connections: Not on file  Intimate Partner Violence: Not on file    Family History  Problem Relation Age of Onset  . Heart disease Mother   . Heart disease Father   . Colon cancer Neg Hx   . Esophageal cancer Neg Hx   . Rectal cancer Neg Hx   . Stomach cancer Neg Hx     Review of Systems:  As stated in the HPI and otherwise negative.   BP 126/68   Pulse 62   Ht 5\' 10"  (1.778 m)   Wt 179 lb 12.8 oz (81.6 kg)   SpO2 98%   BMI 25.80 kg/m   Physical Examination: General: Well developed, well nourished, NAD  HEENT: OP clear, mucus membranes moist  SKIN: warm, dry. No rashes. Neuro: No focal deficits  Musculoskeletal: Muscle strength 5/5 all ext  Psychiatric: Mood and affect normal  Neck: No JVD, no carotid bruits, no thyromegaly, no lymphadenopathy.  Lungs:Clear bilaterally, no wheezes, rhonci, crackles Cardiovascular: Regular rate and rhythm. No murmurs, gallops or rubs. Abdomen:Soft. Bowel sounds present. Non-tender.  Extremities: No lower extremity edema. Pulses are 2 + in the bilateral DP/PT.   Cardiac cath 02/28/16:  Prox RCA lesion, 30% stenosed.  Mid RCA to Dist RCA lesion, 40% stenosed.  Prox LAD to Mid LAD lesion, 30% stenosed.  1st Mrg lesion, 99% stenosed. Post intervention, there is a 0% residual stenosis.  The left ventricular systolic function is normal.   1. Severe (99% ) one-vessel coronary artery disease involving proximal OM1 which is very  large and supplies most of the left circumflex distribution. 2. Normal LV systolic function and mildly elevated left ventricular end-diastolic pressure. 3. Successful angioplasty and drug-eluting stent placement to OM 1.  Echo 01/15/17: - Left ventricle: The cavity size was normal. Wall thickness was   normal. Systolic function was normal. The estimated ejection   fraction was in the range of 60% to 65%. Wall motion was normal;   there were no regional wall motion abnormalities. Doppler   parameters are consistent with abnormal left ventricular   relaxation (grade 1 diastolic dysfunction). The E/e&' ratio is   between 8-15, suggesting indeterminate LV filling pressure. - Mitral valve: Mildly thickened leaflets . There was trivial   regurgitation. - Left atrium: The atrium was normal in size. - Atrial septum: No defect or patent foramen ovale was identified. - Tricuspid valve: There was mild regurgitation. - Pulmonary arteries: PA peak pressure: 38 mm Hg (S). - Inferior vena cava: The vessel was normal in size. The   respirophasic diameter changes were in the normal range (>= 50%),   consistent with normal central venous pressure.  Impressions:  - LVEF 60-65%, normal wall thickness, normal wall motion, grade 1   DD with indeterminate LV filling pressure, normal LA size, upper   normal RA size, trivial MR, mild TR, RVSP 38 mmHg, normal IVC.  EKG:  EKG is ordered today. The ekg ordered today demonstrates sinus  Recent Labs: 07/21/2020: BUN 14; Creatinine, Ser 0.90; Potassium 4.0; Sodium 140 12/14/2020: Hemoglobin 14.1; Platelets 273.0   Lipid Panel Followed in primary care   Wt Readings from Last 3 Encounters:  02/12/21 179 lb 12.8 oz (81.6 kg)  12/20/20 181 lb (82.1 kg)  12/14/20 181 lb (82.1 kg)     Other studies Reviewed: Additional studies/ records that were reviewed today include: . Review of the above records demonstrates:   Assessment and Plan:   1.  CAD without  angina: He had a drug eluting stent placed in the obtuse marginal branch of the Circumflex in May 2017. Normal LV function by echo April 2018. He has no chest pain. Will continue ASA, statin and beta blocker.      2. HLD: Lipids followed in primary care. LDL 63 in November 2021. Continue statin  3. Atrial fibrillation, paroxysmal: Sinus today. Continue beta blocker and Xarelto.   Current medicines are reviewed at length with the patient today.  The patient does not have concerns regarding medicines.  The following changes have been made:  no change  Labs/ tests ordered today include:   Orders Placed This Encounter  Procedures  . EKG 12-Lead    Disposition:   F/U with me in 12 months  Signed, Verne Carrowhristopher Brighton Delio, MD 02/12/2021 10:15 AM    [facility] [address], [place], [place]  [address] Phone: [phone]) [phone]; Fax: [phone]

## 2021-02-12 NOTE — Patient Instructions (Signed)
Medication Instructions:  Your physician recommends that you continue on your current medications as directed. Please refer to the Current Medication list given to you today.  *If you need a refill on your cardiac medications before your next appointment, please call your pharmacy*   Lab Work: None If you have labs (blood work) drawn today and your tests are completely normal, you will receive your results only by: . MyChart Message (if you have MyChart) OR . A paper copy in the mail If you have any lab test that is abnormal or we need to change your treatment, we will call you to review the results.   Testing/Procedures: None   Follow-Up: At [facility], you and your health needs are our priority.  As part of our continuing mission to provide you with exceptional heart care, we have created designated Provider Care Teams.  These Care Teams include your primary Cardiologist (physician) and Advanced Practice Providers (APPs -  Physician Assistants and Nurse Practitioners) who all work together to provide you with the care you need, when you need it.  We recommend signing up for the patient portal called "MyChart".  Sign up information is provided on this After Visit Summary.  MyChart is used to connect with patients for Virtual Visits (Telemedicine).  Patients are able to view lab/test results, encounter notes, upcoming appointments, etc.  Non-urgent messages can be sent to your provider as well.   To learn more about what you can do with MyChart, go to [email or web address].    Your next appointment:   1 year(s)  The format for your next appointment:   In Person  Provider:   You may see Christopher McAlhany, MD or one of the following Advanced Practice Providers on your designated Care Team:    Dayna Dunn, PA-C  Michele Lenze, PA-C    Other Instructions   

## 2021-02-13 ENCOUNTER — Other Ambulatory Visit: Payer: Self-pay | Admitting: Cardiovascular Disease

## 2021-03-23 ENCOUNTER — Other Ambulatory Visit: Payer: Self-pay | Admitting: Cardiovascular Disease

## 2021-03-23 NOTE — Telephone Encounter (Signed)
Xarelto mg refill request received. Pt is 70 years old, weight-81.6kg, Crea-0.90 on 07/21/2020, last seen by Dr. Clifton James on 02/12/2021, Diagnosis-Afib, CrCl-86.55ml/min; Dose is appropriate based on dosing criteria. Will send in refill to requested pharmacy.

## 2021-03-27 ENCOUNTER — Other Ambulatory Visit (HOSPITAL_COMMUNITY): Payer: Self-pay | Admitting: *Deleted

## 2021-03-30 ENCOUNTER — Other Ambulatory Visit: Payer: Self-pay

## 2021-03-30 ENCOUNTER — Encounter (HOSPITAL_COMMUNITY)
Admission: RE | Admit: 2021-03-30 | Discharge: 2021-03-30 | Disposition: A | Discharge status: Home / Self Care | Payer: Medicare Other | Source: Ambulatory Visit | Attending: Internal Medicine | Admitting: Internal Medicine

## 2021-03-30 DIAGNOSIS — D649 Anemia, unspecified: Secondary | ICD-10-CM | POA: Insufficient documentation

## 2021-03-30 MED ORDER — SODIUM CHLORIDE 0.9 % IV SOLN
510.0000 mg | INTRAVENOUS | Status: DC
  Administered 2021-03-30: 510 mg via INTRAVENOUS
  Filled 2021-03-30: qty 510

## 2021-04-06 ENCOUNTER — Other Ambulatory Visit: Payer: Self-pay

## 2021-04-06 ENCOUNTER — Encounter (HOSPITAL_COMMUNITY)
Admission: RE | Admit: 2021-04-06 | Discharge: 2021-04-06 | Disposition: A | Discharge status: Home / Self Care | Payer: Medicare Other | Source: Ambulatory Visit | Attending: Internal Medicine | Admitting: Internal Medicine

## 2021-04-06 DIAGNOSIS — D649 Anemia, unspecified: Secondary | ICD-10-CM | POA: Diagnosis not present

## 2021-04-06 MED ORDER — SODIUM CHLORIDE 0.9 % IV SOLN
510.0000 mg | INTRAVENOUS | Status: AC
  Administered 2021-04-06: 510 mg via INTRAVENOUS
  Filled 2021-04-06: qty 510

## 2021-05-14 NOTE — Telephone Encounter (Signed)
Not sure there is any reason to suspect that the booster shot would lead to his hemoptysis. I do want him to monitor it and let us know if it doesn;t resolve.

## 2021-05-14 NOTE — Telephone Encounter (Signed)
Called and spoke with patient. He stated that he had received his 2nd covid booster on Saturday (05/12/21) and Sunday he coughed up a small amount of blood. Prior to this event, it had been a few months since he has coughed up blood. He could not tell me if it was bright red or dark red blood. Denied any SOB, chest pain, sore throat or wheezing. He also coughed up a small amount last night as well. So far he has not coughed up any blood today. He stated that he feels completely fine.   He just wanted to know if it was possible that the hemoptysis could have come from [employer] covid vaccine.   RB, can you please advise? Thanks!

## 2021-07-25 NOTE — Telephone Encounter (Signed)
Please let him know that I went back through his case, and I reviewed all of DR Rosen's findings.  I don't see any new obvious testing to perform. I agree with him that he might benefit from a second opinion. I am happy to refer him to a tertiary center if he would like > either [place], [place] or [facility]. Have him let us know.

## 2021-07-25 NOTE — Telephone Encounter (Signed)
RB please advise.  I called the pt this morning and he is not really sure what else needs to be done.  He is concerned as this keeps happening and he stated that he has had all kinds of testing, xrays, scan, biopsies, labs and he stated that nothing has been found.  He said that this has been going on at least 3 years and this happens several times per year and last happened about 3-4 months ago.  He stated that the last time this happened he was told we would wait and see.  He is discouraged.  He is still taking the Xarelto.  He has questions:   He wanted to know if doctors discuss cases that they cannot figure out with other colleagues?  Can RB or will RB discuss his case with someone else to see if we are missing something? Does RB feel that it is time for the pt to be sent to a new specialist?    He is just at wits end and is not sure if this is cancer and it is getting missed or if something else is going on that just has not been found yet.  RB please advise. thanks

## 2021-07-26 NOTE — Telephone Encounter (Signed)
Spoke with pt and reviewed Dr. Kavin Leech recommendations. Pt stated he would like to research [place], [place] and [employer] and then let us know where he would like [place] to place referral. Nothing further needed at this time.

## 2021-09-02 IMAGING — CT CT ANGIO CHEST
2 of 7 series · 19 of 46 positions shown · IV contrast (APPLIED)
Comparison: June 07, 2020

CLINICAL DATA: Coughing up blood last night. Assess for pulmonary
embolus.

EXAM:
CT ANGIOGRAPHY CHEST WITH CONTRAST
TECHNIQUE: Multidetector CT imaging of the chest was performed using the
standard protocol during bolus administration of intravenous
contrast. Multiplanar CT image reconstructions and MIPs were
obtained to evaluate the vascular anatomy.
CONTRAST:  75mL OMNIPAQUE IOHEXOL 350 MG/ML SOLN

[Series 6: thins · axial · 0.79mm/px · z∈[+1071,+1380]mm · 16 of 497 slices shown]
[im 28/497  lung]
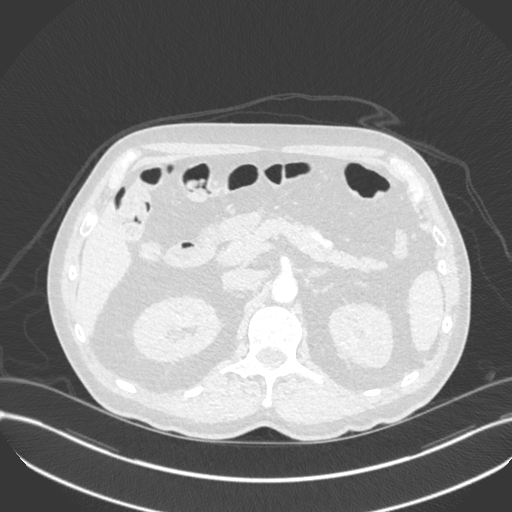
[im 56/497  soft-tissue]
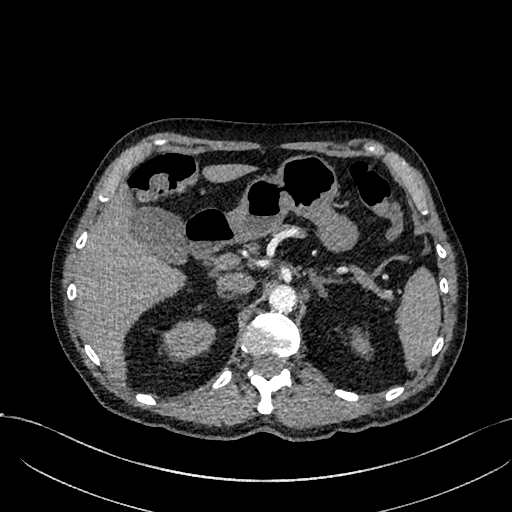
[im 83/497  lung]
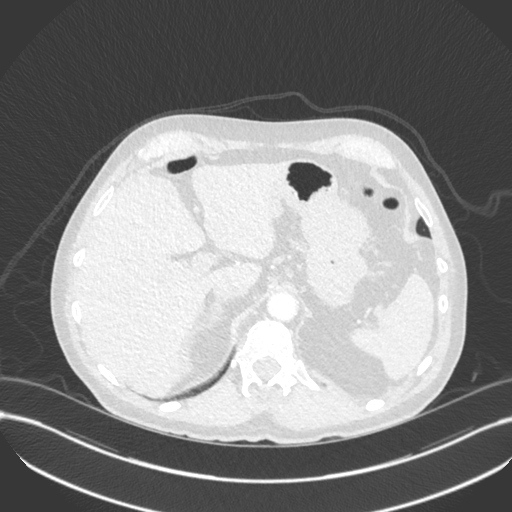
[im 111/497  soft-tissue]
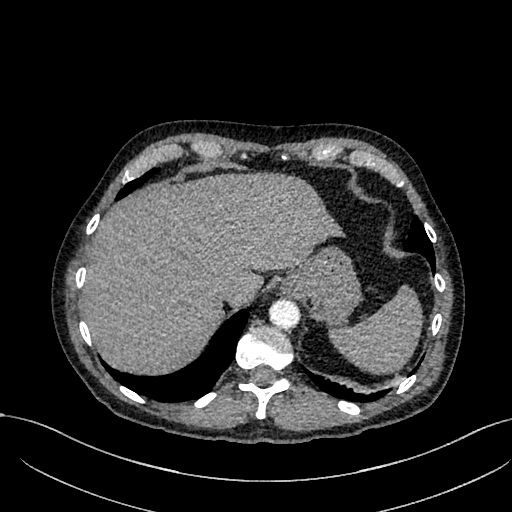
[im 138/497  lung]
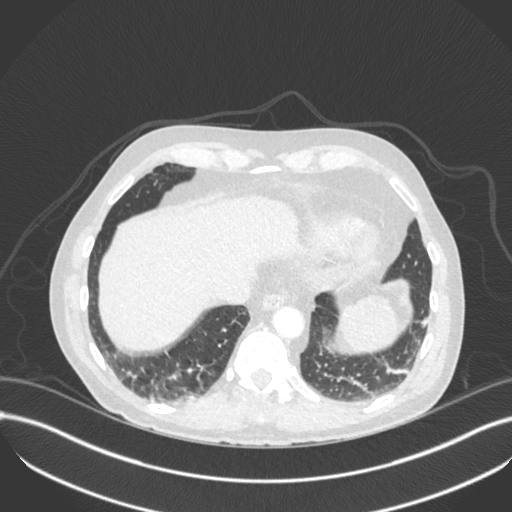
[im 166/497  soft-tissue]
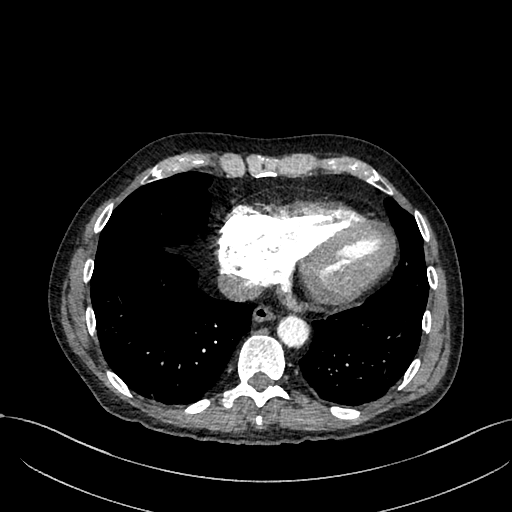
[im 193/497  lung]
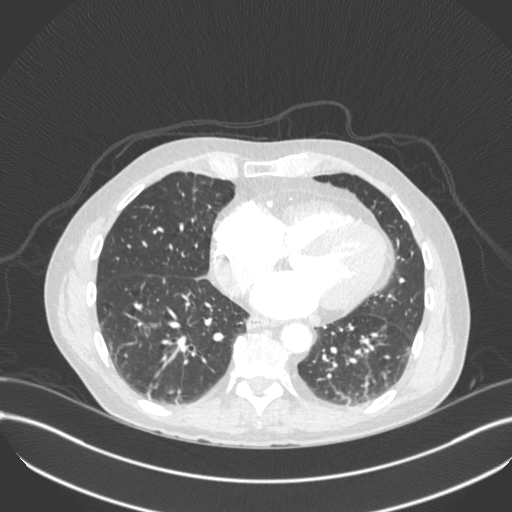
[im 221/497  soft-tissue]
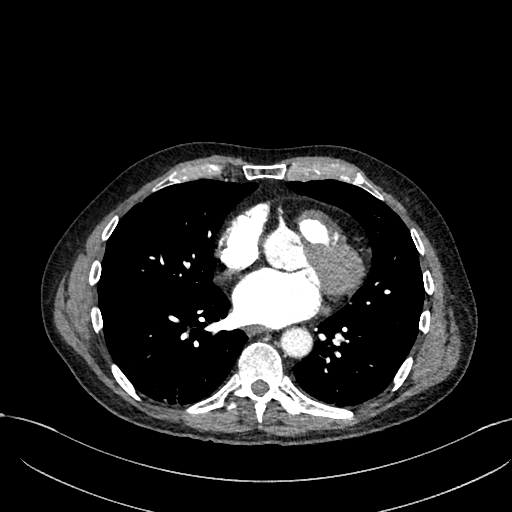
[im 276/497  lung]
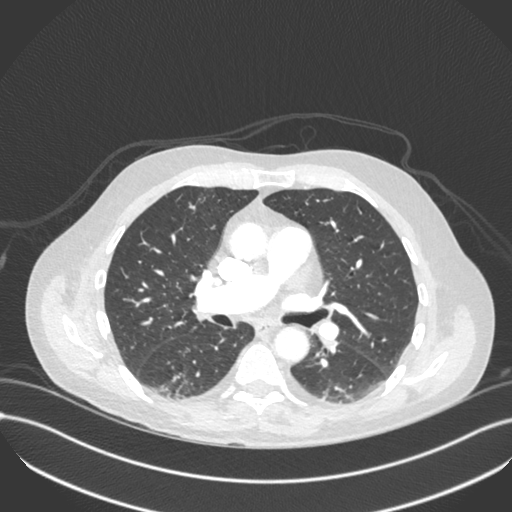
[im 304/497  soft-tissue]
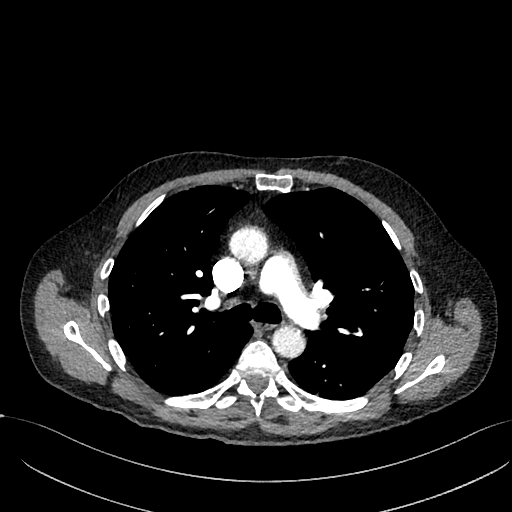
[im 331/497  lung]
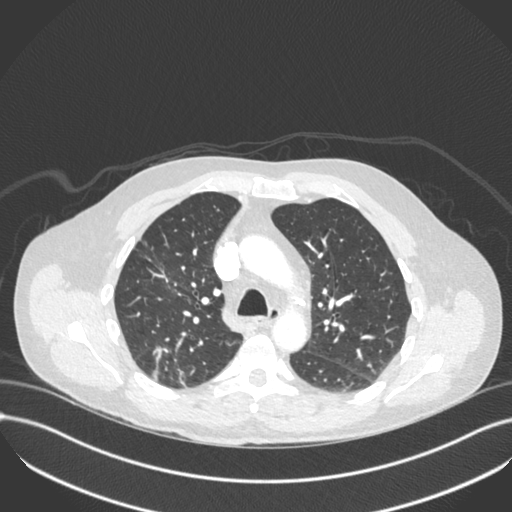
[im 359/497  soft-tissue]
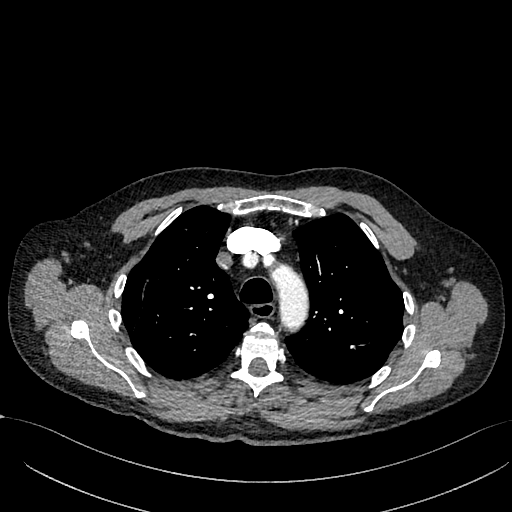
[im 386/497  lung]
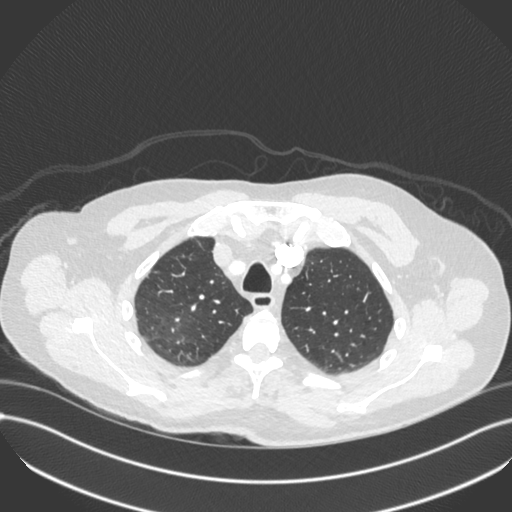
[im 414/497  soft-tissue]
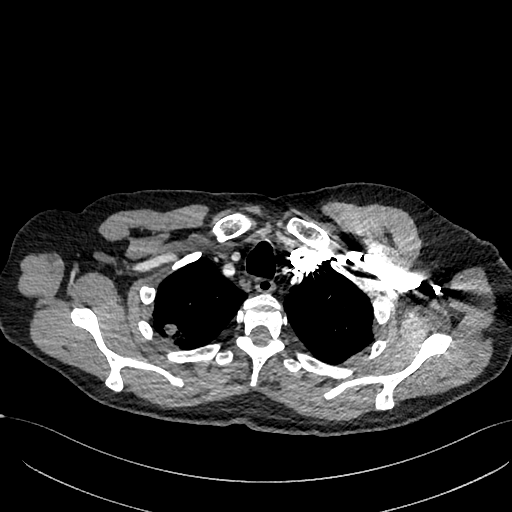
[im 441/497  lung]
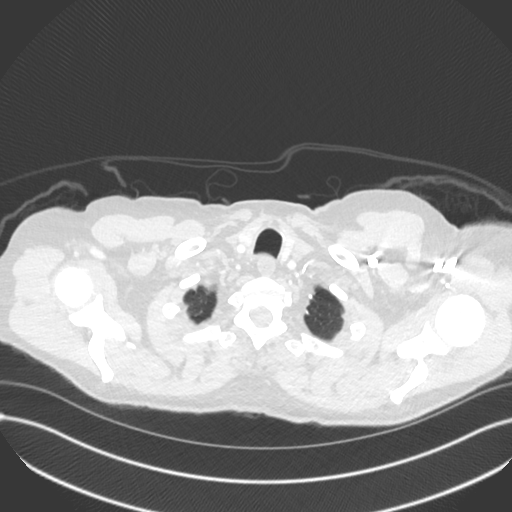
[im 469/497  soft-tissue]
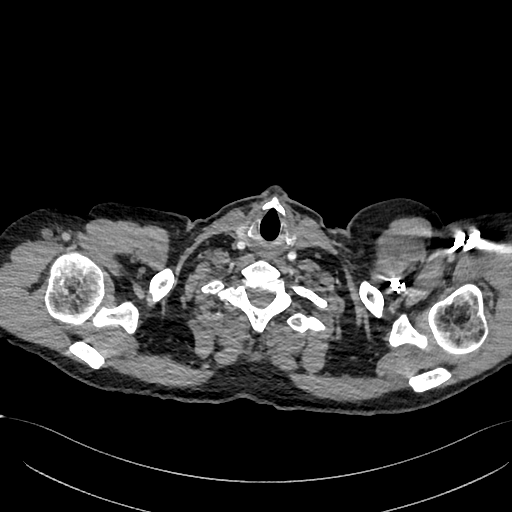

[Series 8: cor · coronal · 0.75mm/px · 3 of 141 slices shown]
[im 36/141  soft-tissue]
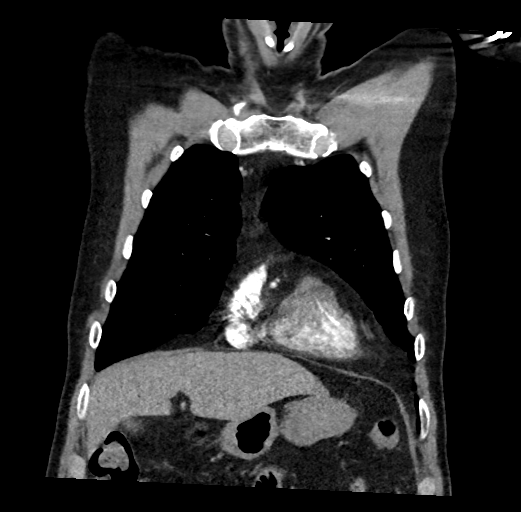
[im 71/141  soft-tissue]
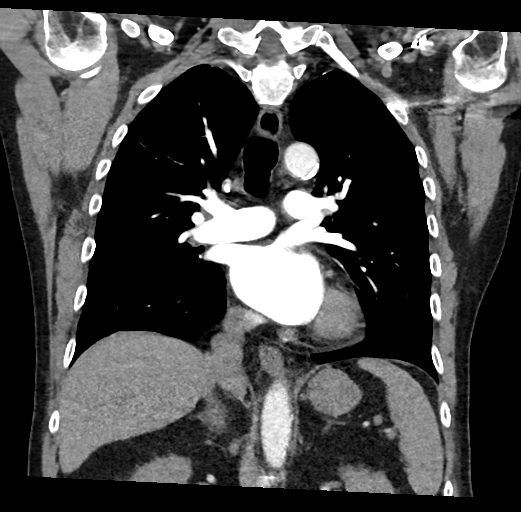
[im 106/141  soft-tissue]
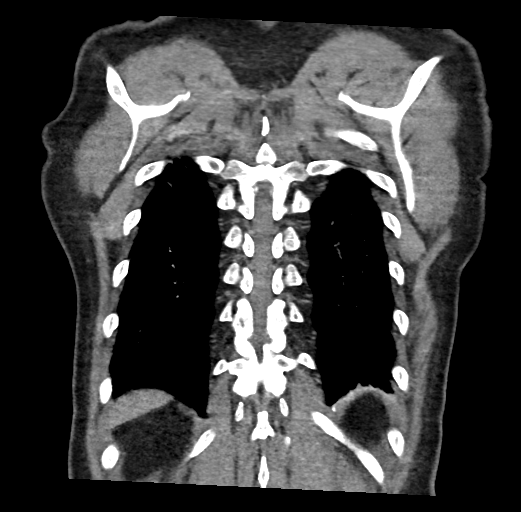

[19 of 46 positions shown; findings below may reference images not displayed]

FINDINGS: Cardiovascular: Satisfactory opacification of the pulmonary arteries
to the segmental level. No evidence of pulmonary embolism. Normal
heart size. No pericardial effusion.

Mediastinum/Nodes: No enlarged mediastinal, hilar, or axillary lymph
nodes. Thyroid gland, trachea, and esophagus demonstrate no
significant findings.

Lungs/Pleura: Patchy consolidation of the right upper lobe in the
apex is noted. Atelectasis of posterior left lung base is noted.
There is no pleural effusion.

Upper Abdomen: No acute abnormality.

Musculoskeletal: Mild degenerative joint changes of the spine are
noted.

Review of the MIP images confirms the above findings.
IMPRESSION: 1. No pulmonary embolus.
2. Patchy consolidation of the right upper lobe in the apex,
suspicious for pneumonia.

## 2021-11-02 ENCOUNTER — Other Ambulatory Visit: Payer: Self-pay | Admitting: Cardiovascular Disease

## 2021-11-02 NOTE — Telephone Encounter (Signed)
Prescription refill request for Xarelto received.  Indication: afib  Last office visit: [place], 02/12/2021 Weight: 81.6 kg  Age: 71 yo  Scr: 0.9, via [facility] 07/21/2020 CrCl: 85.63 ml/min    Overdue for blood work

## 2021-11-02 NOTE — Telephone Encounter (Signed)
Xarelto 20mg  refill request received. Pt is 71 years old, weight-81.6kg, Crea-0.80 on 03/06/2021 via scanned labs from [facility], last seen by [facility] on 02/12/2021, Diagnosis-Afib, CrCl-96.88ml/min; Dose is appropriate based on dosing criteria. Will send in refill to requested pharmacy.

## 2021-11-02 NOTE — Telephone Encounter (Signed)
Called pt who stated that he had blood work last month or two at his PCP, Dr. Francesco Sor.   Called his PCP and LMOM.

## 2021-11-08 ENCOUNTER — Encounter: Payer: Self-pay | Admitting: Cardiovascular Disease

## 2021-12-11 ENCOUNTER — Encounter: Payer: Self-pay | Admitting: Cardiovascular Disease

## 2022-01-17 ENCOUNTER — Other Ambulatory Visit: Payer: Self-pay

## 2022-01-17 ENCOUNTER — Encounter (HOSPITAL_COMMUNITY): Payer: Self-pay | Admitting: Emergency Medicine

## 2022-01-17 ENCOUNTER — Ambulatory Visit (HOSPITAL_COMMUNITY)
Admission: EM | Admit: 2022-01-17 | Discharge: 2022-01-17 | Disposition: A | Discharge status: Home / Self Care | Payer: Medicare Other | Attending: Family Medicine | Admitting: Family Medicine

## 2022-01-17 ENCOUNTER — Ambulatory Visit (INDEPENDENT_AMBULATORY_CARE_PROVIDER_SITE_OTHER): Payer: Medicare Other

## 2022-01-17 DIAGNOSIS — R051 Acute cough: Secondary | ICD-10-CM | POA: Insufficient documentation

## 2022-01-17 DIAGNOSIS — Z20822 Contact with and (suspected) exposure to covid-19: Secondary | ICD-10-CM | POA: Diagnosis not present

## 2022-01-17 DIAGNOSIS — R062 Wheezing: Secondary | ICD-10-CM

## 2022-01-17 LAB — SARS CORONAVIRUS 2 (TAT 6-24 HRS): SARS Coronavirus 2: NEGATIVE

## 2022-01-17 MED ORDER — TRIAMCINOLONE ACETONIDE 40 MG/ML IJ SUSP
40.0000 mg | Freq: Once | INTRAMUSCULAR | Status: AC
  Administered 2022-01-17: 40 mg via INTRAMUSCULAR

## 2022-01-17 MED ORDER — TRIAMCINOLONE ACETONIDE 40 MG/ML IJ SUSP
INTRAMUSCULAR | Status: AC
  Filled 2022-01-17: qty 1

## 2022-01-17 NOTE — ED Provider Notes (Signed)
?[place] ? ? ? ?CSN: [id number] ?Arrival date & time: 01/17/22  0825 ? ? ?  ? ?History   ?Chief Complaint ?Chief Complaint  ?Patient presents with  ? Cough  ? ? ?HPI ?Travis Jacobs is a 71 y.o. male.  ? ? ?Cough ?Here for a 1 week history of some increasing congestion.  He feels it is mostly due to the pollen, and he also cleaned out a dusty garage right before this started getting worse.  He is coughing a good bit and the congestion is increased.  Last night he wheezed overnight and felt a little short of breath then.  He did do an home COVID test that was negative ? ?No fever or chills. ? ?He states he does not have a history of asthma ? ?Past Medical History:  ?Diagnosis Date  ? A-fib ([place])   ? CAD (coronary artery disease)   ? a. DES to [facility] 02/2016.   ? Cataract   ? bil cateracts  ? Diverticulosis   ? Esophageal stricture   ? GERD (gastroesophageal reflux disease)   ? Hemorrhoids   ? Hiatal hernia   ? Hyperlipidemia   ? IBS (irritable bowel syndrome)   ? Multiple food allergies   ? "lots of food allergies; beef, tuna, flounder, potatoes, garlic, tomatoes, apples, bananas, sesame seeds, shellfish  ? Pre-diabetes   ? PVC's (premature ventricular contractions)   ? ? ?Patient Active Problem List  ? Diagnosis Date Noted  ? Dyspnea 07/03/2020  ? Hemoptysis 12/30/2019  ? Pulmonary nodules/lesions, multiple 12/30/2019  ? Pre-diabetes   ? PVC's (premature ventricular contractions)   ? Hyperlipidemia   ? CAD- s/p PCI +DES to [facility] 02/28/16 02/29/2016  ? Effort angina (HCC)   ? Abnormal stress test 02/27/2016  ? Ventricular bigeminy 02/27/2016  ? INTESTINAL GAS 08/09/2008  ? ANXIETY DEPRESSION 08/04/2008  ? HEMORRHOIDS, EXTERNAL 08/04/2008  ? GERD 08/04/2008  ? HIATAL HERNIA 08/04/2008  ? DIVERTICULOSIS, COLON 08/04/2008  ? IBS 08/04/2008  ? ? ?Past Surgical History:  ?Procedure Laterality Date  ? BRONCHIAL WASHINGS  01/11/2020  ? Procedure: BRONCHIAL WASHINGS;  Surgeon: Collene Gobble, MD;  Location: [facility]  ENDOSCOPY;  Service: Cardiopulmonary;;  ? CARDIAC CATHETERIZATION N/A 02/28/2016  ? Procedure: Left Heart Cath and Coronary Angiography;  Surgeon: Wellington Hampshire, MD;  Location: [place] CV LAB;  Service: Cardiovascular;  Laterality: N/A;  ? CARDIAC CATHETERIZATION N/A 02/28/2016  ? Procedure: Coronary Stent Intervention;  Surgeon: Wellington Hampshire, MD;  Location: [place] CV LAB;  Service: Cardiovascular;  Laterality: N/A;  ? COLONOSCOPY    ? CYSTOSCOPY WITH STENT PLACEMENT    ? ESOPHAGOGASTRODUODENOSCOPY (EGD) WITH ESOPHAGEAL DILATION    ? HEMORRHOID BANDING    ? HERNIA REPAIR    ? SURGERY SCROTAL / TESTICULAR Left ~ 1960  ? "hernia"  ? TONSILLECTOMY    ? UPPER GASTROINTESTINAL ENDOSCOPY    ? VIDEO BRONCHOSCOPY N/A 01/11/2020  ? Procedure: VIDEO BRONCHOSCOPY WITHOUT FLUORO;  Surgeon: Collene Gobble, MD;  Location[facility] ENDOSCOPY;  Service: Cardiopulmonary;  Laterality: N/A;  ? ? ? ? ? ?Home Medications   ? ?Prior to Admission medications   ?Medication Sig Start Date End Date Taking? Authorizing Provider  ?acetaminophen (TYLENOL) 500 MG tablet Take 500 mg by mouth every 8 (eight) hours as needed for moderate pain or headache.    [provider]  ?Apoaequorin (PREVAGEN EXTRA STRENGTH) 20 MG CAPS Take 20 mg by mouth daily.    [provider]  ?aspirin EC 81 MG tablet Take 1 tablet (81 mg total) by mouth daily. 10/23/17   Burnell Blanks, MD  ?atorvastatin (LIPITOR) 40 MG tablet Take 1 tablet (40 mg total) by mouth every evening. 01/11/20   Collene Gobble, MD  ?CIALIS 5 MG tablet Take 5 mg by mouth daily as needed for erectile dysfunction.  02/27/15   [provider]  ?docusate sodium (COLACE) 100 MG capsule Take 300 mg by mouth 2 (two) times daily.    [provider]  ?Ferrous Sulfate (IRON SLOW RELEASE PO) Take by mouth daily.    [provider]  ?fexofenadine (ALLEGRA) 180 MG tablet Take 180 mg by mouth daily. ?Patient not taking: Reported on 01/17/2022     [provider]  ?halobetasol (ULTRAVATE) 0.05 % cream Apply 1 application topically daily as needed (hemorrhoids).     [provider]  ?ketotifen (ZADITOR) 0.025 % ophthalmic solution Place 1 drop into both eyes 2 (two) times daily as needed. ?Patient not taking: Reported on 01/17/2022    [provider]  ?metoprolol tartrate (LOPRESSOR) 25 MG tablet TAKE 1 TABLET BY MOUTH TWICE A DAY 02/13/21   Burnell Blanks, MD  ?Multiple Vitamin (MULTIVITAMIN WITH MINERALS) TABS tablet Take 1 tablet by mouth at bedtime.    [provider]  ?nitroGLYCERIN (NITROSTAT) 0.4 MG SL tablet Place 1 tablet (0.4 mg total) under the tongue every 5 (five) minutes x 3 doses as needed for chest pain. 01/11/20   Collene Gobble, MD  ?pantoprazole (PROTONIX) 40 MG tablet Take 1 tablet (40 mg total) by mouth daily. 01/11/20   Collene Gobble, MD  ?Probiotic Product (PROBIOTIC DAILY PO) Take 1 capsule by mouth daily.     [provider]  ?XARELTO 20 MG TABS tablet TAKE 1 TABLET BY MOUTH EVERY DAY WITH SUPPER 11/02/21   Burnell Blanks, MD  ? ? ?Family History ?Family History  ?Problem Relation Age of Onset  ? Heart disease Mother   ? Heart disease Father   ? Colon cancer Neg Hx   ? Esophageal cancer Neg Hx   ? Rectal cancer Neg Hx   ? Stomach cancer Neg Hx   ? ? ?Social History ?Social History  ? ?Tobacco Use  ? Smoking status: Former  ?  Packs/day: 1.00  ?  Years: 20.00  ?  Pack years: 20.00  ?  Types: Cigarettes  ?  Quit date: 10/14/1988  ?  Years since quitting: 33.2  ? Smokeless tobacco: Never  ? Tobacco comments:  ?  "quit smoking cigarettes in the 1980s"  ?Vaping Use  ? Vaping Use: Never used  ?Substance Use Topics  ? Alcohol use: Yes  ?  Alcohol/week: 0.0 standard drinks  ?  Comment: 02/27/2016 "nothing in the last few weeks; no regular pattern"  ? Drug use: No  ? ? ? ?Allergies   ?Iodinated contrast media, Sulfa antibiotics, Aleve [naproxen sodium], Apple juice, Banana, Beef-derived  products, Erythromycin, Fish allergy, Garlic, Sesame seed (diagnostic), Shellfish allergy, Sulfonamide derivatives, Tomato, Iodine, Strawberry (diagnostic), and Sweet potato ? ? ?Review of Systems ?Review of Systems  ?Respiratory:  Positive for cough.   ? ? ?Physical Exam ?Triage Vital Signs ?ED Triage Vitals  ?Enc Vitals Group  ?   BP 01/17/22 0851 (!) 145/88  ?   Pulse Rate 01/17/22 0851 72  ?   Resp 01/17/22 0851 20  ?   Temp 01/17/22 0851 98.3 ?F (36.8 ?C)  ?  Temp Source 01/17/22 0851 Oral  ?   SpO2 01/17/22 0851 95 %  ?   Weight --   ?   Height --   ?   Head Circumference --   ?   Peak Flow --   ?   Pain Score 01/17/22 0845 0  ?   Pain Loc --   ?   Pain Edu? --   ?   Excl. in [place]? --   ? ?No data found. ? ?Updated Vital Signs ?BP (!) 145/88 (BP Location: Right Arm)   Pulse 72   Temp 98.3 ?F (36.8 ?C) (Oral)   Resp 20   SpO2 95%  ? ?Visual Acuity ?Right Eye Distance:   ?Left Eye Distance:   ?Bilateral Distance:   ? ?Right Eye Near:   ?Left Eye Near:    ?Bilateral Near:    ? ?Physical Exam ?Vitals reviewed.  ?Constitutional:   ?   General: He is not in acute distress. ?   Appearance: He is not toxic-appearing.  ?HENT:  ?   Right Ear: Tympanic membrane and ear canal normal.  ?   Left Ear: Tympanic membrane and ear canal normal.  ?   Nose: Congestion present.  ?   Mouth/Throat:  ?   Mouth: Mucous membranes are moist.  ?   Pharynx: No oropharyngeal exudate or posterior oropharyngeal erythema.  ?Eyes:  ?   Extraocular Movements: Extraocular movements intact.  ?   Conjunctiva/sclera: Conjunctivae normal.  ?   Pupils: Pupils are equal, round, and reactive to light.  ?Cardiovascular:  ?   Rate and Rhythm: Normal rate and regular rhythm.  ?   Heart sounds: No murmur heard. ?Pulmonary:  ?   Effort: Pulmonary effort is normal.  ?   Breath sounds: No stridor. No wheezing or rhonchi.  ?   Comments: There are a couple of crackles in the lower bases bilaterally ?Musculoskeletal:  ?   Cervical back: Neck supple.   ?Lymphadenopathy:  ?   Cervical: No cervical adenopathy.  ?Skin: ?   Capillary Refill: Capillary refill takes less than 2 seconds.  ?   Coloration: Skin is not jaundiced or pale.  ?Neurological:  ?   General: No focal deficit p

## 2022-01-17 NOTE — Discharge Instructions (Addendum)
You have been given a shot of triamcinolone 40 mg in the office ? ?You can take Claritin/loratadine, Allegra/fexofenadine, or Zyrtec/ cetirizine over-the-counter daily for allergies ? ?Also you can use Flonase or its generic nose spray--2 sprays each nostril once daily ? ?You have been swabbed for COVID, and the test will result in the next 24 hours. Our staff will call you if positive. If the test is positive, you should quarantine for 5 days.  ?

## 2022-01-17 NOTE — ED Triage Notes (Addendum)
Cough and sore throat.  Symptoms started a week ago.  Patient associates symptoms with the pollen ? ? ?Patient reports voice is deeper than usual.  Reports a "deep cough" and thinks he has had wheezing.  Patient reports a history of hemoptysis in his history and is concerned for bronchitis and sinusitis.   ?

## 2022-01-17 NOTE — ED Notes (Signed)
Patient questioned delay.  Another provider will be assuming responsibility for this patient ?

## 2022-01-25 ENCOUNTER — Ambulatory Visit (HOSPITAL_COMMUNITY)
Admission: EM | Admit: 2022-01-25 | Discharge: 2022-01-25 | Disposition: A | Discharge status: Home / Self Care | Payer: Medicare Other | Attending: Family Medicine | Admitting: Family Medicine

## 2022-01-25 ENCOUNTER — Encounter (HOSPITAL_COMMUNITY): Payer: Self-pay | Admitting: Emergency Medicine

## 2022-01-25 DIAGNOSIS — R052 Subacute cough: Secondary | ICD-10-CM | POA: Diagnosis not present

## 2022-01-25 MED ORDER — BENZONATATE 200 MG PO CAPS: 200.0000 mg | ORAL_CAPSULE | Freq: Three times a day (TID) | ORAL | 0 refills | Status: DC

## 2022-01-25 NOTE — ED Provider Notes (Signed)
?[facility] ? ? ? ?CSN: [id number] ?Arrival date & time: 01/25/22  0816 ? ? ?  ? ?History   ?Chief Complaint ?Chief Complaint  ?Patient presents with  ? Cough  ? ? ?HPI ?Travis Jacobs is a 71 y.o. male.  ? ?Patient is here for cough.  ?He was seen last week for cough, allergies.  He was given a steroid shot, and recommended otc allergy medications.  ?He conts with a cough, although better.  The cough is still there, deep.  He has never had this cough before.  ?He does not believe he has wheezing per se, but he feels "gurgling" in his lungs.  ?He has surgery next week and wants to make sure is okay.  ?He is currently taking tussion or robittusin ?No fevers.  ?No runny nose, but slight congestion.   ? ?Past Medical History:  ?Diagnosis Date  ? A-fib (HCC)   ? CAD (coronary artery disease)   ? a. DES to [facility] 02/2016.   ? Cataract   ? bil cateracts  ? Diverticulosis   ? Esophageal stricture   ? GERD (gastroesophageal reflux disease)   ? Hemorrhoids   ? Hiatal hernia   ? Hyperlipidemia   ? IBS (irritable bowel syndrome)   ? Multiple food allergies   ? "lots of food allergies; beef, tuna, flounder, potatoes, garlic, tomatoes, apples, bananas, sesame seeds, shellfish  ? Pre-diabetes   ? PVC's (premature ventricular contractions)   ? ? ?Patient Active Problem List  ? Diagnosis Date Noted  ? Dyspnea 07/03/2020  ? Hemoptysis 12/30/2019  ? Pulmonary nodules/lesions, multiple 12/30/2019  ? Pre-diabetes   ? PVC's (premature ventricular contractions)   ? Hyperlipidemia   ? [facility] 02/29/2016  ? Effort angina (HCC)   ? Abnormal stress test 02/27/2016  ? Ventricular bigeminy 02/27/2016  ? INTESTINAL GAS 08/09/2008  ? ANXIETY DEPRESSION 08/04/2008  ? HEMORRHOIDS, EXTERNAL 08/04/2008  ? GERD 08/04/2008  ? HIATAL HERNIA 08/04/2008  ? DIVERTICULOSIS, COLON 08/04/2008  ? IBS 08/04/2008  ? ? ?Past Surgical History:  ?Procedure Laterality Date  ? BRONCHIAL WASHINGS  01/11/2020  ? Procedure: BRONCHIAL  WASHINGS;  Surgeon: Leslye PeerByrum, Robert S, MD;  [facility];  Service: Cardiopulmonary;;  ? CARDIAC CATHETERIZATION N/A 02/28/2016  ? Procedure: Left Heart Cath and Coronary Angiography;  Surgeon: Iran OuchMuhammad A Arida, MD;  [facility]: [facility] IN[facility]AS[facility] LAB;  Service: Cardiovascular;  Laterality: N/A;  ? CARDIAC CATHETERIZATION N/A 02/28/2016  ? Procedure: Coronary Stent Intervention;  Surgeon: Iran OuchMuhammad A Arida, MD;  [facility]: [facility];  Service: Cardiovascular;  Laterality: N/A;  ? COLONOSCOPY    ? CYSTOSCOPY WITH STENT PLACEMENT    ? ESOPHAGOGASTRODUODENOSCOPY (EGD) WITH ESOPHAGEAL DILATION    ? HEMORRHOID BANDING    ? HERNIA REPAIR    ? SURGERY SCROTAL / TESTICULAR Left ~ 1960  ? "hernia"  ? TONSILLECTOMY    ? UPPER GASTROINTESTINAL ENDOSCOPY    ? VIDEO BRONCHOSCOPY N/A 01/11/2020  ? Procedure: VIDEO BRONCHOSCOPY WITHOUT FLUORO;  Surgeon: Leslye PeerByrum, Robert S, MD;  [facility];  Service: Cardiopulmonary;  Laterality: N/A;  ? ? ? ? ? ?Home Medications   ? ?Prior to Admission medications   ?Medication Sig Start Date End Date Taking? Authorizing Provider  ?acetaminophen (TYLENOL) 500 MG tablet Take 500 mg by mouth every 8 (eight) hours as needed for moderate pain or headache.    [provider]  ?Apoaequorin (PREVAGEN EXTRA STRENGTH) 20 MG CAPS Take  20 mg by mouth daily.    [provider]  ?aspirin EC 81 MG tablet Take 1 tablet (81 mg total) by mouth daily. 10/23/17   Kathleene Hazel, MD  ?atorvastatin (LIPITOR) 40 MG tablet Take 1 tablet (40 mg total) by mouth every evening. 01/11/20   Leslye Peer, MD  ?CIALIS 5 MG tablet Take 5 mg by mouth daily as needed for erectile dysfunction.  02/27/15   [provider]  ?docusate sodium (COLACE) 100 MG capsule Take 300 mg by mouth 2 (two) times daily.    [provider]  ?Ferrous Sulfate (IRON SLOW RELEASE PO) Take by mouth daily.    [provider]  ?fexofenadine (ALLEGRA) 180 MG tablet Take 180 mg by mouth  daily. ?Patient not taking: Reported on 01/17/2022    [provider]  ?halobetasol (ULTRAVATE) 0.05 % cream Apply 1 application topically daily as needed (hemorrhoids).     [provider]  ?ketotifen (ZADITOR) 0.025 % ophthalmic solution Place 1 drop into both eyes 2 (two) times daily as needed. ?Patient not taking: Reported on 01/17/2022    [provider]  ?metoprolol tartrate (LOPRESSOR) 25 MG tablet TAKE 1 TABLET BY MOUTH TWICE A DAY 02/13/21   Kathleene Hazel, MD  ?Multiple Vitamin (MULTIVITAMIN WITH MINERALS) TABS tablet Take 1 tablet by mouth at bedtime.    [provider]  ?nitroGLYCERIN (NITROSTAT) 0.4 MG SL tablet Place 1 tablet (0.4 mg total) under the tongue every 5 (five) minutes x 3 doses as needed for chest pain. 01/11/20   Leslye Peer, MD  ?pantoprazole (PROTONIX) 40 MG tablet Take 1 tablet (40 mg total) by mouth daily. 01/11/20   Leslye Peer, MD  ?Probiotic Product (PROBIOTIC DAILY PO) Take 1 capsule by mouth daily.     [provider]  ?XARELTO 20 MG TABS tablet TAKE 1 TABLET BY MOUTH EVERY DAY WITH SUPPER 11/02/21   Kathleene Hazel, MD  ? ? ?Family History ?Family History  ?Problem Relation Age of Onset  ? Heart disease Mother   ? Heart disease Father   ? Colon cancer Neg Hx   ? Esophageal cancer Neg Hx   ? Rectal cancer Neg Hx   ? Stomach cancer Neg Hx   ? ? ?Social History ?Social History  ? ?Tobacco Use  ? Smoking status: Former  ?  Packs/day: 1.00  ?  Years: 20.00  ?  Pack years: 20.00  ?  Types: Cigarettes  ?  Quit date: 10/14/1988  ?  Years since quitting: 33.3  ? Smokeless tobacco: Never  ? Tobacco comments:  ?  "quit smoking cigarettes in the 1980s"  ?Vaping Use  ? Vaping Use: Never used  ?Substance Use Topics  ? Alcohol use: Yes  ?  Alcohol/week: 0.0 standard drinks  ?  Comment: 02/27/2016 "nothing in the last few weeks; no regular pattern"  ? Drug use: No  ? ? ? ?Allergies   ?Iodinated contrast media, Sulfa antibiotics, Aleve  [naproxen sodium], Apple juice, Banana, Beef-derived products, Erythromycin, Fish allergy, Garlic, Sesame seed (diagnostic), Shellfish allergy, Sulfonamide derivatives, Tomato, Iodine, Strawberry (diagnostic), and Sweet potato ? ? ?Review of Systems ?Review of Systems  ?Constitutional: Negative.   ?HENT: Negative.    ?Respiratory:  Positive for cough.   ?Cardiovascular: Negative.   ?Gastrointestinal: Negative.   ?Genitourinary: Negative.   ?Musculoskeletal: Negative.   ? ? ?Physical Exam ?Triage Vital Signs ?ED Triage Vitals  ?Enc Vitals Group  ?   BP  01/25/22 0835 135/73  ?   Pulse Rate 01/25/22 0835 61  ?   Resp 01/25/22 0835 17  ?   Temp 01/25/22 0835 97.9 ?F (36.6 ?C)  ?   Temp Source 01/25/22 0835 Oral  ?   SpO2 01/25/22 0835 96 %  ?   Weight --   ?   Height --   ?   Head Circumference --   ?   Peak Flow --   ?   Pain Score 01/25/22 0834 0  ?   Pain Loc --   ?   Pain Edu? --   ?   Excl. in [place]? --   ? ?No data found. ? ?Updated Vital Signs ?BP 135/73 (BP Location: Left Arm)   Pulse 61   Temp 97.9 ?F (36.6 ?C) (Oral)   Resp 17   SpO2 96%  ? ?Visual Acuity ?Right Eye Distance:   ?Left Eye Distance:   ?Bilateral Distance:   ? ?Right Eye Near:   ?Left Eye Near:    ?Bilateral Near:    ? ?Physical Exam ?Constitutional:   ?   Appearance: Normal appearance.  ?HENT:  ?   Head: Normocephalic and atraumatic.  ?   Mouth/Throat:  ?   Mouth: Mucous membranes are moist.  ?Cardiovascular:  ?   Rate and Rhythm: Normal rate and regular rhythm.  ?Pulmonary:  ?   Effort: Pulmonary effort is normal. No respiratory distress.  ?   Breath sounds: Normal breath sounds. No wheezing.  ?Musculoskeletal:  ?   Cervical back: Neck supple.  ?Neurological:  ?   Mental Status: He is alert.  ? ? ? ?UC Treatments / Results  ?Labs ?(all labs ordered are listed, but only abnormal results are displayed) ?Labs Reviewed - No data to display ? ?EKG ? ? ?Radiology ?No results found. ? ?Procedures ?Procedures (including critical care  time) ? ?Medications Ordered in UC ?Medications - No data to display ? ?Initial Impression / Assessment and Plan / UC Course  ?I have reviewed the triage vital signs and the nursing notes. ? ?Pertinent labs & imaging results that

## 2022-01-25 NOTE — ED Triage Notes (Signed)
Pt reports that he was seen here last week for allergies. Pt reports still has cough that is productive but not as much as was last week. Reports has surgery on Monday and wants to make sure is ok to have it and what we can do about cough.  ?

## 2022-01-25 NOTE — Discharge Instructions (Signed)
You were seen today for continued cough.  ?Your exam was normal.  I have sent out a cough pill to help with your symptoms.  ?I believe you are okay for your surgery on Monday.  ?If you develop fevers, shortness of breath, or worsening symptoms then please return for re-evaluation.  ?

## 2022-01-28 ENCOUNTER — Emergency Department (HOSPITAL_COMMUNITY)
Admission: EM | Admit: 2022-01-28 | Discharge: 2022-01-29 | Disposition: A | Discharge status: Other Acute Inpatient Hospital | Payer: Medicare Other | Attending: Emergency Medicine | Admitting: Emergency Medicine

## 2022-01-28 ENCOUNTER — Other Ambulatory Visit: Payer: Self-pay

## 2022-01-28 ENCOUNTER — Emergency Department (HOSPITAL_COMMUNITY): Payer: Medicare Other

## 2022-01-28 ENCOUNTER — Encounter (HOSPITAL_COMMUNITY): Payer: Self-pay

## 2022-01-28 DIAGNOSIS — I480 Paroxysmal atrial fibrillation: Secondary | ICD-10-CM | POA: Insufficient documentation

## 2022-01-28 DIAGNOSIS — Z7901 Long term (current) use of anticoagulants: Secondary | ICD-10-CM | POA: Insufficient documentation

## 2022-01-28 DIAGNOSIS — R55 Syncope and collapse: Secondary | ICD-10-CM | POA: Insufficient documentation

## 2022-01-28 DIAGNOSIS — Z7982 Long term (current) use of aspirin: Secondary | ICD-10-CM | POA: Insufficient documentation

## 2022-01-28 DIAGNOSIS — K625 Hemorrhage of anus and rectum: Secondary | ICD-10-CM | POA: Insufficient documentation

## 2022-01-28 DIAGNOSIS — Z8719 Personal history of other diseases of the digestive system: Secondary | ICD-10-CM

## 2022-01-28 HISTORY — DX: Personal history of other diseases of the digestive system: Z87.19

## 2022-01-28 LAB — TROPONIN I (HIGH SENSITIVITY): Troponin I (High Sensitivity): 12 ng/L (ref <18)

## 2022-01-28 LAB — CBC WITH DIFFERENTIAL/PLATELET
Abs Immature Granulocytes: 0.15 10*3/uL — ABNORMAL HIGH (ref 0.00–0.07)
Basophils Absolute: 0.1 10*3/uL (ref 0.0–0.1)
Basophils Relative: 0 %
Eosinophils Absolute: 0 10*3/uL (ref 0.0–0.5)
Eosinophils Relative: 0 %
HCT: 35.9 % — ABNORMAL LOW (ref 39.0–52.0)
Hemoglobin: 11.7 g/dL — ABNORMAL LOW (ref 13.0–17.0)
Immature Granulocytes: 1 %
Lymphocytes Relative: 4 %
Lymphs Abs: 0.8 10*3/uL (ref 0.7–4.0)
MCH: 29.7 pg (ref 26.0–34.0)
MCHC: 32.6 g/dL (ref 30.0–36.0)
MCV: 91.1 fL (ref 80.0–100.0)
Monocytes Absolute: 0.6 10*3/uL (ref 0.1–1.0)
Monocytes Relative: 3 %
Neutro Abs: 20.5 10*3/uL — ABNORMAL HIGH (ref 1.7–7.7)
Neutrophils Relative %: 92 %
Platelets: 346 10*3/uL (ref 150–400)
RBC: 3.94 MIL/uL — ABNORMAL LOW (ref 4.22–5.81)
RDW: 12.8 % (ref 11.5–15.5)
WBC: 22 10*3/uL — ABNORMAL HIGH (ref 4.0–10.5)
nRBC: 0 % (ref 0.0–0.2)

## 2022-01-28 LAB — BASIC METABOLIC PANEL
Anion gap: 9 (ref 5–15)
BUN: 22 mg/dL (ref 8–23)
CO2: 23 mmol/L (ref 22–32)
Calcium: 9.1 mg/dL (ref 8.9–10.3)
Chloride: 106 mmol/L (ref 98–111)
Creatinine, Ser: 1.09 mg/dL (ref 0.61–1.24)
GFR, Estimated: >60 mL/min (ref >60)
Glucose, Bld: 190 mg/dL — ABNORMAL HIGH (ref 70–99)
Potassium: 4.5 mmol/L (ref 3.5–5.1)
Sodium: 138 mmol/L (ref 135–145)

## 2022-01-28 MED ORDER — SODIUM CHLORIDE 0.9 % IV BOLUS
500.0000 mL | Freq: Once | INTRAVENOUS | Status: AC
  Administered 2022-01-28: 500 mL via INTRAVENOUS

## 2022-01-28 MED ORDER — SODIUM CHLORIDE 0.9 % IV BOLUS
1000.0000 mL | Freq: Once | INTRAVENOUS | Status: AC
  Administered 2022-01-29: 1000 mL via INTRAVENOUS

## 2022-01-28 NOTE — ED Provider Notes (Signed)
?[facility] DEPT ?Provider Note ? ? ?CSN: [id number] ?Arrival date & time: 01/28/22  2146 ? ?  ? ?History ? ?Chief Complaint  ?Patient presents with  ? Rectal Bleeding  ? ? ?Travis Jacobs is a 71 y.o. male. ? ?71 year old male with prior medical history as detailed below presents for evaluation.  Patient arrives by EMS.  Patient reports that earlier today he was at [facility] and had hemorrhoidectomy performed with colorectal surgery (Dr. Drue Flirt).  ? ?He reports that he was discharged after surgery.  He arrived home around 430.  He reports continued rectal bleeding since returning home. He was administered Exparel and has no reported pain. ? ?He was on Xarelto prior to the surgery (for paroxysmal atrial fib) - which he stopped 4 days ago. ? ?He decided to call the EMS crew for transport to the ED this evening after he had a syncopal event.  EMS reports that he was on the floor when they found him.  There was approximately - 500 mL - of blood on the floor at home per EMS estimate. ? ?The history is provided by the patient and medical records.  ?Rectal Bleeding ?Quality:  Bright red ?Amount:  Moderate ?Duration:  6 hours ?Timing:  Constant ?Chronicity:  New ?Context: hemorrhoids   ? ?  ? ?Home Medications ?Prior to Admission medications   ?Medication Sig Start Date End Date Taking? Authorizing Provider  ?acetaminophen (TYLENOL) 500 MG tablet Take 500 mg by mouth every 8 (eight) hours as needed for moderate pain or headache.    [provider]  ?Apoaequorin (PREVAGEN EXTRA STRENGTH) 20 MG CAPS Take 20 mg by mouth daily.    [provider]  ?aspirin EC 81 MG tablet Take 1 tablet (81 mg total) by mouth daily. 10/23/17   Burnell Blanks, MD  ?atorvastatin (LIPITOR) 40 MG tablet Take 1 tablet (40 mg total) by mouth every evening. 01/11/20   Collene Gobble, MD  ?benzonatate (TESSALON) 200 MG capsule Take 1 capsule (200 mg total) by mouth 3 (three) times daily.  01/25/22   Piontek, Junie Panning, MD  ?CIALIS 5 MG tablet Take 5 mg by mouth daily as needed for erectile dysfunction.  02/27/15   [provider]  ?docusate sodium (COLACE) 100 MG capsule Take 300 mg by mouth 2 (two) times daily.    [provider]  ?Ferrous Sulfate (IRON SLOW RELEASE PO) Take by mouth daily.    [provider]  ?fexofenadine (ALLEGRA) 180 MG tablet Take 180 mg by mouth daily. ?Patient not taking: Reported on 01/17/2022    [provider]  ?halobetasol (ULTRAVATE) 0.05 % cream Apply 1 application topically daily as needed (hemorrhoids).     [provider]  ?ketotifen (ZADITOR) 0.025 % ophthalmic solution Place 1 drop into both eyes 2 (two) times daily as needed. ?Patient not taking: Reported on 01/17/2022    [provider]  ?metoprolol tartrate (LOPRESSOR) 25 MG tablet TAKE 1 TABLET BY MOUTH TWICE A DAY 02/13/21   Burnell Blanks, MD  ?Multiple Vitamin (MULTIVITAMIN WITH MINERALS) TABS tablet Take 1 tablet by mouth at bedtime.    [provider]  ?nitroGLYCERIN (NITROSTAT) 0.4 MG SL tablet Place 1 tablet (0.4 mg total) under the tongue every 5 (five) minutes x 3 doses as needed for chest pain. 01/11/20   Collene Gobble, MD  ?pantoprazole (PROTONIX) 40 MG tablet Take 1 tablet (40 mg total) by mouth daily. 01/11/20   Collene Gobble,  MD  ?Probiotic Product (PROBIOTIC DAILY PO) Take 1 capsule by mouth daily.     [provider]  ?XARELTO 20 MG TABS tablet TAKE 1 TABLET BY MOUTH EVERY DAY WITH SUPPER 11/02/21   Burnell Blanks, MD  ?   ? ?Allergies    ?Iodinated contrast media, Sulfa antibiotics, Aleve [naproxen sodium], Apple juice, Banana, Beef-derived products, Erythromycin, Fish allergy, Garlic, Sesame seed (diagnostic), Shellfish allergy, Sulfonamide derivatives, Tomato, Iodine, Strawberry (diagnostic), and Sweet potato   ? ?Review of Systems   ?Review of Systems  ?Gastrointestinal:  Positive for hematochezia.  ?All other  systems reviewed and are negative. ? ?Physical Exam ?Updated Vital Signs ?BP 131/73   Pulse 70   Temp 97.6 ?F (36.4 ?C) (Oral)   Resp 20   Ht 5\' 10"  (1.778 m)   Wt 83.9 kg   SpO2 100%   BMI 26.54 kg/m?  ?Physical Exam ?Vitals and nursing note reviewed.  ?Constitutional:   ?   General: He is not in acute distress. ?   Appearance: Normal appearance. He is well-developed.  ?HENT:  ?   Head: Normocephalic and atraumatic.  ?Eyes:  ?   Conjunctiva/sclera: Conjunctivae normal.  ?   Pupils: Pupils are equal, round, and reactive to light.  ?Cardiovascular:  ?   Rate and Rhythm: Normal rate and regular rhythm.  ?   Heart sounds: Normal heart sounds.  ?Pulmonary:  ?   Effort: Pulmonary effort is normal. No respiratory distress.  ?   Breath sounds: Normal breath sounds.  ?Abdominal:  ?   General: There is no distension.  ?   Palpations: Abdomen is soft.  ?   Tenderness: There is no abdominal tenderness.  ?Musculoskeletal:     ?   General: No deformity. Normal range of motion.  ?   Cervical back: Normal range of motion and neck supple.  ?Skin: ?   General: Skin is warm and dry.  ?Neurological:  ?   General: No focal deficit present.  ?   Mental Status: He is alert and oriented to person, place, and time.  ? ? ?ED Results / Procedures / Treatments   ?Labs ?(all labs ordered are listed, but only abnormal results are displayed) ?Labs Reviewed  ?CBC WITH DIFFERENTIAL/PLATELET  ?BASIC METABOLIC PANEL  ?TYPE AND SCREEN  ?TROPONIN I (HIGH SENSITIVITY)  ? ? ?EKG ?EKG Interpretation ? ?Date/Time:  Monday January 28 2022 22:20:10 EDT ?Ventricular Rate:  70 ?PR Interval:  148 ?QRS Duration: 129 ?QT Interval:  392 ?QTC Calculation: 423 ?R Axis:   43 ?Text Interpretation: Sinus rhythm Nonspecific intraventricular conduction delay Confirmed by Dene Gentry [phone]) on 01/28/2022 10:23:48 PM ? ?Radiology ?DG Chest Port 1 View ? ?Result Date: 01/28/2022 ?CLINICAL DATA:  Syncope. EXAM: PORTABLE CHEST 1 VIEW COMPARISON:  Chest x-ray  01/17/2022 FINDINGS: The heart size and mediastinal contours are within normal limits. There is stable linear scarring in the right upper lobe. The lungs are otherwise clear. Are unremarkable. IMPRESSION: No active disease. Electronically Signed   By: Ronney Asters M.D.   On: 01/28/2022 22:21   ? ?Procedures ?Procedures  ? ? ?Medications Ordered in ED ?Medications  ?sodium chloride 0.9 % bolus 500 mL (has no administration in time range)  ? ? ?ED Course/ Medical Decision Making/ A&P ?  ?                        ?Medical Decision Making ?Amount and/or Complexity of Data Reviewed ?Labs: ordered. ?  Radiology: ordered. ? ? ? ?Medical Screen Complete ? ?This patient presented to the ED with complaint of bleeding post hemorrhoidectomy. ? ?This complaint involves an extensive number of treatment options. The initial differential diagnosis includes, but is not limited to, excessive bleeding, symptomatic anemia, metabolic abnormality, etc. ? ?This presentation is: Acute, Self-Limited, Previously Undiagnosed, Uncertain Prognosis, Complicated, Systemic Symptoms, and Threat to Life/Bodily Function ? ?Patient is status post hemorrhoidectomy this afternoon with Dr. Drue Flirt at [facility]. ? ?Patient reports significant bleeding that occurred after he returned home. ? ?Patient reported syncopal event that occurred just prior to him notifying EMS. ? ?Patient is now feeling somewhat improved.  He is continuing to bleed from his rectum. ? ?Patient's hemoglobin in the ED is 11.7.  Preoperatively, his hemoglobin appears to have been 14. ? ?Patient's hemodynamics in the ED appear to be stable.  Heart rates in the 60-70s.  Blood pressures approximately [id number] systolic. ? ?Case discussed with Dr. Alvan Dame (covering Dr. Geronimo Boot service at [facility]).  Patient accepted as an ED to ED transfer.  Accepting is Dr. Alvan Dame. ? ?1215 - No [facility] transport crew available. Patient with continued active rectal bleeding. Will contact [facility] for  assistance with transport. ETA of transport unclear. Case discussed briefly with Dr. Marlou Starks (surgery at [facility]). Patient likely would benefit from transfusion and will also give TXA. Rolled 4x4 packing placed tightly against patie

## 2022-01-28 NOTE — ED Notes (Signed)
[facility] called for transport availability. No units available at this time. ?

## 2022-01-28 NOTE — ED Triage Notes (Signed)
Pt. BIB [facility] c/o rectal bleeding. He had a hemorrhoidectomy today at [facility] and was discharged. When he got home, he started bleeding. Pt. Has had a syncopal episode today leading him to call EMS. EMS states that there was about 562ml of blood on the floor, he was pale with delayed capillary refill. Pt. Stopped taking his xarelto 4 days ago for the surgery.  ? ?EMS VS: ?BP:110/60 ?HR: 72 ?O2: 94% RA ?

## 2022-01-29 DIAGNOSIS — K625 Hemorrhage of anus and rectum: Secondary | ICD-10-CM | POA: Diagnosis not present

## 2022-01-29 LAB — PREPARE RBC (CROSSMATCH)

## 2022-01-29 LAB — ABO/RH: ABO/RH(D): A POS

## 2022-01-29 MED ORDER — SODIUM CHLORIDE 0.9 % IV SOLN: 10.0000 mL/h | Freq: Once | INTRAVENOUS | Status: DC

## 2022-01-29 MED ORDER — PIPERACILLIN-TAZOBACTAM 3.375 G IVPB 30 MIN: 3.3750 g | Freq: Once | INTRAVENOUS | Status: DC

## 2022-01-29 MED ORDER — TRANEXAMIC ACID-NACL 1000-0.7 MG/100ML-% IV SOLN
1000.0000 mg | Freq: Once | INTRAVENOUS | Status: AC
  Administered 2022-01-29: 1000 mg via INTRAVENOUS
  Filled 2022-01-29: qty 100

## 2022-01-29 MED ORDER — ONDANSETRON HCL 4 MG/2ML IJ SOLN
4.0000 mg | Freq: Once | INTRAMUSCULAR | Status: AC
Start: 2022-01-29 — End: 2022-01-29
  Administered 2022-01-29: 4 mg via INTRAVENOUS
  Filled 2022-01-29: qty 2

## 2022-01-29 NOTE — ED Notes (Signed)
Report given to Linton Rump, RN at [facility] ?

## 2022-01-30 LAB — TYPE AND SCREEN
ABO/RH(D): A POS
Antibody Screen: NEGATIVE
Unit division: 0
Unit division: 0

## 2022-01-30 LAB — BPAM RBC
Blood Product Expiration Date: 202305092359
Blood Product Expiration Date: 202305092359
ISSUE DATE / TIME: 202304180025
ISSUE DATE / TIME: 202304180141
Unit Type and Rh: 6200
Unit Type and Rh: 6200

## 2022-02-22 ENCOUNTER — Other Ambulatory Visit: Payer: Self-pay

## 2022-02-22 ENCOUNTER — Telehealth: Payer: Self-pay | Admitting: Pharmacy Technician

## 2022-02-22 DIAGNOSIS — D509 Iron deficiency anemia, unspecified: Secondary | ICD-10-CM

## 2022-02-22 DIAGNOSIS — D649 Anemia, unspecified: Secondary | ICD-10-CM | POA: Insufficient documentation

## 2022-02-22 NOTE — Telephone Encounter (Signed)
Auth Submission: no auth needed ?Payer: MEDICARE ?Medication & CPT/J Code(s) submitted: Feraheme (ferumoxytol) [id number] ?Route of submission (phone, fax, portal): PHONE ?Auth type: Buy/Bill ?Units/visits requested: X2 DOSES ?Reference number: [id number] ?Approval from: 02/22/22 to 09/24/22  ?Patient has met $ 226 deductible  ?

## 2022-02-25 ENCOUNTER — Other Ambulatory Visit: Payer: Self-pay | Admitting: Cardiovascular Disease

## 2022-02-28 ENCOUNTER — Ambulatory Visit (INDEPENDENT_AMBULATORY_CARE_PROVIDER_SITE_OTHER): Payer: Medicare Other

## 2022-02-28 VITALS — BP 133/71 | HR 60 | Temp 97.8°F | Resp 16 | Ht 70.0 in | Wt 176.2 lb

## 2022-02-28 DIAGNOSIS — D509 Iron deficiency anemia, unspecified: Secondary | ICD-10-CM

## 2022-02-28 MED ORDER — SODIUM CHLORIDE 0.9 % IV SOLN
510.0000 mg | Freq: Once | INTRAVENOUS | Status: AC
  Administered 2022-02-28: 510 mg via INTRAVENOUS
  Filled 2022-02-28: qty 17

## 2022-02-28 NOTE — Progress Notes (Signed)
Diagnosis: Iron Deficiency Anemia  Provider:  Marshell Garfinkel, MD  Procedure: Infusion  IV Type: Peripheral, IV Location: L Antecubital  Feraheme (Ferumoxytol), Dose: 510 mg  Infusion Start Time: [id number]  Infusion Stop Time: [id number]  Post Infusion IV Care: Observation period completed  Discharge: Condition: Good, Destination: Home . AVS provided to patient.   Performed by:  Paul Dykes, RN

## 2022-03-07 ENCOUNTER — Ambulatory Visit (INDEPENDENT_AMBULATORY_CARE_PROVIDER_SITE_OTHER): Payer: Medicare Other

## 2022-03-07 VITALS — BP 119/71 | HR 53 | Temp 98.3°F | Resp 16 | Ht 70.0 in | Wt 174.8 lb

## 2022-03-07 DIAGNOSIS — D509 Iron deficiency anemia, unspecified: Secondary | ICD-10-CM

## 2022-03-07 MED ORDER — SODIUM CHLORIDE 0.9 % IV SOLN
510.0000 mg | Freq: Once | INTRAVENOUS | Status: AC
  Administered 2022-03-07: 510 mg via INTRAVENOUS
  Filled 2022-03-07: qty 17

## 2022-03-07 NOTE — Progress Notes (Signed)
Diagnosis: Iron Deficiency Anemia  Provider:  Marshell Garfinkel, MD  Procedure: Infusion  IV Type: Peripheral, IV Location: L Antecubital  Feraheme (Ferumoxytol), Dose: 510 mg  Infusion Start Time: [id number]  Infusion Stop Time: 1130  Post Infusion IV Care: Peripheral IV Discontinued  Discharge: Condition: Good, Destination: Home . AVS provided to patient.   Performed by:  Adelina Mings, LPN

## 2022-05-03 ENCOUNTER — Other Ambulatory Visit: Payer: Self-pay | Admitting: Cardiovascular Disease

## 2022-05-03 NOTE — Telephone Encounter (Signed)
Prescription refill request for Xarelto received.  Indication:Afib Last office visit:upcoming Weight:79.3 kg Age:71 Scr:0.9 CrCl:83.22 ml/min  Prescription refilled

## 2022-05-14 NOTE — Progress Notes (Unsigned)
No chief complaint on file.  History of Present Illness: 70 yo male with history of CAD, paroxysmal atrial fibrillation, PVCs, HLD and GERD who is here today for cardiac follow up. He had a cardiac cath in May 2017 in the setting of unstable angina which showed a severe stenosis in the first obtuse marginal branch. A drug eluting stent was placed in the first OM. There was mild disease in the RCA and LAD. LV function was normal.  Frequent PVCs noted during his hospital stay. Cardiac monitor June 2017 with frequent PVCS and periods of sinus bradycardia. He was seen in the EP clinic by Dr. Graciela Husbands and he recommended the patient continue his beta blocker. He called our office complaining of palpitations at home mid December 2018 with HR up to the 150s. Cardiac monitor December 2018 showed sinus bradycardia and episodes of atrial fibrillation/flutter. He is now on a beta blocker and Xarelto.   He is here today for follow up. The patient denies any chest pain, dyspnea, palpitations, lower extremity edema, orthopnea, PND, dizziness, near syncope or syncope.    Primary Care Physician: Charlane Ferretti, DO  Past Medical History:  Diagnosis Date   A-fib [facility])    CAD (coronary artery disease)    a. DES to OM1 02/2016.    Cataract    bil cateracts   Diverticulosis    Esophageal stricture    GERD (gastroesophageal reflux disease)    Hemorrhoids    Hiatal hernia    Hyperlipidemia    IBS (irritable bowel syndrome)    Multiple food allergies    "lots of food allergies; beef, tuna, flounder, potatoes, garlic, tomatoes, apples, bananas, sesame seeds, shellfish   Pre-diabetes    PVC's (premature ventricular contractions)     Past Surgical History:  Procedure Laterality Date   BRONCHIAL WASHINGS  01/11/2020   Procedure: BRONCHIAL WASHINGS;  Surgeon: Leslye Peer, MD;  Location: [facility] ENDOSCOPY;  Service: Cardiopulmonary;;   CARDIAC CATHETERIZATION N/A 02/28/2016   Procedure: Left Heart Cath and Coronary  Angiography;  Surgeon: Iran Ouch, MD;  Location: [facility] INVASIVE CV LAB;  Service: Cardiovascular;  Laterality: N/A;   CARDIAC CATHETERIZATION N/A 02/28/2016   Procedure: Coronary Stent Intervention;  Surgeon: Iran Ouch, MD;  Location: [facility] INVASIVE CV LAB;  Service: Cardiovascular;  Laterality: N/A;   COLONOSCOPY     CYSTOSCOPY WITH STENT PLACEMENT     ESOPHAGOGASTRODUODENOSCOPY (EGD) WITH ESOPHAGEAL DILATION     HEMORRHOID BANDING     HERNIA REPAIR     SURGERY SCROTAL / TESTICULAR Left ~ 1960   "hernia"   TONSILLECTOMY     UPPER GASTROINTESTINAL ENDOSCOPY     VIDEO BRONCHOSCOPY N/A 01/11/2020   Procedure: VIDEO BRONCHOSCOPY WITHOUT FLUORO;  Surgeon: Leslye Peer, MD;  Location: [facility] ENDOSCOPY;  Service: Cardiopulmonary;  Laterality: N/A;    Current Outpatient Medications  Medication Sig Dispense Refill   acetaminophen (TYLENOL) 500 MG tablet Take 500 mg by mouth every 8 (eight) hours as needed for moderate pain or headache.     aspirin EC 81 MG tablet Take 1 tablet (81 mg total) by mouth daily. 90 tablet 3   atorvastatin (LIPITOR) 40 MG tablet Take 1 tablet (40 mg total) by mouth every evening. (Patient taking differently: Take 80 mg by mouth every evening.)     benzonatate (TESSALON) 200 MG capsule Take 1 capsule (200 mg total) by mouth 3 (three) times daily. 21 capsule 0   CIALIS 5 MG tablet Take 5  mg by mouth daily as needed for erectile dysfunction.   98   docusate sodium (COLACE) 100 MG capsule Take 300 mg by mouth 2 (two) times daily.     hydrocortisone (ANUSOL-HC) 25 MG suppository Place 25 mg rectally 2 (two) times daily.     loratadine (CLARITIN) 10 MG tablet Take 10 mg by mouth daily.     metoprolol tartrate (LOPRESSOR) 25 MG tablet TAKE 1 TABLET BY MOUTH TWICE A DAY 180 tablet 0   Multiple Vitamin (MULTIVITAMIN WITH MINERALS) TABS tablet Take 1 tablet by mouth at bedtime.     nitroGLYCERIN (NITROSTAT) 0.4 MG SL tablet Place 1 tablet (0.4 mg total) under the tongue  every 5 (five) minutes x 3 doses as needed for chest pain. (Patient not taking: Reported on 01/28/2022)     pantoprazole (PROTONIX) 40 MG tablet Take 1 tablet (40 mg total) by mouth daily.     rivaroxaban (XARELTO) 20 MG TABS tablet TAKE 1 TABLET BY MOUTH EVERY DAY WITH SUPPER 90 tablet 0   No current facility-administered medications for this visit.    Allergies  Allergen Reactions   Iodinated Contrast Media Swelling   Shellfish Allergy Anaphylaxis    Not sure of reaction found out about allergy through [place]   Sulfa Antibiotics Swelling    Mouth swelling   Aleve [Naproxen Sodium]     Doesn't remember   Apple Juice Swelling   Banana Nausea And Vomiting   Beef-Derived Products Nausea And Vomiting   Erythromycin Swelling    Mouth swells   Fish Allergy Swelling    Tuna and flounder   Garlic Swelling   Sesame Seed (Diagnostic) Swelling   Sulfonamide Derivatives Swelling    Mouth swelling   Tomato Nausea And Vomiting    welts   Iodine Swelling    "I blow up like a balloon"   Strawberry (Diagnostic) Rash   Sweet Potato Other (See Comments)    Sweet and Regular Potatoes - diarrhea    Social History   Socioeconomic History   Marital status: Married    Spouse name: Not on file   Number of children: Not on file   Years of education: Not on file   Highest education level: Not on file  Occupational History   Not on file  Tobacco Use   Smoking status: Former    Packs/day: 1.00    Years: 20.00    Total pack years: 20.00    Types: Cigarettes    Quit date: 10/14/1988    Years since quitting: 33.6   Smokeless tobacco: Never   Tobacco comments:    "quit smoking cigarettes in the 1980s"  Vaping Use   Vaping Use: Never used  Substance and Sexual Activity   Alcohol use: Yes    Alcohol/week: 0.0 standard drinks of alcohol    Comment: 02/27/2016 "nothing in the last few weeks; no regular pattern"   Drug use: No   Sexual activity: Yes  Other Topics Concern   Not on file   Social History Narrative   Not on file   Social Determinants of Health   Financial Resource Strain: Not on file  Food Insecurity: Not on file  Transportation Needs: Not on file  Physical Activity: Not on file  Stress: Not on file  Social Connections: Not on file  Intimate Partner Violence: Not on file    Family History  Problem Relation Age of Onset   Heart disease Mother    Heart disease Father  Colon cancer Neg Hx    Esophageal cancer Neg Hx    Rectal cancer Neg Hx    Stomach cancer Neg Hx     Review of Systems:  As stated in the HPI and otherwise negative.   There were no vitals taken for this visit.  Physical Examination: General: Well developed, well nourished, NAD  HEENT: OP clear, mucus membranes moist  SKIN: warm, dry. No rashes. Neuro: No focal deficits  Musculoskeletal: Muscle strength 5/5 all ext  Psychiatric: Mood and affect normal  Neck: No JVD, no carotid bruits, no thyromegaly, no lymphadenopathy.  Lungs:Clear bilaterally, no wheezes, rhonci, crackles Cardiovascular: Regular rate and rhythm. No murmurs, gallops or rubs. Abdomen:Soft. Bowel sounds present. Non-tender.  Extremities: No lower extremity edema. Pulses are 2 + in the bilateral DP/PT.  Cardiac cath 02/28/16: Prox RCA lesion, 30% stenosed. Mid RCA to Dist RCA lesion, 40% stenosed. Prox LAD to Mid LAD lesion, 30% stenosed. 1st Mrg lesion, 99% stenosed. Post intervention, there is a 0% residual stenosis. The left ventricular systolic function is normal.   1. Severe (99% ) one-vessel coronary artery disease involving proximal OM1 which is very large and supplies most of the left circumflex distribution. 2. Normal LV systolic function and mildly elevated left ventricular end-diastolic pressure. 3. Successful angioplasty and drug-eluting stent placement to OM 1.  Echo 01/15/17: - Left ventricle: The cavity size was normal. Wall thickness was   normal. Systolic function was normal. The  estimated ejection   fraction was in the range of 60% to 65%. Wall motion was normal;   there were no regional wall motion abnormalities. Doppler   parameters are consistent with abnormal left ventricular   relaxation (grade 1 diastolic dysfunction). The E/e&' ratio is   between 8-15, suggesting indeterminate LV filling pressure. - Mitral valve: Mildly thickened leaflets . There was trivial   regurgitation. - Left atrium: The atrium was normal in size. - Atrial septum: No defect or patent foramen ovale was identified. - Tricuspid valve: There was mild regurgitation. - Pulmonary arteries: PA peak pressure: 38 mm Hg (S). - Inferior vena cava: The vessel was normal in size. The   respirophasic diameter changes were in the normal range (>= 50%),   consistent with normal central venous pressure.   Impressions:   - LVEF 60-65%, normal wall thickness, normal wall motion, grade 1   DD with indeterminate LV filling pressure, normal LA size, upper   normal RA size, trivial MR, mild TR, RVSP 38 mmHg, normal IVC.  EKG:  EKG is *** ordered today. The ekg ordered today demonstrates   Recent Labs: 01/28/2022: BUN 22; Creatinine, Ser 1.09; Hemoglobin 11.7; Platelets 346; Potassium 4.5; Sodium 138   Lipid Panel Followed in primary care   Wt Readings from Last 3 Encounters:  03/07/22 174 lb 12.8 oz (79.3 kg)  02/28/22 176 lb 3.2 oz (79.9 kg)  01/28/22 185 lb (83.9 kg)     Other studies Reviewed: Additional studies/ records that were reviewed today include: . Review of the above records demonstrates:   Assessment and Plan:   1. CAD without angina: He had a drug eluting stent placed in the obtuse marginal branch of the Circumflex in May 2017. Normal LV function by echo April 2018. No chest pain suggestive of angina. Continue ASA, beta blocker and statin.       2. HLD: Lipids followed in primary care. LDL ***. Continue statin  3. Atrial fibrillation, paroxysmal: Sinus today. Continue Xarelto  and beta blocker.  Current medicines are reviewed at length with the patient today.  The patient does not have concerns regarding medicines.  The following changes have been made:  no change  Labs/ tests ordered today include:   No orders of the defined types were placed in this encounter.   Disposition:   F/U with me in 12 months  Signed, Verne Carrow, MD 05/14/2022 11:00 AM    [facility] [address] [place], [place], [place]  [address] Phone: [phone]; Fax: [phone]

## 2022-05-15 ENCOUNTER — Ambulatory Visit (INDEPENDENT_AMBULATORY_CARE_PROVIDER_SITE_OTHER): Payer: Medicare Other | Admitting: Cardiovascular Disease

## 2022-05-15 ENCOUNTER — Encounter: Payer: Self-pay | Admitting: Cardiovascular Disease

## 2022-05-15 VITALS — BP 120/76 | HR 65 | Ht 70.0 in | Wt 178.6 lb

## 2022-05-15 DIAGNOSIS — E7800 Pure hypercholesterolemia, unspecified: Secondary | ICD-10-CM | POA: Diagnosis not present

## 2022-05-15 DIAGNOSIS — I2510 Atherosclerotic heart disease of native coronary artery without angina pectoris: Secondary | ICD-10-CM | POA: Diagnosis not present

## 2022-05-15 DIAGNOSIS — I480 Paroxysmal atrial fibrillation: Secondary | ICD-10-CM

## 2022-05-15 MED ORDER — NITROGLYCERIN 0.4 MG SL SUBL: 0.4000 mg | SUBLINGUAL_TABLET | SUBLINGUAL | 3 refills | Status: DC | PRN

## 2022-05-15 MED ORDER — METOPROLOL TARTRATE 25 MG PO TABS: 25.0000 mg | ORAL_TABLET | Freq: Two times a day (BID) | ORAL | 3 refills | Status: DC

## 2022-05-15 NOTE — Patient Instructions (Signed)
Medication Instructions:  No changes *If you need a refill on your cardiac medications before your next appointment, please call your pharmacy*   Lab Work: none   Testing/Procedures: none   Follow-Up: At [facility], you and your health needs are our priority.  As part of our continuing mission to provide you with exceptional heart care, we have created designated Provider Care Teams.  These Care Teams include your primary Cardiologist (physician) and Advanced Practice Providers (APPs -  Physician Assistants and Nurse Practitioners) who all work together to provide you with the care you need, when you need it.   Your next appointment:   12 month(s)  The format for your next appointment:   In Person  Provider:   Christopher McAlhany, MD     Important Information About Sugar       

## 2022-07-25 ENCOUNTER — Telehealth: Payer: Self-pay | Admitting: *Deleted

## 2022-07-25 NOTE — Telephone Encounter (Signed)
   Pre-operative Risk Assessment    Patient Name: Travis Jacobs  DOB: 04-May-1951 MRN: [id number]      Request for Surgical Clearance    Procedure:   HERNIA SURGERY  Date of Surgery:  Clearance TBD                                 Surgeon:  DR. PAUL STECHSCHULTE Surgeon's Group or Practice Name:  [facility] Phone number:  [phone] Fax number:  [phone] ATTN: [name], MA   Type of Clearance Requested:   - Medical  - Pharmacy:  Hold Rivaroxaban (Xarelto)     Type of Anesthesia:  General    Additional requests/questions:    [name]   07/25/2022, 8:34 AM

## 2022-08-06 NOTE — Telephone Encounter (Signed)
Sending your way for input in Xarelto. Thank you!  Loel Dubonnet, NP

## 2022-08-07 ENCOUNTER — Telehealth: Payer: Self-pay | Admitting: *Deleted

## 2022-08-07 NOTE — Telephone Encounter (Signed)
Patient with diagnosis of atrial fibriliation  on Xarelto 20 mg daily  for anticoagulation.    Procedure: HERNIA SURGERY Date of procedure: TBD   CHA2DS2-VASc Score = 2  This indicates a 2.2% annual risk of stroke. The patient's score is based upon: CHF History: 0 HTN History: 0 Diabetes History: 0 Stroke History: 0 Vascular Disease History: 1 Age Score: 1 Gender Score: 0   CrCl: 82 ml/min (04/202023) Platelet count: 210 (01/30/2022) 01/30/22 Plt: 210    Per office protocol, patient can hold Xarelto 20 mg  for 2-3 days prior to procedure.   Patient will not need bridging with Lovenox (enoxaparin) around procedure.  **This guidance is not considered finalized until pre-operative APP has relayed final recommendations.**

## 2022-08-07 NOTE — Telephone Encounter (Signed)
Additional info for pharmacist:  CHA2DS2-VASc Score = 2   This indicates a 2.2% annual risk of stroke. The patient's score is based upon: CHF History: 0 HTN History: 0 Diabetes History: 0 Stroke History: 0 Vascular Disease History: 1 Age Score: 1 Gender Score: 0    01/30/22 Plt: 210  01/2022 Creatinine Clearance: 82 mL/min

## 2022-08-07 NOTE — Telephone Encounter (Signed)
   Patient Name: [name]  DOB: [birth year] MRN: [id number]  Primary Cardiologist: Lauree Chandler, MD  Chart reviewed as part of pre-operative protocol coverage. Given past medical history and time since last visit, based on ACC/AHA guidelines, Travis Jacobs needs phone visit for clearance. Will route to preop callback team to schedule.  Per pharmacy team may hold Xarelto 2-3 days prior to procedure.     Loel Dubonnet, NP 08/07/2022, 12:13 PM

## 2022-08-07 NOTE — Telephone Encounter (Signed)
Pt is agreeable to plan of care. Pt states his procedure is not going to be until Jan or Feb 2024. I have pt set for tele pre op appt 10/11/22 @ 9:00. Med rec and consent are done.

## 2022-08-07 NOTE — Telephone Encounter (Signed)
Pt is agreeable to plan of care. Pt states his procedure is not going to be until Jan or Feb 2024. I have pt set for tele pre op appt 10/11/22 @ 9:00. Med rec and consent are done.     Patient Consent for Virtual Visit        Travis Jacobs has provided verbal consent on 08/07/2022 for a virtual visit (video or telephone).   CONSENT FOR VIRTUAL VISIT FOR:  Travis Jacobs  By participating in this virtual visit I agree to the following:  I hereby voluntarily request, consent and authorize [facility] and its employed or contracted physicians, physician assistants, nurse practitioners or other licensed health care professionals (the Practitioner), to provide me with telemedicine health care services (the "Services") as deemed necessary by the treating Practitioner. I acknowledge and consent to receive the Services by the Practitioner via telemedicine. I understand that the telemedicine visit will involve communicating with the Practitioner through live audiovisual communication technology and the disclosure of certain medical information by electronic transmission. I acknowledge that I have been given the opportunity to request an in-person assessment or other available alternative prior to the telemedicine visit and am voluntarily participating in the telemedicine visit.  I understand that I have the right to withhold or withdraw my consent to the use of telemedicine in the course of my care at any time, without affecting my right to future care or treatment, and that the Practitioner or I may terminate the telemedicine visit at any time. I understand that I have the right to inspect all information obtained and/or recorded in the course of the telemedicine visit and may receive copies of available information for a reasonable fee.  I understand that some of the potential risks of receiving the Services via telemedicine include:  Delay or interruption in medical evaluation due to  technological equipment failure or disruption; Information transmitted may not be sufficient (e.g. poor resolution of images) to allow for appropriate medical decision making by the Practitioner; and/or  In rare instances, security protocols could fail, causing a breach of personal health information.  Furthermore, I acknowledge that it is my responsibility to provide information about my medical history, conditions and care that is complete and accurate to the best of my ability. I acknowledge that Practitioner's advice, recommendations, and/or decision may be based on factors not within their control, such as incomplete or inaccurate data provided by me or distortions of diagnostic images or specimens that may result from electronic transmissions. I understand that the practice of medicine is not an exact science and that Practitioner makes no warranties or guarantees regarding treatment outcomes. I acknowledge that a copy of this consent can be made available to me via my patient portal ([facility]), or I can request a printed copy by calling the office of [facility].    I understand that my insurance will be billed for this visit.   I have read or had this consent read to me. I understand the contents of this consent, which adequately explains the benefits and risks of the Services being provided via telemedicine.  I have been provided ample opportunity to ask questions regarding this consent and the Services and have had my questions answered to my satisfaction. I give my informed consent for the services to be provided through the use of telemedicine in my medical care

## 2022-08-14 ENCOUNTER — Other Ambulatory Visit: Payer: Self-pay | Admitting: Cardiovascular Disease

## 2022-08-14 DIAGNOSIS — I480 Paroxysmal atrial fibrillation: Secondary | ICD-10-CM

## 2022-08-15 ENCOUNTER — Encounter: Payer: Self-pay | Admitting: Cardiovascular Disease

## 2022-08-15 NOTE — Telephone Encounter (Signed)
Xarelto 20mg  refill request received. Pt is 71 years old, weight-81kg, Crea-1.09 on 01/28/2022, last seen by Dr. Angelena Form on 05/15/2022, Diagnosis-Afib, CrCl- 70.18 mL/min; Dose is appropriate based on dosing criteria. Will send in refill to requested pharmacy.

## 2022-09-26 ENCOUNTER — Telehealth: Payer: Self-pay | Admitting: Cardiovascular Disease

## 2022-09-26 NOTE — Telephone Encounter (Signed)
Returned pt call. He states that he is hoping to have procedure in February. Pt rescheduled tele visit for 11/01/22

## 2022-09-26 NOTE — Telephone Encounter (Signed)
Patient called wanting to cancel his pre-op clearance tele-visit as his surgery date has not been determined.  Patient stated he will call back to reschedule.

## 2022-10-08 ENCOUNTER — Other Ambulatory Visit: Payer: Self-pay

## 2022-10-08 ENCOUNTER — Telehealth: Payer: Self-pay | Admitting: Internal Medicine

## 2022-10-08 DIAGNOSIS — R1030 Lower abdominal pain, unspecified: Secondary | ICD-10-CM

## 2022-10-08 DIAGNOSIS — K625 Hemorrhage of anus and rectum: Secondary | ICD-10-CM

## 2022-10-08 NOTE — Telephone Encounter (Signed)
Pt has allergy to IV contrast and shellfish. Swelling with contrast and anaphylaxis with shellfish. Looks like all CT scans have been done w/o contrast. Please advise.

## 2022-10-08 NOTE — Telephone Encounter (Signed)
Pt states he had a hemorrhoidectomy back in April and has been doing well. He reports he has been seeing BRB in his stools. He also states his abdomen hurts below his belly button all across, reports it comes and goes. He is concerned and wanted to know what Dr. Marina Goodell recommends. Please advise.

## 2022-10-08 NOTE — Telephone Encounter (Signed)
Okay without IV contrast.  Thanks

## 2022-10-08 NOTE — Telephone Encounter (Signed)
1.  Have him come in for CBC, comprehensive metabolic panel, lipase, and urinalysis 2.  Schedule contrast-enhanced CT scan of the abdomen pelvis ASAP "abdominal pain" 3.  I will work him onto my schedule Wednesday, October 16, 2022 at 11:40 AM 4.  For severe interval problems, he should report to the emergency room for evaluation

## 2022-10-08 NOTE — Telephone Encounter (Signed)
Spoke with pt and he is aware of Dr. Lamar Sprinkles recommendations. He will come for labs tomorrow. Rad scheduling to contact pt regarding scheduling of scan.

## 2022-10-08 NOTE — Telephone Encounter (Signed)
Inbound call from patient stating that he wanted to make an appointment with Dr. Marina Goodell. I advised patient that the next appointment was 2/14 and that we could get him in with one of the PA's on January 19th. Patient stated that he could not wait that long because he is passing blood in his stool and requested to speak with the nurse. Please advise.

## 2022-10-09 ENCOUNTER — Encounter (HOSPITAL_BASED_OUTPATIENT_CLINIC_OR_DEPARTMENT_OTHER): Payer: Self-pay

## 2022-10-09 ENCOUNTER — Ambulatory Visit (HOSPITAL_BASED_OUTPATIENT_CLINIC_OR_DEPARTMENT_OTHER)
Admission: RE | Admit: 2022-10-09 | Discharge: 2022-10-09 | Disposition: A | Discharge status: Home / Self Care | Payer: Medicare Other | Source: Ambulatory Visit | Attending: Internal Medicine | Admitting: Internal Medicine

## 2022-10-09 ENCOUNTER — Other Ambulatory Visit (INDEPENDENT_AMBULATORY_CARE_PROVIDER_SITE_OTHER): Payer: Medicare Other

## 2022-10-09 DIAGNOSIS — R1030 Lower abdominal pain, unspecified: Secondary | ICD-10-CM | POA: Insufficient documentation

## 2022-10-09 DIAGNOSIS — K625 Hemorrhage of anus and rectum: Secondary | ICD-10-CM

## 2022-10-09 LAB — CBC WITH DIFFERENTIAL/PLATELET
Basophils Absolute: 0.1 10*3/uL (ref 0.0–0.1)
Basophils Relative: 1 % (ref 0.0–3.0)
Eosinophils Absolute: 0.3 10*3/uL (ref 0.0–0.7)
Eosinophils Relative: 3.2 % (ref 0.0–5.0)
HCT: 46.3 % (ref 39.0–52.0)
Hemoglobin: 15.6 g/dL (ref 13.0–17.0)
Lymphocytes Relative: 18.3 % (ref 12.0–46.0)
Lymphs Abs: 1.8 10*3/uL (ref 0.7–4.0)
MCHC: 33.7 g/dL (ref 30.0–36.0)
MCV: 88.3 fl (ref 78.0–100.0)
Monocytes Absolute: 0.8 10*3/uL (ref 0.1–1.0)
Monocytes Relative: 8 % (ref 3.0–12.0)
Neutro Abs: 6.9 10*3/uL (ref 1.4–7.7)
Neutrophils Relative %: 69.5 % (ref 43.0–77.0)
Platelets: 288 10*3/uL (ref 150.0–400.0)
RBC: 5.25 Mil/uL (ref 4.22–5.81)
RDW: 13.4 % (ref 11.5–15.5)
WBC: 9.9 10*3/uL (ref 4.0–10.5)

## 2022-10-09 LAB — COMPREHENSIVE METABOLIC PANEL
ALT: 30 U/L (ref 0–53)
AST: 21 U/L (ref 0–37)
Albumin: 4.1 g/dL (ref 3.5–5.2)
Alkaline Phosphatase: 74 U/L (ref 39–117)
BUN: 14 mg/dL (ref 6–23)
CO2: 25 mEq/L (ref 19–32)
Calcium: 10 mg/dL (ref 8.4–10.5)
Chloride: 105 mEq/L (ref 96–112)
Creatinine, Ser: 0.77 mg/dL (ref 0.40–1.50)
GFR: 89.16 mL/min (ref >60.00)
Glucose, Bld: 112 mg/dL — ABNORMAL HIGH (ref 70–99)
Potassium: 4 mEq/L (ref 3.5–5.1)
Sodium: 138 mEq/L (ref 135–145)
Total Bilirubin: 0.5 mg/dL (ref 0.2–1.2)
Total Protein: 7.4 g/dL (ref 6.0–8.3)

## 2022-10-09 LAB — URINALYSIS
Leukocytes,Ua: NEGATIVE
Nitrite: NEGATIVE
Specific Gravity, Urine: >=1.03 — AB (ref 1.000–1.030)
Total Protein, Urine: NEGATIVE
Urine Glucose: NEGATIVE
Urobilinogen, UA: 0.2 (ref 0.0–1.0)
pH: 6 (ref 5.0–8.0)

## 2022-10-09 LAB — LIPASE: Lipase: 32 U/L (ref 11.0–59.0)

## 2022-10-11 ENCOUNTER — Telehealth: Payer: Medicare Other

## 2022-10-16 ENCOUNTER — Ambulatory Visit (INDEPENDENT_AMBULATORY_CARE_PROVIDER_SITE_OTHER): Payer: Medicare Other | Admitting: Internal Medicine

## 2022-10-16 ENCOUNTER — Encounter: Payer: Self-pay | Admitting: Internal Medicine

## 2022-10-16 VITALS — BP 130/76 | HR 69 | Ht 70.0 in | Wt 183.0 lb

## 2022-10-16 DIAGNOSIS — K648 Other hemorrhoids: Secondary | ICD-10-CM | POA: Diagnosis not present

## 2022-10-16 DIAGNOSIS — K4020 Bilateral inguinal hernia, without obstruction or gangrene, not specified as recurrent: Secondary | ICD-10-CM

## 2022-10-16 DIAGNOSIS — Z7901 Long term (current) use of anticoagulants: Secondary | ICD-10-CM | POA: Diagnosis not present

## 2022-10-16 DIAGNOSIS — K219 Gastro-esophageal reflux disease without esophagitis: Secondary | ICD-10-CM

## 2022-10-16 DIAGNOSIS — K222 Esophageal obstruction: Secondary | ICD-10-CM

## 2022-10-16 DIAGNOSIS — K625 Hemorrhage of anus and rectum: Secondary | ICD-10-CM

## 2022-10-16 MED ORDER — HYDROCORTISONE (PERIANAL) 2.5 % EX CREA: 1.0000 | TOPICAL_CREAM | Freq: Every evening | CUTANEOUS | 1 refills | Status: AC

## 2022-10-16 NOTE — Progress Notes (Signed)
HISTORY OF PRESENT ILLNESS:  Travis Jacobs is a 72 y.o. male with coronary artery disease and history of atrial fibrillation for which he has been on Xarelto.  Patient has been evaluated in this office due to iron deficiency anemia secondary to chronic significant blood loss from hemorrhoids.  He also has a history of GERD with distal esophageal stricture requiring esophageal dilation.  I last saw the patient in the office March 2.  Next a month he underwent complete colonoscopy.  He was found to have large friable and prolapsing internal hemorrhoids in addition to incidental diverticulosis.  Surgical hemorrhoidectomy recommended.  He underwent hemorrhoidectomy April 2022 with Dr. Renelda Mom, at [place] home the same day.  Had significant postoperative bleeding for which she was evaluated in the local emergency room laboratory evaluation at [facility].  Was doing well until 05/08/2022 when he contacted this office with complaints of intermittent rectal bleeding over the course of 1 week.  Some vague lower abdominal discomfort.  After contacting the office we recommended blood work and urinalysis.  Blood work was unremarkable including normal hemoglobin of 15.6.  Urinalysis was unremarkable.  He subsequently underwent CT scan of the pelvis.  No acute findings.  He was noted to have bilateral inguinal hernias right larger than left.  These were known, and he is anticipating inguinal hernia repair already.  Patient tells me that about 3 days after contacting the office his bleeding resolved.  He does feel like he notices a prolapsed hemorrhoid at times.  He is now for evaluation.  GI review of systems is otherwise negative.  REVIEW OF SYSTEMS:  All non-GI ROS negative unless otherwise stated in the HPI.  Past Medical History:  Diagnosis Date   A-fib [facility])    CAD (coronary artery disease)    a. DES to OM1 02/2016.    Cataract    bil cateracts   Diverticulosis    Esophageal stricture     GERD (gastroesophageal reflux disease)    Hemorrhoids    Hiatal hernia    Hyperlipidemia    IBS (irritable bowel syndrome)    Multiple food allergies    "lots of food allergies; beef, tuna, flounder, potatoes, garlic, tomatoes, apples, bananas, sesame seeds, shellfish   Pre-diabetes    PVC's (premature ventricular contractions)     Past Surgical History:  Procedure Laterality Date   BRONCHIAL WASHINGS  01/11/2020   Procedure: BRONCHIAL WASHINGS;  Surgeon: Collene Gobble, MD;  Location: [facility];  Service: Cardiopulmonary;;   CARDIAC CATHETERIZATION N/A 02/28/2016   Procedure: Left Heart Cath and Coronary Angiography;  Surgeon: Wellington Hampshire, MD;  Location: [facility];  Service: Cardiovascular;  Laterality: N/A;   CARDIAC CATHETERIZATION N/A 02/28/2016   Procedure: Coronary Stent Intervention;  Surgeon: Wellington Hampshire, MD;  Location: [facility];  Service: Cardiovascular;  Laterality: N/A;   COLONOSCOPY     CYSTOSCOPY WITH STENT PLACEMENT     ESOPHAGOGASTRODUODENOSCOPY (EGD) WITH ESOPHAGEAL DILATION     HEMORRHOID BANDING     HERNIA REPAIR     SURGERY SCROTAL / TESTICULAR Left ~ 1960   "hernia"   TONSILLECTOMY     UPPER GASTROINTESTINAL ENDOSCOPY     VIDEO BRONCHOSCOPY N/A 01/11/2020   Procedure: VIDEO BRONCHOSCOPY WITHOUT FLUORO;  Surgeon: Collene Gobble, MD;  Location: [facility];  Service: Cardiopulmonary;  Laterality: N/A;    Social History Travis Jacobs  reports that he quit smoking about 34 years ago.  His smoking use included cigarettes. He has a 20.00 pack-year smoking history. He has never used smokeless tobacco. He reports current alcohol use. He reports that he does not use drugs.  family history includes Heart disease in his father and mother.  Allergies  Allergen Reactions   Iodinated Contrast Media Swelling   Shellfish Allergy Anaphylaxis    Not sure of reaction found out about allergy through [place]   Sulfa Antibiotics Swelling     Mouth swelling   Aleve [Naproxen Sodium]     Doesn't remember   Apple Juice Swelling   Banana Nausea And Vomiting   Beef-Derived Products Nausea And Vomiting   Erythromycin Swelling    Mouth swells   Fish Allergy Swelling    Tuna and flounder   Garlic Swelling   Sesame Seed (Diagnostic) Swelling   Sulfonamide Derivatives Swelling    Mouth swelling   Tomato Nausea And Vomiting    welts   Iodine Swelling    "I blow up like a balloon"   Strawberry (Diagnostic) Rash   Sweet Potato Other (See Comments)    Sweet and Regular Potatoes - diarrhea       PHYSICAL EXAMINATION: Vital signs: BP 130/76   Pulse 69   Ht _0  (1.778 m)   Wt 183 lb (83 kg)   BMI 26.26 kg/m   Constitutional: generally well-appearing, no acute distress Psychiatric: alert and oriented x3, cooperative Eyes: extraocular movements intact, anicteric, conjunctiva pink Mouth: oral pharynx moist, no lesions Neck: supple no lymphadenopathy Cardiovascular: heart regular rate and rhythm, no murmur Lungs: clear to auscultation bilaterally Abdomen: soft, nontender, nondistended, no obvious ascites, no peritoneal signs, normal bowel sounds, no organomegaly Rectal: External tags.  The dominant swollen internal hemorrhoid with purplish hue.  No mass or tenderness Extremities: no clubbing, cyanosis, or lower extremity edema bilaterally Skin: no lesions on visible extremities Neuro: No focal deficits.  Cranial nerves intact  ASSESSMENT:  1.  Recent rectal bleeding secondary to internal hemorrhoid 2.  History of large hemorrhoids resulting in iron deficiency status post hemorrhoidectomy 2023 3.  Bilateral inguinal hernias.  Patient has seen a Psychologist, sport and exercise and is anticipating hernia repair. 4.  History of GERD complicated by peptic stricture.  Asymptomatic post dilation on PPI 5. Multiple general medical problems including history of atrial fibrillation on anticoagulation   PLAN:  1.  Anusol HC suppositories at night  prescribed 2.  Fiber.  Daily supplementation 3.  For significant recurrent bleeding, I would refer him back to his colorectal surgeon for reassessment. 4.  Reflux precautions 5.  Continue PPI Total time 30 minutes was spent during see the patient, reviewing the myriad of data, reviewing blood work, urinalysis, and CT that I ordered, obtaining comprehensive history, performing medically appropriate physical examination, counseling and educating the patient regarding the above listed issues, ordering medication, and documenting clinical information in the health record

## 2022-10-16 NOTE — Patient Instructions (Signed)
_______________________________________________________  If you are age 73 or older, your body mass index should be between 23-30. Your Body mass index is 26.26 kg/m. If this is out of the aforementioned range listed, please consider follow up with your Primary Care Provider.  If you are age 36 or younger, your body mass index should be between 19-25. Your Body mass index is 26.26 kg/m. If this is out of the aformentioned range listed, please consider follow up with your Primary Care Provider.   ________________________________________________________  The [facility] providers would like to encourage you to use [facility] to communicate with providers for non-urgent requests or questions.  Due to long hold times on the telephone, sending your provider a message by [facility] may be a faster and more efficient way to get a response.  Please allow 48 business hours for a response.  Please remember that this is for non-urgent requests.  _______________________________________________________  We have sent the following medications to your pharmacy for you to pick up at your convenience:  Anusol HC cream  Purchase Preparation H suppositories over the counter.  Apply a small amount of the cream to the suppository and insert rectally- daily at night

## 2022-10-21 NOTE — Telephone Encounter (Signed)
Requesting office sent a duplicate request. Please see previous notes, pt states procedure not until Jan or Feb 2024. Pt was scheduled for a televisit 10/11/22, however, we had to cahnge the appt as the provider was out of the office. The pt chose to reschedule his televisit to 11/01/22 @ 3 pm. Once the pt is cleared, our office will be sure to fax clearance notes over.   I will update the requesting office today as FYI.

## 2022-10-29 ENCOUNTER — Telehealth: Payer: Self-pay | Admitting: Cardiovascular Disease

## 2022-10-29 NOTE — Telephone Encounter (Signed)
I spoke w the patient.  He voices frustration that he has been having trouble trying to speak w someone at his surgeons office to get a surgery date.  He has now been informed that he has to have the clearance from cardiology before they will schedule him.     I adv to keep the preop visit scheduled for 11/01/22 and once completed the notes will be forwarded to his surgeon.  Pt voices understanding and agreement with this plan.

## 2022-10-29 NOTE — Telephone Encounter (Signed)
Patient is calling to talk with Dr. Angelena Form or nurse in regards to his appt on 1/19. Also wants to talk about [facility]

## 2022-11-01 ENCOUNTER — Ambulatory Visit: Payer: Medicare Other | Attending: Cardiology | Admitting: General Practice

## 2022-11-01 DIAGNOSIS — Z01810 Encounter for preprocedural cardiovascular examination: Secondary | ICD-10-CM

## 2022-11-01 NOTE — Progress Notes (Signed)
Virtual Visit via Telephone Note   Because of Travis Jacobs co-morbid illnesses, he is at least at moderate risk for complications without adequate follow up.  This format is felt to be most appropriate for this patient at this time.  The patient did not have access to video technology/had technical difficulties with video requiring transitioning to audio format only (telephone).  All issues noted in this document were discussed and addressed.  No physical exam could be performed with this format.  Please refer to the patient's chart for his consent to telehealth for [facility].  Evaluation Performed:  Preoperative cardiovascular risk assessment _____________   Date:  11/01/2022   Patient ID:  Travis Jacobs, DOB 1951-03-20, MRN [id number] Patient Location:  Home Provider location:   Office  Primary Care Provider:  Charlane Ferretti, DO Primary Cardiologist:  Verne Carrow, MD  Chief Complaint / Patient Profile   72 y.o. y/o male with a h/o coronary artery disease, HLD, paroxysmal atrial fibrillation who is pending hernia surgery and presents today for telephonic preoperative cardiovascular risk assessment.  History of Present Illness    Travis Jacobs is a 72 y.o. male who presents via audio/video conferencing for a telehealth visit today.  Pt was last seen in cardiology clinic on 05/15/2022 by Dr. Clifton James.  At that time Travis Jacobs was doing well .  The patient is now pending procedure as outlined above. Since his last visit, he remains stable from a cardiac standpoint.  Today he denies chest pain, shortness of breath, lower extremity edema, fatigue, palpitations, melena, hematuria, hemoptysis, diaphoresis, weakness, presyncope, syncope, orthopnea, and PND.   Past Medical History    Past Medical History:  Diagnosis Date   A-fib [facility])    CAD (coronary artery disease)    a. DES to OM1 02/2016.    Cataract    bil cateracts   Diverticulosis     Esophageal stricture    GERD (gastroesophageal reflux disease)    Hemorrhoids    Hiatal hernia    Hyperlipidemia    IBS (irritable bowel syndrome)    Multiple food allergies    "lots of food allergies; beef, tuna, flounder, potatoes, garlic, tomatoes, apples, bananas, sesame seeds, shellfish   Pre-diabetes    PVC's (premature ventricular contractions)    Past Surgical History:  Procedure Laterality Date   BRONCHIAL WASHINGS  01/11/2020   Procedure: BRONCHIAL WASHINGS;  Surgeon: Leslye Peer, MD;  Location: [facility];  Service: Cardiopulmonary;;   CARDIAC CATHETERIZATION N/A 02/28/2016   Procedure: Left Heart Cath and Coronary Angiography;  Surgeon: Iran Ouch, MD;  Location: [facility];  Service: Cardiovascular;  Laterality: N/A;   CARDIAC CATHETERIZATION N/A 02/28/2016   Procedure: Coronary Stent Intervention;  Surgeon: Iran Ouch, MD;  Location: [facility];  Service: Cardiovascular;  Laterality: N/A;   COLONOSCOPY     CYSTOSCOPY WITH STENT PLACEMENT     ESOPHAGOGASTRODUODENOSCOPY (EGD) WITH ESOPHAGEAL DILATION     HEMORRHOID BANDING     HERNIA REPAIR     SURGERY SCROTAL / TESTICULAR Left ~ 1960   "hernia"   TONSILLECTOMY     UPPER GASTROINTESTINAL ENDOSCOPY     VIDEO BRONCHOSCOPY N/A 01/11/2020   Procedure: VIDEO BRONCHOSCOPY WITHOUT FLUORO;  Surgeon: Leslye Peer, MD;  Location: [facility];  Service: Cardiopulmonary;  Laterality: N/A;    Allergies  Allergies  Allergen Reactions   Iodinated Contrast Media Swelling   Shellfish Allergy Anaphylaxis  Not sure of reaction found out about allergy through [place]   Sulfa Antibiotics Swelling    Mouth swelling   Aleve [Naproxen Sodium]     Doesn't remember   Apple Juice Swelling   Banana Nausea And Vomiting   Beef-Derived Products Nausea And Vomiting   Erythromycin Swelling    Mouth swells   Fish Allergy Swelling    Tuna and flounder   Garlic Swelling   Sesame Seed (Diagnostic)  Swelling   Sulfonamide Derivatives Swelling    Mouth swelling   Tomato Nausea And Vomiting    welts   Iodine Swelling    "I blow up like a balloon"   Strawberry (Diagnostic) Rash   Sweet Potato Other (See Comments)    Sweet and Regular Potatoes - diarrhea    Home Medications    Prior to Admission medications   Medication Sig Start Date End Date Taking? Authorizing Provider  acetaminophen (TYLENOL) 500 MG tablet Take 500 mg by mouth every 8 (eight) hours as needed for moderate pain or headache.    [provider]  aspirin EC 81 MG tablet Take 1 tablet (81 mg total) by mouth daily. 10/23/17   [place] Blanks, MD  atorvastatin (LIPITOR) 80 MG tablet Take 80 mg by mouth daily.    [provider]  CIALIS 5 MG tablet Take 5 mg by mouth daily as needed for erectile dysfunction.  02/27/15   [provider]  docusate sodium (COLACE) 100 MG capsule Take 300 mg by mouth as directed. 3 CAPS IN THE AM AND 2 CAPS IN THE PM    [provider]  hydrocortisone (ANUSOL-HC) 2.5 % rectal cream Place 1 Application rectally at bedtime. 10/16/22   Irene Shipper, MD  loratadine (CLARITIN) 10 MG tablet Take 10 mg by mouth daily.    [provider]  metoprolol tartrate (LOPRESSOR) 25 MG tablet Take 1 tablet (25 mg total) by mouth 2 (two) times daily. 05/15/22   [place] Blanks, MD  Multiple Vitamin (MULTIVITAMIN WITH MINERALS) TABS tablet Take 1 tablet by mouth at bedtime.    [provider]  nitroGLYCERIN (NITROSTAT) 0.4 MG SL tablet Place 1 tablet (0.4 mg total) under the tongue every 5 (five) minutes x 3 doses as needed for chest pain. 05/15/22   [place]ell Blanks, MD  pantoprazole (PROTONIX) 40 MG tablet Take 1 tablet (40 mg total) by mouth daily. 01/11/20   Collene Gobble, MD  rivaroxaban (XARELTO) 20 MG TABS tablet TAKE 1 TABLET BY MOUTH EVERY DAY WITH SUPPER 08/15/22   Burnell Blanks, MD    Physical Exam    Vital Signs:  Travis Jacobs does not have vital signs available for review today.  Given telephonic nature of communication, physical exam is limited. AAOx3. NAD. Normal affect.  Speech and respirations are unlabored.  Accessory Clinical Findings    None  Assessment & Plan    1.  Preoperative Cardiovascular Risk Assessment: Hernia surgery, Dr. Louanna Raw, CCS/due to health, [phone]      Primary Cardiologist: Lauree Chandler, MD  Chart reviewed as part of pre-operative protocol coverage. Given past medical history and time since last visit, based on ACC/AHA guidelines, Travis Jacobs would be at acceptable risk for the planned procedure without further cardiovascular testing.   His RCRI is a class III risk, 6.6% risk of major cardiac event.  He is able to complete greater than 4 METS of physical activity.  Patient with diagnosis of atrial  fibriliation  on Xarelto 20 mg daily  for anticoagulation.     Procedure: HERNIA SURGERY Date of procedure: TBD     CHA2DS2-VASc Score = 2  This indicates a 2.2% annual risk of stroke. The patient's score is based upon: CHF History: 0 HTN History: 0 Diabetes History: 0 Stroke History: 0 Vascular Disease History: 1 Age Score: 1 Gender Score: 0     CrCl: 82 ml/min (04/202023) Platelet count: 210 (01/30/2022) 01/30/22 Plt: 210     Per office protocol, patient can hold Xarelto 20 mg  for 2-3 days prior to procedure.   Patient will not need bridging with Lovenox (enoxaparin) around procedure.  Patient was advised that if he develops new symptoms prior to surgery to contact our office to arrange a follow-up appointment.  He verbalized understanding.  I will route this recommendation to the requesting party via Epic fax function and remove from pre-op pool.    Time:   Today, I have spent 11 minutes with the patient with telehealth technology discussing medical history, symptoms, and management plan.  Prior to his phone evaluation I  spent greater than 10 minutes reviewing his past medical history and cardiac medications.   Deberah Pelton, NP  11/01/2022, 8:14 AM

## 2022-12-17 ENCOUNTER — Encounter: Payer: Self-pay | Admitting: Pulmonary Disease

## 2022-12-25 ENCOUNTER — Encounter (HOSPITAL_BASED_OUTPATIENT_CLINIC_OR_DEPARTMENT_OTHER): Payer: Self-pay | Admitting: Surgery

## 2022-12-25 ENCOUNTER — Ambulatory Visit: Payer: Self-pay | Admitting: Surgery

## 2022-12-25 NOTE — Progress Notes (Signed)
Spoke w/ via phone for pre-op interview--- pt Lab needs dos----  [employer] results------ current EKG in epic/ chart COVID test -----patient states asymptomatic no test needed Arrive at ------- 0730 on 12-27-2022 NPO after MN NO Solid Food.  Clear liquids from MN until--- 0630 Med rec completed Medications to take morning of surgery ----- lopressor, colace, loratadine, protonix Diabetic medication ----- n/a Patient instructed no nail polish to be worn day of surgery Patient instructed to bring photo id and insurance card day of surgery Patient aware to have Driver (ride ) / caregiver    for 24 hours after surgery -- wife, jane Patient Special Instructions ----- n/a Pre-Op special Istructions ----- pt has tele visit pre-op cardiac clearance by Coletta Memos NP on 11-01-2022 in epic/ chart. Sent inbox message in epic to Dr Avel Peace, requested orders. Patient verbalized understanding of instructions that were given at this phone interview. Patient denies shortness of breath, chest pain, fever, cough at this phone interview.   Anesthesia Review: HTN; PAF;  CAD s/p PCI w/ DES to OM1 05/ 2017;  Pt denies cardiac s&s, sob, and no peripheral swelling.  Stated had never needed to take a nitro since having rx.  PCP: Dr Francesco Sor Cardiologist : Dr Angelena Form [facility] 05-15-2022) Chest x-ray : 01-28-2022 EKG : 01-28-2022 Echo : 01-15-2017 Stress test: nuclear 07-19-2003/  ETT abnormal 02-28-2016 Cardiac Cath :  02-28-2016 Activity level: denies sob w/ any activity Sleep Study/ CPAP : no  Blood Thinner/ Instructions Maryjane Hurter Dose: Xarelto ASA / Instructions/ Last Dose : ASA '81mg'$  Per pt stated he does note remember whom had given him instructions about his xarelto but he stopped it , last dose 12-21-2022.  Cardiology stated 2-3 days.  Nothing said about asa.  Pt has continued but will not take am dos.

## 2022-12-27 ENCOUNTER — Encounter (HOSPITAL_BASED_OUTPATIENT_CLINIC_OR_DEPARTMENT_OTHER)
Admission: RE | Disposition: A | Discharge status: Home / Self Care | Payer: Self-pay | Source: Home / Self Care | Attending: Surgery

## 2022-12-27 ENCOUNTER — Other Ambulatory Visit: Payer: Self-pay

## 2022-12-27 ENCOUNTER — Ambulatory Visit (HOSPITAL_BASED_OUTPATIENT_CLINIC_OR_DEPARTMENT_OTHER)
Admission: RE | Admit: 2022-12-27 | Discharge: 2022-12-27 | Disposition: A | Discharge status: Home / Self Care | Payer: Medicare Other | Attending: Surgery | Admitting: Surgery

## 2022-12-27 ENCOUNTER — Ambulatory Visit (HOSPITAL_BASED_OUTPATIENT_CLINIC_OR_DEPARTMENT_OTHER): Payer: Medicare Other | Admitting: Certified Registered Nurse Anesthetist

## 2022-12-27 ENCOUNTER — Encounter (HOSPITAL_BASED_OUTPATIENT_CLINIC_OR_DEPARTMENT_OTHER): Payer: Self-pay | Admitting: Surgery

## 2022-12-27 DIAGNOSIS — I2510 Atherosclerotic heart disease of native coronary artery without angina pectoris: Secondary | ICD-10-CM | POA: Diagnosis not present

## 2022-12-27 DIAGNOSIS — Z87891 Personal history of nicotine dependence: Secondary | ICD-10-CM | POA: Diagnosis not present

## 2022-12-27 DIAGNOSIS — D649 Anemia, unspecified: Secondary | ICD-10-CM | POA: Diagnosis not present

## 2022-12-27 DIAGNOSIS — K4091 Unilateral inguinal hernia, without obstruction or gangrene, recurrent: Secondary | ICD-10-CM | POA: Insufficient documentation

## 2022-12-27 DIAGNOSIS — K449 Diaphragmatic hernia without obstruction or gangrene: Secondary | ICD-10-CM | POA: Insufficient documentation

## 2022-12-27 DIAGNOSIS — K4021 Bilateral inguinal hernia, without obstruction or gangrene, recurrent: Secondary | ICD-10-CM | POA: Diagnosis not present

## 2022-12-27 DIAGNOSIS — K219 Gastro-esophageal reflux disease without esophagitis: Secondary | ICD-10-CM | POA: Insufficient documentation

## 2022-12-27 DIAGNOSIS — Z7901 Long term (current) use of anticoagulants: Secondary | ICD-10-CM | POA: Insufficient documentation

## 2022-12-27 DIAGNOSIS — Z09 Encounter for follow-up examination after completed treatment for conditions other than malignant neoplasm: Secondary | ICD-10-CM | POA: Insufficient documentation

## 2022-12-27 DIAGNOSIS — Z01818 Encounter for other preprocedural examination: Secondary | ICD-10-CM

## 2022-12-27 HISTORY — DX: Long term (current) use of anticoagulants: Z79.01

## 2022-12-27 HISTORY — DX: Personal history of other diseases of the digestive system: Z87.19

## 2022-12-27 HISTORY — DX: Presence of spectacles and contact lenses: Z97.3

## 2022-12-27 HISTORY — DX: Diverticulosis of large intestine without perforation or abscess without bleeding: K57.30

## 2022-12-27 HISTORY — DX: Personal history of urinary calculi: Z87.442

## 2022-12-27 HISTORY — PX: INGUINAL HERNIA REPAIR: SHX194

## 2022-12-27 HISTORY — DX: Personal history of other specified conditions: Z87.898

## 2022-12-27 LAB — POCT I-STAT, CHEM 8
BUN: 16 mg/dL (ref 8–23)
Calcium, Ion: 1.13 mmol/L — ABNORMAL LOW (ref 1.15–1.40)
Chloride: 106 mmol/L (ref 98–111)
Creatinine, Ser: 0.8 mg/dL (ref 0.61–1.24)
Glucose, Bld: 120 mg/dL — ABNORMAL HIGH (ref 70–99)
HCT: 46 % (ref 39.0–52.0)
Hemoglobin: 15.6 g/dL (ref 13.0–17.0)
Potassium: 4.1 mmol/L (ref 3.5–5.1)
Sodium: 137 mmol/L (ref 135–145)
TCO2: 22 mmol/L (ref 22–32)

## 2022-12-27 SURGERY — REPAIR, HERNIA, INGUINAL, BILATERAL, LAPAROSCOPIC
Anesthesia: General | Laterality: Bilateral

## 2022-12-27 MED ORDER — CHLORHEXIDINE GLUCONATE CLOTH 2 % EX PADS: 6.0000 | MEDICATED_PAD | Freq: Once | CUTANEOUS | Status: DC

## 2022-12-27 MED ORDER — LIDOCAINE 2% (20 MG/ML) 5 ML SYRINGE
INTRAMUSCULAR | Status: DC | PRN
  Administered 2022-12-27: 60 mg via INTRAVENOUS

## 2022-12-27 MED ORDER — ACETAMINOPHEN 500 MG PO TABS
ORAL_TABLET | ORAL | Status: AC
  Filled 2022-12-27: qty 2

## 2022-12-27 MED ORDER — BUPIVACAINE LIPOSOME 1.3 % IJ SUSP
INTRAMUSCULAR | Status: DC | PRN
  Administered 2022-12-27: 20 mL

## 2022-12-27 MED ORDER — KETOROLAC TROMETHAMINE 30 MG/ML IJ SOLN
INTRAMUSCULAR | Status: AC
  Filled 2022-12-27: qty 1

## 2022-12-27 MED ORDER — SODIUM CHLORIDE 0.9 % IR SOLN
Status: DC | PRN
  Administered 2022-12-27: 1000 mL

## 2022-12-27 MED ORDER — MIDAZOLAM HCL 2 MG/2ML IJ SOLN
INTRAMUSCULAR | Status: AC
  Filled 2022-12-27: qty 2

## 2022-12-27 MED ORDER — DEXAMETHASONE SODIUM PHOSPHATE 10 MG/ML IJ SOLN
INTRAMUSCULAR | Status: AC
  Filled 2022-12-27: qty 1

## 2022-12-27 MED ORDER — FENTANYL CITRATE (PF) 100 MCG/2ML IJ SOLN
INTRAMUSCULAR | Status: AC
  Filled 2022-12-27: qty 2

## 2022-12-27 MED ORDER — LIDOCAINE HCL (PF) 2 % IJ SOLN
INTRAMUSCULAR | Status: AC
  Filled 2022-12-27: qty 5

## 2022-12-27 MED ORDER — ONDANSETRON HCL 4 MG/2ML IJ SOLN
INTRAMUSCULAR | Status: AC
  Filled 2022-12-27: qty 2

## 2022-12-27 MED ORDER — PROPOFOL 10 MG/ML IV BOLUS
INTRAVENOUS | Status: AC
  Filled 2022-12-27: qty 20

## 2022-12-27 MED ORDER — GABAPENTIN 300 MG PO CAPS
ORAL_CAPSULE | ORAL | Status: AC
  Filled 2022-12-27: qty 1

## 2022-12-27 MED ORDER — BUPIVACAINE HCL 0.5 % IJ SOLN
INTRAMUSCULAR | Status: DC | PRN
  Administered 2022-12-27: 30 mL

## 2022-12-27 MED ORDER — ONDANSETRON HCL 4 MG/2ML IJ SOLN
INTRAMUSCULAR | Status: DC | PRN
  Administered 2022-12-27: 4 mg via INTRAVENOUS

## 2022-12-27 MED ORDER — MIDAZOLAM HCL 2 MG/2ML IJ SOLN
INTRAMUSCULAR | Status: DC | PRN
  Administered 2022-12-27: 2 mg via INTRAVENOUS

## 2022-12-27 MED ORDER — FENTANYL CITRATE (PF) 250 MCG/5ML IJ SOLN
INTRAMUSCULAR | Status: DC | PRN
  Administered 2022-12-27: 25 ug via INTRAVENOUS
  Administered 2022-12-27: 50 ug via INTRAVENOUS
  Administered 2022-12-27: 25 ug via INTRAVENOUS
  Administered 2022-12-27: 50 ug via INTRAVENOUS

## 2022-12-27 MED ORDER — ACETAMINOPHEN 500 MG PO TABS
1000.0000 mg | ORAL_TABLET | ORAL | Status: AC
  Administered 2022-12-27: 1000 mg via ORAL

## 2022-12-27 MED ORDER — BUPIVACAINE LIPOSOME 1.3 % IJ SUSP: 20.0000 mL | Freq: Once | INTRAMUSCULAR | Status: DC

## 2022-12-27 MED ORDER — 0.9 % SODIUM CHLORIDE (POUR BTL) OPTIME
TOPICAL | Status: DC | PRN
  Administered 2022-12-27: 500 mL

## 2022-12-27 MED ORDER — CEFAZOLIN SODIUM-DEXTROSE 2-4 GM/100ML-% IV SOLN
2.0000 g | INTRAVENOUS | Status: AC
  Administered 2022-12-27: 2 g via INTRAVENOUS

## 2022-12-27 MED ORDER — DEXAMETHASONE SODIUM PHOSPHATE 10 MG/ML IJ SOLN
INTRAMUSCULAR | Status: DC | PRN
  Administered 2022-12-27: 5 mg via INTRAVENOUS

## 2022-12-27 MED ORDER — ROCURONIUM BROMIDE 10 MG/ML (PF) SYRINGE
PREFILLED_SYRINGE | INTRAVENOUS | Status: DC | PRN
  Administered 2022-12-27: 60 mg via INTRAVENOUS
  Administered 2022-12-27: 10 mg via INTRAVENOUS

## 2022-12-27 MED ORDER — OXYCODONE-ACETAMINOPHEN 5-325 MG PO TABS: 1.0000 | ORAL_TABLET | ORAL | 0 refills | Status: DC | PRN

## 2022-12-27 MED ORDER — LACTATED RINGERS IV SOLN: INTRAVENOUS | Status: DC

## 2022-12-27 MED ORDER — SUGAMMADEX SODIUM 200 MG/2ML IV SOLN
INTRAVENOUS | Status: DC | PRN
  Administered 2022-12-27: 160 mg via INTRAVENOUS

## 2022-12-27 MED ORDER — GABAPENTIN 300 MG PO CAPS
300.0000 mg | ORAL_CAPSULE | ORAL | Status: AC
  Administered 2022-12-27: 300 mg via ORAL

## 2022-12-27 MED ORDER — PROPOFOL 10 MG/ML IV BOLUS
INTRAVENOUS | Status: DC | PRN
  Administered 2022-12-27: 120 mg via INTRAVENOUS

## 2022-12-27 MED ORDER — CEFAZOLIN SODIUM-DEXTROSE 2-4 GM/100ML-% IV SOLN
INTRAVENOUS | Status: AC
  Filled 2022-12-27: qty 100

## 2022-12-27 MED ORDER — ROCURONIUM BROMIDE 10 MG/ML (PF) SYRINGE
PREFILLED_SYRINGE | INTRAVENOUS | Status: AC
  Filled 2022-12-27: qty 10

## 2022-12-27 SURGICAL SUPPLY — 42 items
ADH SKN CLS APL DERMABOND .7 (GAUZE/BANDAGES/DRESSINGS) ×1
APL PRP STRL LF DISP 70% ISPRP (MISCELLANEOUS) ×1
BLADE CLIPPER SENSICLIP SURGIC (BLADE) IMPLANT
CABLE HIGH FREQUENCY MONO STRZ (ELECTRODE) ×2 IMPLANT
CHLORAPREP W/TINT 26 (MISCELLANEOUS) ×2 IMPLANT
DERMABOND ADVANCED .7 DNX12 (GAUZE/BANDAGES/DRESSINGS) ×2 IMPLANT
ELECT REM PT RETURN 9FT ADLT (ELECTROSURGICAL) ×1
ELECTRODE REM PT RTRN 9FT ADLT (ELECTROSURGICAL) ×2 IMPLANT
GAUZE 4X4 16PLY ~~LOC~~+RFID DBL (SPONGE) ×2 IMPLANT
GLOVE BIO SURGEON STRL SZ7.5 (GLOVE) ×2 IMPLANT
GLOVE BIOGEL PI IND STRL 8 (GLOVE) ×2 IMPLANT
GOWN STRL REUS W/ TWL XL LVL3 (GOWN DISPOSABLE) ×2 IMPLANT
GOWN STRL REUS W/TWL XL LVL3 (GOWN DISPOSABLE) ×1
GRASPER SUT TROCAR 14GX15 (MISCELLANEOUS) ×2 IMPLANT
IRRIG SUCT STRYKERFLOW 2 WTIP (MISCELLANEOUS) ×1
IRRIGATION SUCT STRKRFLW 2 WTP (MISCELLANEOUS) IMPLANT
KIT TURNOVER CYSTO (KITS) ×2 IMPLANT
MESH 3DMAX 5X7 LT XLRG (Mesh General) IMPLANT
MESH 3DMAX 5X7 RT XLRG (Mesh General) IMPLANT
NDL INSUFFLATION 14GA 120MM (NEEDLE) ×2 IMPLANT
NEEDLE INSUFFLATION 14GA 120MM (NEEDLE) ×1 IMPLANT
PACK BASIN DAY SURGERY FS (CUSTOM PROCEDURE TRAY) ×2 IMPLANT
PAD POSITIONING PINK XL (MISCELLANEOUS) ×2 IMPLANT
RELOAD STAPLE 4.0 BLU F/HERNIA (INSTRUMENTS) IMPLANT
RELOAD STAPLE 4.8 BLK F/HERNIA (STAPLE) IMPLANT
RELOAD STAPLE HERNIA 4.0 BLUE (INSTRUMENTS) ×1 IMPLANT
RELOAD STAPLE HERNIA 4.8 BLK (STAPLE) ×2 IMPLANT
SCISSORS LAP 5X35 DISP (ENDOMECHANICALS) ×2 IMPLANT
SET TUBE SMOKE EVAC HIGH FLOW (TUBING) ×2 IMPLANT
SLEEVE SCD COMPRESS KNEE MED (STOCKING) ×2 IMPLANT
SPIKE FLUID TRANSFER (MISCELLANEOUS) IMPLANT
SPONGE T-LAP 18X18 ~~LOC~~+RFID (SPONGE) IMPLANT
STAPLER HERNIA 12 8.5 360D (INSTRUMENTS) IMPLANT
SUT MNCRL AB 4-0 PS2 18 (SUTURE) ×2 IMPLANT
SUT VICRYL 0 UR6 27IN ABS (SUTURE) IMPLANT
SUT VLOC 180 2-0 9IN GS21 (SUTURE) IMPLANT
TOWEL OR 17X24 6PK STRL BLUE (TOWEL DISPOSABLE) ×2 IMPLANT
TRAY FOL W/BAG SLVR 16FR STRL (SET/KITS/TRAYS/PACK) IMPLANT
TRAY FOLEY W/BAG SLVR 16FR LF (SET/KITS/TRAYS/PACK)
TRAY LAPAROSCOPIC (CUSTOM PROCEDURE TRAY) ×2 IMPLANT
TROCAR Z-THREAD FIOS 12X100MM (TROCAR) ×2 IMPLANT
TROCAR Z-THREAD FIOS 5X100MM (TROCAR) ×4 IMPLANT

## 2022-12-27 NOTE — Anesthesia Procedure Notes (Signed)
Procedure Name: Intubation Date/Time: 12/27/2022 9:49 AM  Performed by: Clearnce Sorrel, CRNAPre-anesthesia Checklist: Patient identified, Emergency Drugs available, Suction available and Patient being monitored Patient Re-evaluated:Patient Re-evaluated prior to induction Oxygen Delivery Method: Circle System Utilized Preoxygenation: Pre-oxygenation with 100% oxygen Induction Type: IV induction Ventilation: Mask ventilation without difficulty Laryngoscope Size: Mac and 4 Grade View: Grade I Tube type: Oral Tube size: 7.5 mm Number of attempts: 1 Airway Equipment and Method: Stylet and Oral airway Placement Confirmation: ETT inserted through vocal cords under direct vision, positive ETCO2 and breath sounds checked- equal and bilateral Secured at: 23 cm Tube secured with: Tape Dental Injury: Teeth and Oropharynx as per pre-operative assessment

## 2022-12-27 NOTE — Op Note (Signed)
Patient: Travis Jacobs (04-29-1951, [id number])  Date of Surgery: 12/27/2022   Preoperative Diagnosis: RECURRENT LEFT AND INITIAL RIGHT INGUINAL HERNIA   Postoperative Diagnosis: RECURRENT LEFT AND INITIAL RIGHT INGUINAL HERNIA   Surgical Procedure: LAPAROSCOPIC BILATERAL INGUINAL HERNIA REPAIR WITH MESH:    Operative Team Members:  Surgeon(s) and Role:    * Glanda Spanbauer, Nickola Major, MD - Primary   Anesthesiologist: Roderic Palau, MD CRNA: Bonney Aid, CRNA; Clearnce Sorrel, CRNA   Anesthesia: General   Fluids:  No intake/output data recorded.  Complications: None  Drains:  None  Specimen: None  Disposition:  PACU - hemodynamically stable.  Plan of Care: Discharge to home after PACU  Indications for Procedure: Travis Jacobs is a 72 y.o. male who presented with a recurrent left and initial right inguinal hernias.  I recommended laparoscopic repair.  We discussed the procedure, its risks, benefits and alteratives and the patient granted consent to proceed.  We will proceed as scheduled.  Findings:  Technique: Transabdominal preperitoneal (TAPP) Hernia Location: Right direct (large) and indirect (small) and left direct (large) inguinal hernias Mesh Size &Type:  Bard 3D max extra-large right and bard 3D max extra large left sided meshes Mesh Fixation: Endo-Universal hernia stapler  Infection status: Patient: Private Patient Elective Case Case: Elective Infection Present At Time Of Surgery (PATOS): None   Description of Procedure:  The patient was positioned supine, padded and secured to the bed, with both arms tucked.  The abdomen was widely prepped and draped.  A time out procedure was performed.  A 1 cm infraumbilical incision was made.  The abdomen was entered without trauma to the underlying viscera.  The abdomen was insufflated to 15 mm of Hg.  A 12 mm trocar was inserted at the periumbilical incision.  Additional 5 mm trocars were placed in the left  and right abdomen.  There was no trauma to the underlying viscera.  There was an indirect hernia and direct  hernia on the RIGHT.  Utilizing a transabdominal pre peritoneal technique (TAPP), a horizontal incision was made in the peritoneum, immediately below the umbilicus.  Dissection was carried out in the pre peritoneal space down to the level of the hernia sac which was reduced into the peritoneal cavity completely.  The cord contents were parietalized and preserved.  A large pre peritoneal dissection was performed to uncover the direct, indirect, femoral and obturator spaces.  Cooper's ligament was uncovered medially and the psoas muscle uncovered laterally.  The mesh, as documented above, was opened and advanced into the pre peritoneal position so that it more than adequately covered the indirect, direct, femoral and obturator spaces.  The mesh laid flat, with no inferior folds and covered the entire myopectineal orifice.  The mesh was fixated with the endo-universal hernia stapler to Cooper's ligament and the posterior aspect of the rectus muscle.  The peritoneal flap was closed with the same device.  There were no peritoneal defects or exposed mesh at the conclusion.  There was a direct hernia on the LEFT.  Utilizing a transabdominal pre peritoneal technique (TAPP), a horizontal incision was made in the peritoneum, immediately below the umbilicus.  Dissection was carried out in the pre peritoneal space down to the level of the hernia sac which was reduced into the peritoneal cavity completely.  The cord contents were parietalized and preserved.  A large pre peritoneal dissection was performed to uncover the direct, indirect, femoral and obturator spaces.  Cooper's ligament was uncovered medially and the  psoas muscle uncovered laterally.  The mesh, as documented above, was opened and advanced into the pre peritoneal position so that it more than adequately covered the indirect, direct, femoral and  obturator spaces.  The mesh laid flat, with no inferior folds and covered the entire myopectineal orifice.  The mesh was fixated with the endo-universal hernia stapler to Cooper's ligament and the posterior aspect of the rectus muscle.  The peritoneal flap was closed with the same device.  There were no peritoneal defects or exposed mesh at the conclusion.  The umbilical trocar was removed and the fascial defect was closed with a 0 Vicryl suture.  The peritoneal cavity was completely desufflated, the trocars removed and the skin closed with 4-0 Monocryl subcuticular suture and skin glue.  All sponge and needle counts were correct at the end of the case.  Louanna Raw, MD General, Bariatric, & Minimally Invasive Surgery [facility], [place]

## 2022-12-27 NOTE — Anesthesia Postprocedure Evaluation (Signed)
Anesthesia Post Note  Patient: Travis Jacobs  Procedure(s) Performed: LAPAROSCOPIC BILATERAL INGUINAL HERNIA REPAIR WITH MESH (Bilateral)     Patient location during evaluation: PACU Anesthesia Type: General Level of consciousness: awake and alert Pain management: pain level controlled Vital Signs Assessment: post-procedure vital signs reviewed and stable Respiratory status: spontaneous breathing, nonlabored ventilation and respiratory function stable Cardiovascular status: blood pressure returned to baseline and stable Postop Assessment: no apparent nausea or vomiting Anesthetic complications: no  No notable events documented.  Last Vitals:  Vitals:   12/27/22 1145 12/27/22 1200  BP: (!) 154/82   Pulse:    Resp:    Temp:  (!) 36.4 C  SpO2:      Last Pain:  Vitals:   12/27/22 1200  TempSrc:   PainSc: 0-No pain                 Alper Guilmette,W. EDMOND

## 2022-12-27 NOTE — Anesthesia Preprocedure Evaluation (Addendum)
Anesthesia Evaluation  Patient identified by MRN, date of birth, ID band Patient awake    Reviewed: Allergy & Precautions, H&P , NPO status , Patient's Chart, lab work & pertinent test results, reviewed documented beta blocker date and time   Airway Mallampati: II  TM Distance: >3 FB Neck ROM: Full    Dental no notable dental hx. (+) Teeth Intact, Dental Advisory Given   Pulmonary former smoker   Pulmonary exam normal breath sounds clear to auscultation       Cardiovascular + CAD and + Cardiac Stents   Rhythm:Regular Rate:Normal     Neuro/Psych    Depression    negative neurological ROS     GI/Hepatic Neg liver ROS, hiatal hernia,GERD  Medicated,,  Endo/Other  negative endocrine ROS    Renal/GU negative Renal ROS  negative genitourinary   Musculoskeletal   Abdominal   Peds  Hematology  (+) Blood dyscrasia, anemia   Anesthesia Other Findings   Reproductive/Obstetrics negative OB ROS                             Anesthesia Physical Anesthesia Plan  ASA: 3  Anesthesia Plan: General   Post-op Pain Management: Tylenol PO (pre-op)*   Induction: Intravenous  PONV Risk Score and Plan: 3 and Ondansetron, Dexamethasone and Treatment may vary due to age or medical condition  Airway Management Planned: Oral ETT  Additional Equipment:   Intra-op Plan:   Post-operative Plan: Extubation in OR  Informed Consent: I have reviewed the patients History and Physical, chart, labs and discussed the procedure including the risks, benefits and alternatives for the proposed anesthesia with the patient or authorized representative who has indicated his/her understanding and acceptance.     Dental advisory given  Plan Discussed with: CRNA  Anesthesia Plan Comments:        Anesthesia Quick Evaluation

## 2022-12-27 NOTE — Transfer of Care (Signed)
Immediate Anesthesia Transfer of Care Note  Patient: Travis Jacobs  Procedure(s) Performed: LAPAROSCOPIC BILATERAL INGUINAL HERNIA REPAIR WITH MESH (Bilateral)  Patient Location: PACU  Anesthesia Type:General  Level of Consciousness: awake, alert , and oriented  Airway & Oxygen Therapy: Patient Spontanous Breathing  Post-op Assessment: Report given to RN and Post -op Vital signs reviewed and stable  Post vital signs: Reviewed and stable  Last Vitals:  Vitals Value Taken Time  BP 149/91 12/27/22 1138  Temp    Pulse 75 12/27/22 1141  Resp 15 12/27/22 1141  SpO2 92 % 12/27/22 1141  Vitals shown include unvalidated device data.  Last Pain:  Vitals:   12/27/22 0754  TempSrc: Oral         Complications: No notable events documented.

## 2022-12-27 NOTE — Discharge Instructions (Addendum)
Information for Discharge Teaching: EXPAREL (bupivacaine liposome injectable suspension)   Your surgeon or anesthesiologist gave you EXPAREL(bupivacaine) to help control your pain after surgery.  EXPAREL is a local anesthetic that provides pain relief by numbing the tissue around the surgical site. EXPAREL is designed to release pain medication over time and can control pain for up to 72 hours. Depending on how you respond to EXPAREL, you may require less pain medication during your recovery.  Possible side effects: Temporary loss of sensation or ability to move in the area where bupivacaine was injected. Nausea, vomiting, constipation Rarely, numbness and tingling in your mouth or lips, lightheadedness, or anxiety may occur. Call your doctor right away if you think you may be experiencing any of these sensations, or if you have other questions regarding possible side effects.  Follow all other discharge instructions given to you by your surgeon or nurse. Eat a healthy diet and drink plenty of water or other fluids.  If you return to the hospital for any reason within 96 hours following the administration of EXPAREL, it is important for health care providers to know that you have received this anesthetic. A teal colored band has been placed on your arm with the date, time and amount of EXPAREL you have received in order to alert and inform your health care providers. Please leave this armband in place for the full 96 hours following administration, and then you may remove the band.  GROIN HERNIA REPAIR POST OPERATIVE INSTRUCTIONS  Thinking Clearly  The anesthesia may cause you to feel different for 1 or 2 days. Do not drive, drink alcohol, or make any big decisions for at least 2 days.  Nutrition When you wake up, you will be able to drink small amounts of liquid. If you do not feel sick, you can slowly advance your diet to regular foods. Continue to drink lots of fluids, usually about 8 to  10 glasses per day. Eat a high-fiber diet so you don't strain during bowel movements. High-Fiber Foods Foods high in fiber include beans, bran cereals and whole-grain breads, peas, dried fruit (figs, apricots, and dates), raspberries, blackberries, strawberries, sweet corn, broccoli, baked potatoes with skin, plums, pears, apples, greens, and nuts. Activity Slowly increase your activity. Be sure to get up and walk every hour or so to prevent blood clots. No heavy lifting or strenuous activity for 4 weeks following surgery to prevent hernias at your incision sites or recurrence of your hernia. It is normal to feel tired. You may need more sleep than usual.  Get your rest but make sure to get up and move around frequently to prevent blood clots and pneumonia.  Work and Return to [employer] can go back to work when you feel well enough. Discuss the timing with your surgeon. You can usually go back to school or work 1 week or less after an laparoscopic or an open repair. If your work requires heavy lifting or strenuous activity you need to be placed on light duty for 4 weeks following surgery. You can return to gym class, sports or other physical activities 4 weeks after surgery.  Wound Care You may experience significant bruising in the groin including into the scrotum in males.  Rest, elevating the groin and scrotum above the level of the heart, ice and compression with tight fitting underwear can help.  Always wash your hands before and after touching near your incision site. Do not soak in a bathtub until cleared at your follow  up appointment. You may take a shower 24 hours after surgery. A small amount of drainage from the incision is normal. If the drainage is thick and yellow or the site is red, you may have an infection, so call your surgeon. If you have a drain in one of your incisions, it will be taken out in office when the drainage stops. Steri-Strips will fall off in 7 to 10 days or  they will be removed during your first office visit. If you have dermabond glue covering over the incision, allow the glue to flake off on its own. Protect the new skin, especially from the sun. The sun can burn and cause darker scarring. Your scar will heal in about 4 to 6 weeks and will become softer and continue to fade over the next year.  The cosmetic appearance of the incisions will improve over the course of the first year after surgery. Sensation around your incision will return in a few weeks or months.  Bowel Movements After intestinal surgery, you may have loose watery stools for several days. If watery diarrhea lasts longer than 3 days, contact your surgeon. Pain medication (narcotics) can cause constipation. Increase the fiber in your diet with high-fiber foods if you are constipated. You can take an over the counter stool softener like Colace to avoid constipation.  Additional over the counter medications can also be used if Colace isn't sufficient (for example, Milk of Magnesia or Miralax).  Pain The amount of pain is different for each person. Some people need only 1 to 3 doses of pain control medication, while others need more. Take alternating doses of tylenol and ibuprofen around the clock for the first five days following surgery.  This will provide a baseline of pain control and help with inflammation.  Take the narcotic pain medication in addition if needed for severe pain.  Contact Your Surgeon at [phone], if you have: Pain that will not go away Pain that gets worse A fever of more than 101F (38.3C) Repeated vomiting Swelling, redness, bleeding, or bad-smelling drainage from your wound site Strong abdominal pain No bowel movement or unable to pass gas for 3 days Watery diarrhea lasting longer than 3 days  Pain Control The goal of pain control is to minimize pain, keep you moving and help you heal. Your surgical team will work with you on your pain plan. Most  often a combination of therapies and medications are used to control your pain. You may also be given medication (local anesthetic) at the surgical site. This may help control your pain for several days. Extreme pain puts extra stress on your body at a time when your body needs to focus on healing. Do not wait until your pain has reached a level "10" or is unbearable before telling your doctor or nurse. It is much easier to control pain before it becomes severe. Following a laparoscopic procedure, pain is sometimes felt in the shoulder. This is due to the gas inserted into your abdomen during the procedure. Moving and walking helps to decrease the gas and the right shoulder pain.  Use the guide below for ways to manage your post-operative pain. Learn more by going to facs.org/safepaincontrol.  How Intense Is My Pain Common Therapies to Feel Better       I hardly notice my pain, and it does not interfere with my activities.  I notice my pain and it distracts me, but I can still do activities (sitting up, walking, standing).  Non-Medication  Therapies  Ice (in a bag, applied over clothing at the surgical site), elevation, rest, meditation, massage, distraction (music, TV, play) walking and mild exercise Splinting the abdomen with pillows +  Non-Opioid Medications Acetaminophen (Tylenol) Non-steroidal anti-inflammatory drugs (NSAIDS) Aspirin, Ibuprofen (Motrin, Advil) Naproxen (Aleve) Take these as needed, when you feel pain. Both acetaminophen and NSAIDs help to decrease pain and swelling (inflammation).      My pain is hard to ignore and is more noticeable even when I rest.  My pain interferes with my usual activities.  Non-Medication Therapies  +  Non-Opioid medications  Take on a regular schedule (around-the-clock) instead of as needed. (For example, Tylenol every 6 hours at 9:00 am, 3:00 pm, 9:00 pm, 3:00 am and Motrin every 6 hours at 12:00 am, 6:00 am, 12:00 pm, 6:00  pm)         I am focused on my pain, and I am not doing my daily activities.  I am groaning in pain, and I cannot sleep. I am unable to do anything.  My pain is as bad as it could be, and nothing else matters.  Non-Medication Therapies  +  Around-the-Clock Non-Opioid Medications  +  Short-acting opioids  Opioids should be used with other medications to manage severe pain. Opioids block pain and give a feeling of euphoria (feel high). Addiction, a serious side effect of opioids, is rare with short-term (a few days) use.  Examples of short-acting opioids include: Tramadol (Ultram), Hydrocodone (Norco, Vicodin), Hydromorphone (Dilaudid), Oxycodone (Oxycontin)     The above directions have been adapted from the [employer] Surgical Patient Education Program.  Please refer to the ACS website if needed: PreferredVet.ca.ashx   Louanna Raw, MD [facility], [place], [place], [place], [place]  [address] ?  [address], [place], [place]   [id number] [phone] ? [phone] ? FAX [phone] Web site: [email or web address]

## 2022-12-27 NOTE — H&P (Signed)
Admitting Physician: Nickola Major Zayvier Caravello  Service: General Surgery  CC: Hernias  Subjective   HPI: Travis Jacobs is an 72 y.o. male who is here for bilateral inguinal hernia repair.  Past Medical History:  Diagnosis Date   Anticoagulant long-term use    xarelto--- managed by cardiology   CAD (coronary artery disease) 02/2016   cardiologist---  dr Angelena Form;    abnormal  ETT ;  cardiac cath 02-28-2016 s/p  PCI w/  DES x1 to to OM1 (other nonob cad involving LAD/ RCA)   Diverticulosis of colon    GERD (gastroesophageal reflux disease)    Hemorrhoids    Hiatal hernia    History of hemoptysis    followed by dr byrum;  intermittant   History of kidney stones    age 56   History of rectal bleeding 01/28/2022   admission post op hemorrhoidectomy w/ syncope ;   per pt had 3 units PRBC   Hyperlipidemia    IBS (irritable bowel syndrome)    per pt constipatoin   Multiple food allergies    "lots of food allergies; beef, tuna, flounder, potatoes, garlic, tomatoes, apples, bananas, sesame seeds, shellfish   PAF (paroxysmal atrial fibrillation) ([place]) 09/2017   event monitor showed SB w/ episodes AFib   Pre-diabetes    Pulmonary nodules 2020   pulmonology---- dr byrum;  RUL/ RLL  survillance   PVC's (premature ventricular contractions)    S/P dilatation of esophageal stricture    followed by dr Henrene Pastor 2016 and 2022   Wears glasses     Past Surgical History:  Procedure Laterality Date   BRONCHIAL WASHINGS  01/11/2020   Procedure: BRONCHIAL WASHINGS;  Surgeon: Collene Gobble, MD;  Location: [place];  Service: Cardiopulmonary;;   CARDIAC CATHETERIZATION N/A 02/28/2016   Procedure: Left Heart Cath and Coronary Angiography;  Surgeon: Wellington Hampshire, MD;  Location: [place] [facility];  Service: Cardiovascular;  Laterality: N/A;   CARDIAC CATHETERIZATION N/A 02/28/2016   Procedure: Coronary Stent Intervention;  Surgeon: Wellington Hampshire, MD;  Location: [facility] [facility];   Service: Cardiovascular;  Laterality: N/A;   COLONOSCOPY WITH ESOPHAGOGASTRODUODENOSCOPY (EGD) AND ESOPHAGEAL DILATION (ED)  12/20/2020   dr Henrene Pastor   CYSTOSCOPY WITH STENT PLACEMENT     age 36   (ureteral stone)   INGUINAL HERNIA REPAIR Left 1960   TONSILLECTOMY     child   VIDEO BRONCHOSCOPY N/A 01/11/2020   Procedure: VIDEO BRONCHOSCOPY WITHOUT FLUORO;  Surgeon: Collene Gobble, MD;  Location: [place];  Service: Cardiopulmonary;  Laterality: N/A;    Family History  Problem Relation Age of Onset   Heart disease Mother    Heart disease Father    Colon cancer Neg Hx    Esophageal cancer Neg Hx    Rectal cancer Neg Hx    Stomach cancer Neg Hx     Social:  reports that he quit smoking about 34 years ago. His smoking use included cigarettes. He has a 20.00 pack-year smoking history. He has never used smokeless tobacco. He reports current alcohol use. He reports that he does not use drugs.  Allergies:  Allergies  Allergen Reactions   Iodinated Contrast Media Swelling   Shellfish Allergy Anaphylaxis    Not sure of reaction found out about allergy through [place]   Sulfa Antibiotics Swelling    Mouth swelling   Aleve [Naproxen Sodium] Nausea And Vomiting   Apple Juice Swelling   Banana Nausea And Vomiting  Beef-Derived Products Nausea And Vomiting   Erythromycin Swelling    Mouth swells   Fish Allergy Swelling    Tuna and flounder   Garlic Swelling   Sesame Seed (Diagnostic) Swelling   Tomato Hives and Nausea And Vomiting    welts   Iodine Swelling    "I blow up like a balloon"   Strawberry (Diagnostic) Rash   Sweet Potato Other (See Comments)    Sweet and Regular Potatoes - diarrhea    Medications: Current Outpatient Medications  Medication Instructions   acetaminophen (TYLENOL) 500 mg, Oral, Every 8 hours PRN   aspirin EC 81 mg, Oral, Daily   atorvastatin (LIPITOR) 80 mg, Oral, Daily at bedtime   Cialis 5 mg, Oral, Daily PRN   docusate sodium (COLACE) 300 mg,  Oral, 2 times daily, 3 CAPS IN THE AM AND 2 CAPS IN THE PM   hydrocortisone (ANUSOL-HC) 2.5 % rectal cream 1 Application, Rectal, Nightly   Loratadine 10 MG CAPS 1 capsule, Oral, Daily   metoprolol tartrate (LOPRESSOR) 25 mg, Oral, 2 times daily   Multiple Vitamin (MULTIVITAMIN WITH MINERALS) TABS tablet 1 tablet, Oral, Daily at bedtime   nitroGLYCERIN (NITROSTAT) 0.4 mg, Sublingual, Every 5 min x3 PRN   pantoprazole (PROTONIX) 40 mg, Oral, Daily   rivaroxaban (XARELTO) 20 MG TABS tablet TAKE 1 TABLET BY MOUTH EVERY DAY WITH SUPPER    ROS - all of the below systems have been reviewed with the patient and positives are indicated with bold text General: chills, fever or night sweats Eyes: blurry vision or double vision ENT: epistaxis or sore throat Allergy/Immunology: itchy/watery eyes or nasal congestion Hematologic/Lymphatic: bleeding problems, blood clots or swollen lymph nodes Endocrine: temperature intolerance or unexpected weight changes Breast: new or changing breast lumps or nipple discharge Resp: cough, shortness of breath, or wheezing CV: chest pain or dyspnea on exertion GI: as per HPI GU: dysuria, trouble voiding, or hematuria MSK: joint pain or joint stiffness Neuro: TIA or stroke symptoms Derm: pruritus and skin lesion changes Psych: anxiety and depression  Objective   PE Blood pressure (!) 145/79, pulse 60, temperature 97.8 F (36.6 C), temperature source Oral, resp. rate 17, height 5\' 10"  (1.778 m), weight 83.1 kg, SpO2 99 %. Constitutional: NAD; conversant; no deformities Eyes: Moist conjunctiva; no lid lag; anicteric; PERRL Neck: Trachea midline; no thyromegaly Lungs: Normal respiratory effort; no tactile fremitus CV: RRR; no palpable thrills; no pitting edema GI: Abd bilateral inguinal hernias; no palpable hepatosplenomegaly MSK: Normal range of motion of extremities; no clubbing/cyanosis Psychiatric: Appropriate affect; alert and oriented x3 Lymphatic: No  palpable cervical or axillary lymphadenopathy  Results for orders placed or performed during the hospital encounter of 12/27/22 (from the past 24 hour(s))  I-STAT, chem 8     Status: Abnormal   Collection Time: 12/27/22  8:36 AM  Result Value Ref Range   Sodium 137 135 - 145 mmol/L   Potassium 4.1 3.5 - 5.1 mmol/L   Chloride 106 98 - 111 mmol/L   BUN 16 8 - 23 mg/dL   Creatinine, Ser 0.80 0.61 - 1.24 mg/dL   Glucose, Bld 120 (H) 70 - 99 mg/dL   Calcium, Ion 1.13 (L) 1.15 - 1.40 mmol/L   TCO2 22 22 - 32 mmol/L   Hemoglobin 15.6 13.0 - 17.0 g/dL   HCT 46.0 39.0 - 52.0 %    Imaging Orders  No imaging studies ordered today  CT Abd/Pel 10/09/22  No evidence of urolithiasis, hydronephrosis, or other acute  findings.  Mild sigmoid diverticulosis, without radiographic evidence of diverticulitis.  Mildly enlarged prostate.  Small-to-moderate bilateral inguinal hernias. The right inguinal hernia contains small bowel, however, there is no evidence of bowel obstruction or strangulation.1    Assessment and Plan   Recurrent left inguinal hernia  Right inguinal hernia  I recommended laparoscopic bilateral inguinal hernia repair with mesh. The procedure, its risks, benefits and altenratives were discussed and the patient granted consent to proceed. He has completed a preoperative cardiac evaluation and will held his Xarelto prior to surgery.      ICD-10-CM   1. Pre-op testing  Z01.818 CBG per Guidelines for Diabetes Management for Patients Undergoing Surgery ([facility], [facility], and [facility] only)    CBG per protocol    I-Stat, Chem 8 on day of surgery per protocol    CBG per Guidelines for Diabetes Management for Patients Undergoing Surgery ([facility], [facility], and [facility] only)    CBG per protocol    I-Stat, Chem 8 on day of surgery per protocol       Felicie Morn, MD  [facility] Use [email or web address] to contact on call provider

## 2022-12-30 ENCOUNTER — Encounter (HOSPITAL_BASED_OUTPATIENT_CLINIC_OR_DEPARTMENT_OTHER): Payer: Self-pay | Admitting: Surgery

## 2023-02-17 ENCOUNTER — Other Ambulatory Visit: Payer: Self-pay | Admitting: Cardiovascular Disease

## 2023-02-17 DIAGNOSIS — I480 Paroxysmal atrial fibrillation: Secondary | ICD-10-CM

## 2023-02-17 NOTE — Telephone Encounter (Signed)
Prescription refill request for Xarelto received.  Indication: Afib  Last office visit: 11/01/22 Molli Hazard)  Weight: 83.1kg Age: 72 Scr: 0.77 (10/09/22)  CrCl: 100.67ml/min  Appropriate dose. Refill sent.

## 2023-05-16 ENCOUNTER — Other Ambulatory Visit: Payer: Self-pay | Admitting: Cardiovascular Disease

## 2023-06-18 ENCOUNTER — Ambulatory Visit: Payer: Medicare Other | Admitting: Internal Medicine

## 2023-07-14 ENCOUNTER — Ambulatory Visit: Payer: Medicare Other | Attending: Cardiovascular Disease | Admitting: Cardiovascular Disease

## 2023-07-14 ENCOUNTER — Encounter: Payer: Self-pay | Admitting: Cardiovascular Disease

## 2023-07-14 VITALS — BP 140/78 | HR 65 | Ht 70.0 in | Wt 184.2 lb

## 2023-07-14 DIAGNOSIS — E7800 Pure hypercholesterolemia, unspecified: Secondary | ICD-10-CM | POA: Insufficient documentation

## 2023-07-14 DIAGNOSIS — I480 Paroxysmal atrial fibrillation: Secondary | ICD-10-CM | POA: Diagnosis present

## 2023-07-14 DIAGNOSIS — R0602 Shortness of breath: Secondary | ICD-10-CM | POA: Diagnosis not present

## 2023-07-14 DIAGNOSIS — I2510 Atherosclerotic heart disease of native coronary artery without angina pectoris: Secondary | ICD-10-CM | POA: Diagnosis not present

## 2023-07-14 MED ORDER — RIVAROXABAN 20 MG PO TABS: 20.0000 mg | ORAL_TABLET | Freq: Every day | ORAL | 3 refills | Status: DC

## 2023-07-14 MED ORDER — NITROGLYCERIN 0.4 MG SL SUBL: 0.4000 mg | SUBLINGUAL_TABLET | SUBLINGUAL | 3 refills | Status: DC | PRN

## 2023-07-14 MED ORDER — ATORVASTATIN CALCIUM 80 MG PO TABS: 80.0000 mg | ORAL_TABLET | Freq: Every day | ORAL | 3 refills | Status: DC

## 2023-07-14 NOTE — Progress Notes (Signed)
Chief Complaint  Patient presents with   Follow-up    CAD, atrial fibrillation   History of Present Illness: 72 yo male with history of CAD, paroxysmal atrial fibrillation, PVCs, HLD and GERD who is here today for cardiac follow up. He had a cardiac cath in May 2017 in the setting of unstable angina which showed a severe stenosis in the first obtuse marginal branch. A drug eluting stent was placed in the first OM. There was mild disease in the RCA and LAD. LV function was normal.  Frequent PVCs noted during his hospital stay. Cardiac monitor June 2017 with frequent PVCS and periods of sinus bradycardia. He was seen in the EP clinic by Dr. Graciela Husbands and he recommended the patient continue his beta blocker. He called our office complaining of palpitations at home mid December 2018 with HR up to the 150s. Cardiac monitor December 2018 showed sinus bradycardia and episodes of atrial fibrillation/flutter. He is now on a beta blocker and Xarelto.   He is here today for follow up. The patient denies any chest pain, palpitations, lower extremity edema, orthopnea, PND, dizziness, near syncope or syncope. He has dyspnea at rest and when laying flat at night. He has been exercising and having no issues when on the treadmill.    Primary Care Physician: Charlane Ferretti, DO  Past Medical History:  Diagnosis Date   Anticoagulant long-term use    xarelto--- managed by cardiology   CAD (coronary artery disease) 02/2016   cardiologist---  dr Clifton James;    abnormal  ETT ;  cardiac cath 02-28-2016 s/p  PCI w/  DES x1 to to OM1 (other nonob cad involving LAD/ RCA)   Diverticulosis of colon    GERD (gastroesophageal reflux disease)    Hemorrhoids    Hiatal hernia    History of hemoptysis    followed by dr byrum;  intermittant   History of kidney stones    age 26   History of rectal bleeding 01/28/2022   admission post op hemorrhoidectomy w/ syncope ;   per pt had 3 units PRBC   Hyperlipidemia    IBS (irritable  bowel syndrome)    per pt constipatoin   Multiple food allergies    "lots of food allergies; beef, tuna, flounder, potatoes, garlic, tomatoes, apples, bananas, sesame seeds, shellfish   PAF (paroxysmal atrial fibrillation) ([facility]) 09/2017   event monitor showed SB w/ episodes AFib   Pre-diabetes    Pulmonary nodules 2020   pulmonology---- dr byrum;  RUL/ RLL  survillance   PVC's (premature ventricular contractions)    S/P dilatation of esophageal stricture    followed by dr Marina Goodell 2016 and 2022   Wears glasses     Past Surgical History:  Procedure Laterality Date   BRONCHIAL WASHINGS  01/11/2020   Procedure: BRONCHIAL WASHINGS;  Surgeon: Leslye Peer, MD;  Location: [facility];  Service: Cardiopulmonary;;   CARDIAC CATHETERIZATION N/A 02/28/2016   Procedure: Left Heart Cath and Coronary Angiography;  Surgeon: Iran Ouch, MD;  [facility];  Service: Cardiovascular;  Laterality: N/A;   CARDIAC CATHETERIZATION N/A 02/28/2016   Procedure: Coronary Stent Intervention;  Surgeon: Iran Ouch, MD;  [facility];  Service: Cardiovascular;  Laterality: N/A;   COLONOSCOPY WITH ESOPHAGOGASTRODUODENOSCOPY (EGD) AND ESOPHAGEAL DILATION (ED)  12/20/2020   dr Marina Goodell   CYSTOSCOPY WITH STENT PLACEMENT     age 71   (ureteral stone)   INGUINAL HERNIA REPAIR Left 1960  INGUINAL HERNIA REPAIR Bilateral 12/27/2022   Procedure: LAPAROSCOPIC BILATERAL INGUINAL HERNIA REPAIR WITH MESH;  Surgeon: Stechschulte, Hyman Hopes, MD;  Location: [facility];  Service: General;  Laterality: Bilateral;   TONSILLECTOMY     child   VIDEO BRONCHOSCOPY N/A 01/11/2020   Procedure: VIDEO BRONCHOSCOPY WITHOUT FLUORO;  Surgeon: Leslye Peer, MD;  Location: [facility];  Service: Cardiopulmonary;  Laterality: N/A;    Current Outpatient Medications  Medication Sig Dispense Refill   acetaminophen (TYLENOL) 500 MG tablet Take 500 mg by mouth every 8 (eight) hours as  needed for moderate pain or headache.     aspirin EC 81 MG tablet Take 1 tablet (81 mg total) by mouth daily. 90 tablet 3   CIALIS 5 MG tablet Take 5 mg by mouth daily as needed for erectile dysfunction.  98   docusate sodium (COLACE) 100 MG capsule Take 300 mg by mouth 2 (two) times daily. 3 CAPS IN THE AM AND 2 CAPS IN THE PM     fluticasone (FLONASE) 50 MCG/ACT nasal spray Place 1 spray into both nostrils as needed for allergies.     hydrocortisone (ANUSOL-HC) 2.5 % rectal cream Place 1 Application rectally at bedtime. (Patient taking differently: Place 1 Application rectally at bedtime as needed for hemorrhoids or anal itching.) 30 g 1   Loratadine 10 MG CAPS Take 1 capsule by mouth daily.     metoprolol tartrate (LOPRESSOR) 25 MG tablet TAKE 1 TABLET BY MOUTH TWICE A DAY 180 tablet 1   Multiple Vitamin (MULTIVITAMIN WITH MINERALS) TABS tablet Take 1 tablet by mouth at bedtime.     pantoprazole (PROTONIX) 40 MG tablet Take 1 tablet (40 mg total) by mouth daily. (Patient taking differently: Take 40 mg by mouth daily.)     atorvastatin (LIPITOR) 80 MG tablet Take 1 tablet (80 mg total) by mouth at bedtime. 90 tablet 3   nitroGLYCERIN (NITROSTAT) 0.4 MG SL tablet Place 1 tablet (0.4 mg total) under the tongue every 5 (five) minutes x 3 doses as needed for chest pain. 25 tablet 3   rivaroxaban (XARELTO) 20 MG TABS tablet Take 1 tablet (20 mg total) by mouth daily with supper. 90 tablet 3   No current facility-administered medications for this visit.    Allergies  Allergen Reactions   Iodinated Contrast Media Swelling   Shellfish Allergy Anaphylaxis    Not sure of reaction found out about allergy through [place]   Sulfa Antibiotics Swelling    Mouth swelling   Aleve [Naproxen Sodium] Nausea And Vomiting   Apple Juice Swelling   Banana Nausea And Vomiting   Beef-Derived Products Nausea And Vomiting   Erythromycin Swelling    Mouth swells   Fish Allergy Swelling    Tuna and flounder    Garlic Swelling   Sesame Seed (Diagnostic) Swelling   Tomato Hives and Nausea And Vomiting    welts   Iodine Swelling    "I blow up like a balloon"   Strawberry (Diagnostic) Rash   Sweet Potato Other (See Comments)    Sweet and Regular Potatoes - diarrhea    Social History   Socioeconomic History   Marital status: Married    Spouse name: Not on file   Number of children: Not on file   Years of education: Not on file   Highest education level: Not on file  Occupational History   Not on file  Tobacco Use   Smoking status: Former    Current packs/day:  0.00    Average packs/day: 1 pack/day for 20.0 years (20.0 ttl pk-yrs)    Types: Cigarettes    Start date: 78    Quit date: 31    Years since quitting: 34.7   Smokeless tobacco: Never  Vaping Use   Vaping status: Never Used  Substance and Sexual Activity   Alcohol use: Yes    Comment: occasional   Drug use: Never   Sexual activity: Yes    Birth control/protection: Other-see comments    Comment: vasectomy  Other Topics Concern   Not on file  Social History Narrative   Not on file   Social Determinants of Health   Financial Resource Strain: Not on file  Food Insecurity: Not on file  Transportation Needs: Not on file  Physical Activity: Not on file  Stress: Not on file  Social Connections: Not on file  Intimate Partner Violence: Not on file    Family History  Problem Relation Age of Onset   Heart disease Mother    Heart disease Father    Colon cancer Neg Hx    Esophageal cancer Neg Hx    Rectal cancer Neg Hx    Stomach cancer Neg Hx     Review of Systems:  As stated in the HPI and otherwise negative.   BP (!) 140/78   Pulse 65   Ht 5\' 10"  (1.778 m)   Wt 83.6 kg   SpO2 95%   BMI 26.43 kg/m   Physical Examination: General: Well developed, well nourished, NAD  HEENT: OP clear, mucus membranes moist  SKIN: warm, dry. No rashes. Neuro: No focal deficits  Musculoskeletal: Muscle strength 5/5 all  ext  Psychiatric: Mood and affect normal  Neck: No JVD, no carotid bruits, no thyromegaly, no lymphadenopathy.  Lungs:Clear bilaterally, no wheezes, rhonci, crackles Cardiovascular: Regular rate and rhythm. No murmurs, gallops or rubs. Abdomen:Soft. Bowel sounds present. Non-tender.  Extremities: No lower extremity edema. Pulses are 2 + in the bilateral DP/PT.  Cardiac cath 02/28/16: Prox RCA lesion, 30% stenosed. Mid RCA to Dist RCA lesion, 40% stenosed. Prox LAD to Mid LAD lesion, 30% stenosed. 1st Mrg lesion, 99% stenosed. Post intervention, there is a 0% residual stenosis. The left ventricular systolic function is normal.   1. Severe (99% ) one-vessel coronary artery disease involving proximal OM1 which is very large and supplies most of the left circumflex distribution. 2. Normal LV systolic function and mildly elevated left ventricular end-diastolic pressure. 3. Successful angioplasty and drug-eluting stent placement to OM 1.  Echo 01/15/17: - Left ventricle: The cavity size was normal. Wall thickness was   normal. Systolic function was normal. The estimated ejection   fraction was in the range of 60% to 65%. Wall motion was normal;   there were no regional wall motion abnormalities. Doppler   parameters are consistent with abnormal left ventricular   relaxation (grade 1 diastolic dysfunction). The E/e&' ratio is   between 8-15, suggesting indeterminate LV filling pressure. - Mitral valve: Mildly thickened leaflets . There was trivial   regurgitation. - Left atrium: The atrium was normal in size. - Atrial septum: No defect or patent foramen ovale was identified. - Tricuspid valve: There was mild regurgitation. - Pulmonary arteries: PA peak pressure: 38 mm Hg (S). - Inferior vena cava: The vessel was normal in size. The   respirophasic diameter changes were in the normal range (>= 50%),   consistent with normal central venous pressure.   Impressions:   - LVEF 60-65%,  normal  wall thickness, normal wall motion, grade 1   DD with indeterminate LV filling pressure, normal LA size, upper   normal RA size, trivial MR, mild TR, RVSP 38 mmHg, normal IVC.  EKG:  EKG is ordered today. The ekg ordered today demonstrates  EKG Interpretation Date/Time:  Monday July 14 2023 15:25:30 EDT Ventricular Rate:  65 PR Interval:  170 QRS Duration:  78 QT Interval:  386 QTC Calculation: 401 R Axis:   64  Text Interpretation: Normal sinus rhythm Normal ECG When compared with ECG of 28-Jan-2022 22:20, PREVIOUS ECG IS PRESENT Confirmed by Verne Carrow [phone]) on 07/14/2023 3:51:02 PM    Recent Labs: 10/09/2022: ALT 30; Platelets 288.0 12/27/2022: BUN 16; Creatinine, Ser 0.80; Hemoglobin 15.6; Potassium 4.1; Sodium 137   Lipid Panel Followed in primary care   Wt Readings from Last 3 Encounters:  07/14/23 83.6 kg  12/27/22 83.1 kg  10/16/22 83 kg    Assessment and Plan:   1. CAD without angina: He had a drug eluting stent placed in the obtuse marginal branch of the Circumflex in May 2017. Normal LV function by echo April 2018. No chest pain. Will continue ASA, statin and beta blocker.        2. HLD: Lipids followed in primary care. Continue statin.   3. Atrial fibrillation, paroxysmal: He is in sinus today. Will continue beta blocker and Xarelto.    4. Dyspnea: Will repeat echo now. No volume overload on exam.   Labs/ tests ordered today include:   Orders Placed This Encounter  Procedures   EKG 12-Lead   ECHOCARDIOGRAM COMPLETE   Disposition:   F/U with me in 12 months  Signed, Verne Carrow, MD 07/14/2023 4:11 PM    [facility] [address] [place], [place], [place]  [address] Phone: [phone]; Fax: [phone]

## 2023-07-14 NOTE — Patient Instructions (Signed)
Medication Instructions:  No changes *If you need a refill on your cardiac medications before your next appointment, please call your pharmacy*   Lab Work: none If you have labs (blood work) drawn today and your tests are completely normal, you will receive your results only by: MyChart Message (if you have MyChart) OR A paper copy in the mail If you have any lab test that is abnormal or we need to change your treatment, we will call you to review the results.   Testing/Procedures: Your physician has requested that you have an echocardiogram. Echocardiography is a painless test that uses sound waves to create images of your heart. It provides your doctor with information about the size and shape of your heart and how well your heart's chambers and valves are working. This procedure takes approximately one hour. There are no restrictions for this procedure. Please do NOT wear cologne, perfume, aftershave, or lotions (deodorant is allowed). Please arrive 15 minutes prior to your appointment time.    Follow-Up: At [facility], you and your health needs are our priority.  As part of our continuing mission to provide you with exceptional heart care, we have created designated Provider Care Teams.  These Care Teams include your primary Cardiologist (physician) and Advanced Practice Providers (APPs -  Physician Assistants and Nurse Practitioners) who all work together to provide you with the care you need, when you need it.   Your next appointment:   12 month(s)  Provider:   Christopher McAlhany, MD      

## 2023-08-08 ENCOUNTER — Ambulatory Visit: Payer: Medicare Other | Admitting: Cardiovascular Disease

## 2023-08-12 ENCOUNTER — Ambulatory Visit: Payer: Medicare Other | Admitting: Internal Medicine

## 2023-08-26 ENCOUNTER — Ambulatory Visit (HOSPITAL_COMMUNITY): Payer: Medicare Other | Attending: Cardiology

## 2023-08-26 DIAGNOSIS — R0602 Shortness of breath: Secondary | ICD-10-CM | POA: Insufficient documentation

## 2023-08-26 LAB — ECHOCARDIOGRAM COMPLETE
Area-P 1/2: 3.28 cm2
S' Lateral: 2.8 cm

## 2023-08-27 ENCOUNTER — Other Ambulatory Visit: Payer: Self-pay | Admitting: Cardiovascular Disease

## 2024-05-31 ENCOUNTER — Other Ambulatory Visit: Payer: Self-pay | Admitting: Medical Genetics

## 2024-06-10 ENCOUNTER — Other Ambulatory Visit: Payer: Self-pay

## 2024-06-10 DIAGNOSIS — Z006 Encounter for examination for normal comparison and control in clinical research program: Secondary | ICD-10-CM

## 2024-06-18 LAB — GENECONNECT MOLECULAR SCREEN: Genetic Analysis Overall Interpretation: NEGATIVE

## 2024-07-14 ENCOUNTER — Other Ambulatory Visit: Payer: Self-pay | Admitting: Cardiovascular Disease

## 2024-07-14 DIAGNOSIS — I480 Paroxysmal atrial fibrillation: Secondary | ICD-10-CM

## 2024-07-14 DIAGNOSIS — E7800 Pure hypercholesterolemia, unspecified: Secondary | ICD-10-CM

## 2024-07-16 ENCOUNTER — Other Ambulatory Visit: Payer: Self-pay | Admitting: Cardiovascular Disease

## 2024-07-16 DIAGNOSIS — D509 Iron deficiency anemia, unspecified: Secondary | ICD-10-CM | POA: Insufficient documentation

## 2024-07-16 DIAGNOSIS — E118 Type 2 diabetes mellitus with unspecified complications: Secondary | ICD-10-CM | POA: Insufficient documentation

## 2024-07-16 NOTE — Telephone Encounter (Signed)
 Prescription refill request for Xarelto  received.  Indication: PAF Last office visit: Weight: 184# Age: 73 Scr: 0.74 care everywhere 06/30/24 CrCl: 103 ml/min

## 2024-09-01 ENCOUNTER — Encounter: Payer: Self-pay | Admitting: Cardiovascular Disease

## 2024-09-02 ENCOUNTER — Encounter: Payer: Self-pay | Admitting: Cardiovascular Disease

## 2024-11-11 NOTE — Progress Notes (Signed)
"   Chief Complaint  Patient presents with   Follow-up    CAD, PAF   History of Present Illness: 74 yo male with history of CAD, paroxysmal atrial fibrillation, PVCs, HLD and GERD who is here today for cardiac follow up. He had a cardiac cath in May 2017 in the setting of unstable angina which showed a severe stenosis in the first obtuse marginal branch. A drug eluting stent was placed in the first OM. There was mild disease in the RCA and LAD. LV function was normal.  Frequent PVCs noted during his hospital stay. Cardiac monitor June 2017 with frequent PVCS and periods of sinus bradycardia. He was seen in the EP clinic by Dr. Fernande and he recommended the patient continue his beta blocker. He called our office complaining of palpitations at home mid December 2018 with HR up to the 150s. Cardiac monitor December 2018 showed sinus bradycardia and episodes of atrial fibrillation/flutter. Echo November 2024 with LVEF=60-65%. Normal RV function. Trivial MR.   He is here today for follow up. The patient denies any chest pain, dyspnea, palpitations, lower extremity edema, orthopnea, PND, dizziness, near syncope or syncope.    Primary Care Physician: Valentin Skates, DO  Past Medical History:  Diagnosis Date   Anticoagulant long-term use    xarelto --- managed by cardiology   CAD (coronary artery disease) 02/2016   cardiologist---  dr verlin;    abnormal  ETT ;  cardiac cath 02-28-2016 s/p  PCI w/  DES x1 to to OM1 (other nonob cad involving LAD/ RCA)   Diverticulosis of colon    GERD (gastroesophageal reflux disease)    Hemorrhoids    Hiatal hernia    History of hemoptysis    followed by dr byrum;  intermittant   History of kidney stones    age 40   History of rectal bleeding 01/28/2022   admission post op hemorrhoidectomy w/ syncope ;   per pt had 3 units PRBC   Hyperlipidemia    IBS (irritable bowel syndrome)    per pt constipatoin   Multiple food allergies    lots of food allergies;  beef, tuna, flounder, potatoes, garlic, tomatoes, apples, bananas, sesame seeds, shellfish   PAF (paroxysmal atrial fibrillation) (HCC) 09/2017   event monitor showed SB w/ episodes AFib   Pre-diabetes    Pulmonary nodules 2020   pulmonology---- dr byrum;  RUL/ RLL  survillance   PVC's (premature ventricular contractions)    S/P dilatation of esophageal stricture    followed by dr abran 2016 and 2022   Wears glasses     Past Surgical History:  Procedure Laterality Date   BRONCHIAL WASHINGS  01/11/2020   Procedure: BRONCHIAL WASHINGS;  Surgeon: Shelah Lamar RAMAN, MD;  Location: [facility];  Service: Cardiopulmonary;;   CARDIAC CATHETERIZATION N/A 02/28/2016   Procedure: Left Heart Cath and Coronary Angiography;  Surgeon: Deatrice DELENA Cage, MD;  Location: [facility] INVASIVE CV LAB;  Service: Cardiovascular;  Laterality: N/A;   CARDIAC CATHETERIZATION N/A 02/28/2016   Procedure: Coronary Stent Intervention;  Surgeon: Deatrice DELENA Cage, MD;  Location: [facility] CV LAB;  Service: Cardiovascular;  Laterality: N/A;   COLONOSCOPY WITH ESOPHAGOGASTRODUODENOSCOPY (EGD) AND ESOPHAGEAL DILATION (ED)  12/20/2020   dr abran   CYSTOSCOPY WITH STENT PLACEMENT     age 70   (ureteral stone)   INGUINAL HERNIA REPAIR Left 1960   INGUINAL HERNIA REPAIR Bilateral 12/27/2022   Procedure: LAPAROSCOPIC BILATERAL INGUINAL HERNIA REPAIR WITH MESH;  Surgeon: Lyndel Mt  J, MD;  [facility];  Service: General;  Laterality: Bilateral;   TONSILLECTOMY     child   VIDEO BRONCHOSCOPY N/A 01/11/2020   Procedure: VIDEO BRONCHOSCOPY WITHOUT FLUORO;  Surgeon: Shelah Lamar RAMAN, MD;  Location: [facility] ENDOSCOPY;  Service: Cardiopulmonary;  Laterality: N/A;    Current Outpatient Medications  Medication Sig Dispense Refill   acetaminophen  (TYLENOL ) 500 MG tablet Take 500 mg by mouth every 8 (eight) hours as needed for moderate pain or headache.     aspirin  EC 81 MG tablet Take 1 tablet (81 mg total) by  mouth daily. 90 tablet 3   atorvastatin  (LIPITOR) 80 MG tablet TAKE 1 TABLET BY MOUTH EVERYDAY AT BEDTIME 90 tablet 3   CIALIS 5 MG tablet Take 5 mg by mouth daily as needed for erectile dysfunction.  98   docusate sodium (COLACE) 100 MG capsule Take 300 mg by mouth 2 (two) times daily. 3 CAPS IN THE AM AND 2 CAPS IN THE PM     fluticasone (FLONASE) 50 MCG/ACT nasal spray Place 1 spray into both nostrils as needed for allergies.     hydrocortisone  (ANUSOL -HC) 2.5 % rectal cream Place 1 Application rectally at bedtime. (Patient taking differently: Place 1 Application rectally at bedtime as needed for hemorrhoids or anal itching.) 30 g 1   Loratadine 10 MG CAPS Take 1 capsule by mouth daily.     metoprolol  tartrate (LOPRESSOR ) 25 MG tablet TAKE 1 TABLET BY MOUTH TWICE A DAY 180 tablet 2   Multiple Vitamin (MULTIVITAMIN WITH MINERALS) TABS tablet Take 1 tablet by mouth at bedtime.     nitroGLYCERIN  (NITROSTAT ) 0.4 MG SL tablet PLEASE SEE ATTACHED FOR DETAILED DIRECTIONS 25 tablet 3   pantoprazole  (PROTONIX ) 40 MG tablet Take 1 tablet (40 mg total) by mouth daily. (Patient taking differently: Take 40 mg by mouth daily.)     rivaroxaban  (XARELTO ) 20 MG TABS tablet TAKE 1 TABLET BY MOUTH DAILY WITH SUPPER. 90 tablet 1   No current facility-administered medications for this visit.    Allergies  Allergen Reactions   Iodinated Contrast Media Swelling   Shellfish Allergy Anaphylaxis    Not sure of reaction found out about allergy through [facility]   Sulfa Antibiotics Swelling    Mouth swelling   Aleve [Naproxen Sodium] Nausea And Vomiting   Apple Juice Swelling   Banana Nausea And Vomiting   Bovine (Beef) Protein-Containing Drug Products Nausea And Vomiting   Erythromycin Swelling    Mouth swells   Fish Allergy Swelling    Tuna and flounder   Garlic Swelling   Sesame Seed (Diagnostic) Swelling   Tomato Hives and Nausea And Vomiting    welts   Iodine Swelling    I blow up like a balloon    Strawberry (Diagnostic) Rash   Sweet Potato Other (See Comments)    Sweet and Regular Potatoes - diarrhea    Social History   Socioeconomic History   Marital status: Married    Spouse name: Not on file   Number of children: Not on file   Years of education: Not on file   Highest education level: Not on file  Occupational History   Not on file  Tobacco Use   Smoking status: Former    Current packs/day: 0.00    Average packs/day: 1 pack/day for 20.0 years (20.0 ttl pk-yrs)    Types: Cigarettes    Start date: 20    Quit date: 18    Years since  quitting: 36.1   Smokeless tobacco: Never  Vaping Use   Vaping status: Never Used  Substance and Sexual Activity   Alcohol use: Yes    Comment: occasional   Drug use: Never   Sexual activity: Yes    Birth control/protection: Other-see comments    Comment: vasectomy  Other Topics Concern   Not on file  Social History Narrative   Not on file   Social Drivers of Health   Tobacco Use: Medium Risk (11/12/2024)   Patient History    Smoking Tobacco Use: Former    Smokeless Tobacco Use: Never    Passive Exposure: Not on [place] Strain: Not on file  Food Insecurity: Not on file  Transportation Needs: Not on file  Physical Activity: Not on file  Stress: Not on file  Social Connections: Not on file  Intimate Partner Violence: Not on file  Depression (PHQ2-9): Not on file  Alcohol Screen: Not on file  Housing: Not on file  Utilities: Not on file  Health Literacy: Not on file    Family History  Problem Relation Age of Onset   Heart disease Mother    Heart disease Father    Colon cancer Neg Hx    Esophageal cancer Neg Hx    Rectal cancer Neg Hx    Stomach cancer Neg Hx     Review of Systems:  As stated in the HPI and otherwise negative.   BP 120/70   Pulse 69   Ht 5' 9 (1.753 m)   Wt 171 lb (77.6 kg)   SpO2 97%   BMI 25.25 kg/m   Physical Examination: General: Well developed, well nourished,  NAD  SKIN: warm, dry. Neuro: No focal deficits  Psychiatric: Mood and affect normal  Neck: No JVD Lungs:Clear bilaterally, no wheezes, rhonci, crackles Cardiovascular: Regular rate and rhythm. No murmurs, gallops or rubs. Abdomen:Soft.  Extremities: No lower extremity edema.    EKG:  EKG is ordered today. The ekg ordered today demonstrates  EKG Interpretation Date/Time:  Friday November 12 2024 09:40:54 EST Ventricular Rate:  67 PR Interval:  176 QRS Duration:  80 QT Interval:  364 QTC Calculation: 384 R Axis:   41  Text Interpretation: Normal sinus rhythm Normal ECG When compared with ECG of 14-Jul-2023 15:25, No significant change was found Confirmed by Verlin Bruckner [phone]) on 11/12/2024 9:42:41 AM    Recent Labs: No results found for requested labs within last 365 days.   Lipid Panel Followed in primary care   Wt Readings from Last 3 Encounters:  11/12/24 171 lb (77.6 kg)  07/14/23 184 lb 3.2 oz (83.6 kg)  12/27/22 183 lb 3.2 oz (83.1 kg)    Assessment and Plan:   1. CAD without angina: He had a drug eluting stent placed in the obtuse marginal branch of the Circumflex in May 2017. Normal LV function by echo November 2024. No chest pain.  -Continue ASA, Lipitor and Lopressor          2. HLD: Lipids followed in primary care. LDL 50 in April 2025.  -Continue Lipitor  3. Atrial fibrillation, paroxysmal: Sinus today.  -Continue Lopressor  and Xarelto .    Labs/ tests ordered today include:  Orders Placed This Encounter  Procedures   EKG 12-Lead   Disposition:   F/U with me in 12 months  Signed, Bruckner Verlin, MD 11/12/2024 10:25 AM    [facility] [address] [place], [place], [place]  [address] Phone: [phone]; Fax: [phone]  °"

## 2024-11-12 ENCOUNTER — Encounter: Payer: Self-pay | Admitting: Cardiovascular Disease

## 2024-11-12 ENCOUNTER — Ambulatory Visit: Attending: Cardiovascular Disease | Admitting: Cardiovascular Disease

## 2024-11-12 VITALS — BP 120/70 | HR 69 | Ht 69.0 in | Wt 171.0 lb

## 2024-11-12 DIAGNOSIS — I2510 Atherosclerotic heart disease of native coronary artery without angina pectoris: Secondary | ICD-10-CM | POA: Diagnosis present

## 2024-11-12 DIAGNOSIS — I480 Paroxysmal atrial fibrillation: Secondary | ICD-10-CM | POA: Diagnosis present

## 2024-11-12 DIAGNOSIS — E7800 Pure hypercholesterolemia, unspecified: Secondary | ICD-10-CM | POA: Insufficient documentation

## 2024-11-12 NOTE — Patient Instructions (Signed)
